# Patient Record
Sex: Female | Born: 1965 | Race: White | Hispanic: No | Marital: Married | Smoking: Former smoker
Health system: Southern US, Community
[De-identification: ages and names within clinical notes are randomized; demographics above are authoritative.]

## PROBLEM LIST (undated history)

## (undated) DIAGNOSIS — G4700 Insomnia, unspecified: Secondary | ICD-10-CM

## (undated) DIAGNOSIS — Z780 Asymptomatic menopausal state: Secondary | ICD-10-CM

## (undated) DIAGNOSIS — G8929 Other chronic pain: Secondary | ICD-10-CM

## (undated) DIAGNOSIS — F988 Other specified behavioral and emotional disorders with onset usually occurring in childhood and adolescence: Secondary | ICD-10-CM

## (undated) DIAGNOSIS — M549 Dorsalgia, unspecified: Principal | ICD-10-CM

## (undated) DIAGNOSIS — F329 Major depressive disorder, single episode, unspecified: Secondary | ICD-10-CM

## (undated) HISTORY — DX: Other chronic pain: G89.29

## (undated) HISTORY — DX: Asymptomatic menopausal state: Z78.0

## (undated) HISTORY — PX: OTHER SURGICAL HISTORY: SHX169

## (undated) HISTORY — DX: Dorsalgia, unspecified: M54.9

## (undated) HISTORY — DX: Major depressive disorder, single episode, unspecified: F32.9

## (undated) HISTORY — PX: BACK SURGERY: SHX140

## (undated) HISTORY — PX: BREAST SURGERY: SHX581

## (undated) HISTORY — PX: AUGMENTATION MAMMAPLASTY: SUR837

## (undated) HISTORY — PX: ABDOMINAL SURGERY: SHX537

## (undated) HISTORY — PX: TONSILLECTOMY: SUR1361

## (undated) HISTORY — DX: Insomnia, unspecified: G47.00

---

## 1998-05-21 ENCOUNTER — Emergency Department (HOSPITAL_COMMUNITY): Admission: EM | Admit: 1998-05-21 | Discharge: 1998-05-21 | Payer: Self-pay | Admitting: Emergency Medicine

## 2004-07-28 ENCOUNTER — Ambulatory Visit (HOSPITAL_COMMUNITY): Admission: RE | Admit: 2004-07-28 | Discharge: 2004-07-28 | Payer: Self-pay | Admitting: Family Medicine

## 2004-12-25 ENCOUNTER — Ambulatory Visit: Payer: Self-pay

## 2005-03-06 ENCOUNTER — Other Ambulatory Visit: Admission: RE | Admit: 2005-03-06 | Discharge: 2005-03-06 | Payer: Self-pay | Admitting: *Deleted

## 2005-10-19 ENCOUNTER — Ambulatory Visit: Payer: Self-pay | Admitting: Internal Medicine

## 2006-02-19 ENCOUNTER — Other Ambulatory Visit: Admission: RE | Admit: 2006-02-19 | Discharge: 2006-02-19 | Payer: Self-pay | Admitting: *Deleted

## 2006-06-05 ENCOUNTER — Ambulatory Visit: Payer: Self-pay | Admitting: General Practice

## 2007-03-25 ENCOUNTER — Other Ambulatory Visit: Admission: RE | Admit: 2007-03-25 | Discharge: 2007-03-25 | Payer: Self-pay | Admitting: *Deleted

## 2007-07-28 ENCOUNTER — Emergency Department (HOSPITAL_COMMUNITY): Admission: EM | Admit: 2007-07-28 | Discharge: 2007-07-29 | Payer: Self-pay | Admitting: Emergency Medicine

## 2008-03-24 ENCOUNTER — Other Ambulatory Visit: Admission: RE | Admit: 2008-03-24 | Discharge: 2008-03-24 | Payer: Self-pay | Admitting: Gynecology

## 2008-09-28 LAB — CBC AND DIFFERENTIAL
HCT: 43 % (ref 36–46)
Hemoglobin: 14.7 g/dL (ref 12.0–16.0)
WBC: 4.6 10^3/mL

## 2008-09-28 LAB — LIPID PANEL
Cholesterol: 181 mg/dL (ref 0–200)
LDL Cholesterol: 90 mg/dL

## 2008-09-28 LAB — THYROID PANEL WITH TSH
T3 Uptake: 42.6
T4, Total: 9.3

## 2008-09-28 LAB — HEPATIC FUNCTION PANEL
ALT: 12 U/L (ref 7–35)
AST: 15 U/L (ref 13–35)
Alkaline Phosphatase: 33 U/L (ref 25–125)
Bilirubin, Total: 0.4 mg/dL

## 2008-09-28 LAB — BASIC METABOLIC PANEL
BUN: 15 mg/dL (ref 4–21)
Glucose: 83 mg/dL
Potassium: 4.3 mmol/L (ref 3.4–5.3)
Sodium: 138 mmol/L (ref 137–147)

## 2008-09-28 LAB — TSH: TSH: 4.64 u[IU]/mL (ref 0.41–5.90)

## 2008-09-28 LAB — THYROID PEROXIDASE (TPO) AB: Thyroid Peroxidase (TPO) Ab: 50

## 2008-09-28 LAB — IRON
Ferritin: 26
Iron: 135

## 2008-09-28 LAB — CBC WITH DIFFERENTIAL
MCV: 93.5
RBC: 14.7

## 2010-04-30 ENCOUNTER — Ambulatory Visit: Payer: Self-pay | Admitting: Emergency Medicine

## 2010-04-30 LAB — CONVERTED CEMR LAB
Bilirubin Urine: NEGATIVE
Glucose, Urine, Semiquant: NEGATIVE
Ketones, urine, test strip: NEGATIVE
Nitrite: NEGATIVE
Protein, U semiquant: NEGATIVE
Specific Gravity, Urine: <1.005
Urobilinogen, UA: 0.2
WBC Urine, dipstick: NEGATIVE
pH: 5.5

## 2010-08-20 ENCOUNTER — Ambulatory Visit: Admit: 2010-08-20 | Payer: Self-pay | Admitting: Family Medicine

## 2010-08-20 ENCOUNTER — Ambulatory Visit
Admission: RE | Admit: 2010-08-20 | Discharge: 2010-08-20 | Payer: Self-pay | Source: Home / Self Care | Admitting: Family Medicine

## 2010-08-22 ENCOUNTER — Telehealth (INDEPENDENT_AMBULATORY_CARE_PROVIDER_SITE_OTHER): Payer: Self-pay | Admitting: *Deleted

## 2010-09-10 ENCOUNTER — Encounter: Payer: Self-pay | Admitting: Family Medicine

## 2010-09-21 NOTE — Progress Notes (Signed)
  Phone Note Outgoing Call   Call placed by: Clemens Catholic LPN,  August 22, 2010 10:30 AM Call placed to: Patient Summary of Call: call back: pt was not @ home spoke to a female who states that the pt was starting to feel better. told her if the pt has any questions or concerns to call back. Initial call taken by: Clemens Catholic LPN,  August 22, 2010 10:31 AM

## 2010-09-21 NOTE — Assessment & Plan Note (Signed)
Summary: FEVER,CHILLS,COUGH,SINUS PROBLEMS,SOB/WSE room 3   Vital Signs:  Patient Profile:   45 Years Old Female CC:      Cold & URI symptoms Height:     64 inches Weight:      149.75 pounds O2 treatment:    Room Air Temp:     100 degrees F oral Pulse rate:   87 / minute Resp:     18 per minute BP sitting:   100 / 63  (left arm) Cuff size:   regular  Vitals Entered By: Clemens Catholic LPN (August 20, 2010 1:20 PM)                  Updated Prior Medication List: AMBIEN 10 MG TABS (ZOLPIDEM TARTRATE)   Current Allergies: No known allergies History of Present Illness Chief Complaint: Cold & URI symptoms History of Present Illness:  Subjective: Patient complains of URI symptoms that started 4 days ago with ears clogged and mild sore throat.  Yesterday and today has developed a non-productive cough.  She often coughs until she gags.  She complains of pressure-like discomfort over anterior chest.   She has not had a tetanus shot in about 10 years. + cough worse at night No pleuritic pain No wheezing + mild nasal congestion + post-nasal drainage No sinus pain/pressure No itchy/red eyes No earache No hemoptysis No SOB + fever/chills No nausea No vomiting No abdominal pain No diarrhea No skin rashes + fatigue No myalgias No headache Used OTC meds without relief   REVIEW OF SYSTEMS Constitutional Symptoms       Complains of fever, chills, night sweats, and fatigue.     Denies weight loss and weight gain.  Eyes       Complains of eye pain.      Denies change in vision, eye discharge, glasses, contact lenses, and eye surgery. Ear/Nose/Throat/Mouth       Complains of sinus problems, sore throat, and hoarseness.      Denies hearing loss/aids, change in hearing, ear pain, ear discharge, dizziness, frequent runny nose, frequent nose bleeds, and tooth pain or bleeding.  Respiratory       Complains of wheezing and shortness of breath.      Denies dry cough, productive  cough, asthma, bronchitis, and emphysema/COPD.  Cardiovascular       Complains of chest pain and tires easily with exhertion.      Denies murmurs.    Gastrointestinal       Denies stomach pain, nausea/vomiting, diarrhea, constipation, blood in bowel movements, and indigestion. Genitourniary       Denies painful urination, kidney stones, and loss of urinary control. Neurological       Complains of headaches.      Denies paralysis, seizures, and fainting/blackouts. Musculoskeletal       Complains of decreased range of motion.      Denies muscle pain, joint pain, joint stiffness, redness, swelling, muscle weakness, and gout.  Skin       Denies bruising, unusual mles/lumps or sores, and hair/skin or nail changes.  Psych       Denies mood changes, temper/anger issues, anxiety/stress, speech problems, depression, and sleep problems. Other Comments: pt c/o fever, chills, cough, HA, loss of appetiite x 5 days. she took a ASA @ 11:45am today. she was seen at [facility] on thursday and was given flonase and cough med. but she states that she did not get them filled.   Past History:  Past Medical History:  Reviewed history from 04/30/2010 and no changes required. Fibroids cyst on uterus  Past Surgical History: Tonsillectomy Rhinoplasty Breast Augmentation fibroid cyst removed 2010 abdominoplasty 2009  Family History: Family History High cholesterol Family History Hypertension Family History of Prostate CA 1st degree relative <50 hemochromatosis diabetes  Social History: Reviewed history from 04/30/2010 and no changes required. Married Never Smoked Alcohol use-yes 1-2 glasses of wine a week Drug use-no Regular exercise-yes   Objective:  Appearance:  Patient appears healthy, stated age, and in no acute distress  Eyes:  Pupils are equal, round, and reactive to light and accomdation.  Extraocular movement is intact.  Conjunctivae are not inflamed.  Ears:  Canals normal.  Tympanic  membranes normal.   Nose:  Normal septum.  Normal turbinates, mildly congested.    No sinus tenderness present.  Pharynx:  Normal  Neck:  Supple.  Slightly tender shotty anterior/posterior nodes are palpated bilaterally.  Lungs:  Clear to auscultation.  Breath sounds are equal.  Chest:  Tenderness to palpation over sternum Heart:  Regular rate and rhythm without murmurs, rubs, or gallops.  Abdomen:  Nontender without masses or hepatosplenomegaly.  Bowel sounds are present.  No CVA or flank tenderness.  Extremities:  No edema.  Pedal pulses are full and equal.  Assessment New Problems: UPPER RESPIRATORY INFECTION, ACUTE (ICD-465.9)  VIRAL URI VS ? PERTUSSIS.  MILD COSTOCHONDRITIS ALSO  Plan New Medications/Changes: BENZONATATE 200 MG CAPS (BENZONATATE) One by mouth hs as needed cough  #12 x 0, 08/20/2010, Donna Christen MD AZITHROMYCIN 250 MG TABS (AZITHROMYCIN) Two tabs by mouth on day 1, then 1 tab daily on days 2 through 5  #6 tabs x 0, 08/20/2010, Donna Christen MD  New Orders: Est. Patient Level III [phone] Planning Comments:   Begin Z-pack, expectorant/decongestant, cough suppressant at bedtime.  Increase fluid intake Followup with PCP if not improving 10 to 14 days  Recommend Tdap when well (she has had a flu shot)   The patient and/or caregiver has been counseled thoroughly with regard to medications prescribed including dosage, schedule, interactions, rationale for use, and possible side effects and they verbalize understanding.  Diagnoses and expected course of recovery discussed and will return if not improved as expected or if the condition worsens. Patient and/or caregiver verbalized understanding.  Prescriptions: BENZONATATE 200 MG CAPS (BENZONATATE) One by mouth hs as needed cough  #12 x 0   Entered and Authorized by:   Donna Christen MD   Signed by:   Donna Christen MD on 08/20/2010   Method used:   Print then Give to Patient   RxID:   [id number] AZITHROMYCIN 250  MG TABS (AZITHROMYCIN) Two tabs by mouth on day 1, then 1 tab daily on days 2 through 5  #6 tabs x 0   Entered and Authorized by:   Donna Christen MD   Signed by:   Donna Christen MD on 08/20/2010   Method used:   Print then Give to Patient   RxID:   [id number]   Patient Instructions: 1)  May use Mucinex D (guaifenesin with decongestant) twice daily for congestion. 2)  Increase fluid intake, rest. 3)  May use Afrin nasal spray (or generic oxymetazoline) twice daily for about 5 days.  Also recommend using saline nasal spray several times daily and/or saline nasal irrigation. 4)  Followup with family doctor if not improving 10 to 14 days.  Orders Added: 1)  Est. Patient Level III [40102]

## 2010-09-21 NOTE — Assessment & Plan Note (Signed)
Summary: Frequent, burning urination x 6 dys rm 5   Vital Signs:  Patient Profile:   45 Years Old Female CC:      Frequent, burning urination x 6 dys LMP:     04/24/2010 Height:     64 inches Weight:      148 pounds O2 Sat:      100 % O2 treatment:    Room Air Temp:     97.9 degrees F oral Pulse rate:   50 / minute Pulse rhythm:   irregular Resp:     16 per minute BP sitting:   116 / 71  (left arm) Cuff size:   regular  Vitals Entered By: Areta Haber CMA (April 30, 2010 2:38 PM)  Menstrual History: LMP (date): 04/24/2010 LMP - Character: normal                  Current Allergies: No known allergies History of Present Illness History from: patient & spouse Chief Complaint: Frequent, burning urination x 6 dys History of Present Illness: Dysuria, burning x a few days.  Also with yeast infx last week, painful intercourse today, urgency.  No frequency.  Mild chills yesterday. Not using any OTC meds except last week to treat yeast infx.    Current Problems: VAGINITIS (ICD-616.10) URINARY TRACT INFECTION (ICD-599.0)   Current Meds BACTRIM DS 800-160 MG TABS (SULFAMETHOXAZOLE-TRIMETHOPRIM) 1 tab by mouth two times a day for 5 days FLUCONAZOLE 150 MG TABS (FLUCONAZOLE) 1 tab by mouth x1.  May repeat in 2-3 days if symptoms are not gone.  REVIEW OF SYSTEMS Constitutional Symptoms      Denies fever, chills, night sweats, weight loss, weight gain, and fatigue.  Eyes       Complains of glasses.      Denies change in vision, eye pain, eye discharge, contact lenses, and eye surgery. Ear/Nose/Throat/Mouth       Denies hearing loss/aids, change in hearing, ear pain, ear discharge, dizziness, frequent runny nose, frequent nose bleeds, sinus problems, sore throat, hoarseness, and tooth pain or bleeding.  Respiratory       Denies dry cough, productive cough, wheezing, shortness of breath, asthma, bronchitis, and emphysema/COPD.  Cardiovascular       Denies murmurs, chest  pain, and tires easily with exhertion.    Gastrointestinal       Denies stomach pain, nausea/vomiting, diarrhea, constipation, blood in bowel movements, and indigestion. Genitourniary       Complains of painful urination.      Denies kidney stones and loss of urinary control.      Comments: Frequent x 6 dys Neurological       Denies paralysis, seizures, and fainting/blackouts. Musculoskeletal       Denies muscle pain, joint pain, joint stiffness, decreased range of motion, redness, swelling, muscle weakness, and gout.  Skin       Denies bruising, unusual mles/lumps or sores, and hair/skin or nail changes.  Psych       Denies mood changes, temper/anger issues, anxiety/stress, speech problems, depression, and sleep problems. Other Comments: Pt has not seen PCP for this. Spouse has had Vasectomy.   Past History:  Past Medical History: Fibroids cyst on uterus  Past Surgical History: Tonsillectomy Rhinoplasty Breast Augmentation  Family History: Family History High cholesterol Family History Hypertension Family History of Prostate CA 1st degree relative <50  Social History: Married Never Smoked Alcohol use-yes 1-2 glasses of wine a week Drug use-no Regular exercise-yes Smoking Status:  never Drug Use:  no Does Patient Exercise:  yes Physical Exam General appearance: well developed, well nourished, no acute distress Chest/Lungs: no rales, wheezes, or rhonchi bilateral, breath sounds equal without effort Heart: regular rate and  rhythm, no murmur Abdomen: soft, non-tender without obvious organomegaly Back: no CVAT Skin: no obvious rashes or lesions MSE: oriented to time, place, and person Assessment New Problems: VAGINITIS (ICD-616.10) URINARY TRACT INFECTION (ICD-599.0)   Patient Education: Patient and/or caregiver instructed in the following: rest, fluids, Tylenol prn.  Plan New Medications/Changes: FLUCONAZOLE 150 MG TABS (FLUCONAZOLE) 1 tab by mouth x1.  May  repeat in 2-3 days if symptoms are not gone.  #2 x 0, 04/30/2010, Hoyt Koch MD BACTRIM DS 800-160 MG TABS (SULFAMETHOXAZOLE-TRIMETHOPRIM) 1 tab by mouth two times a day for 5 days  #10 x 0, 04/30/2010, Hoyt Koch MD  New Orders: UA Dipstick w/o Micro (automated)  [81003] New Patient Level III [99203] T-Culture, Urine [[id number]] Follow Up: Follow up with Primary Physician  The patient and/or caregiver has been counseled thoroughly with regard to medications prescribed including dosage, schedule, interactions, rationale for use, and possible side effects and they verbalize understanding.  Diagnoses and expected course of recovery discussed and will return if not improved as expected or if the condition worsens. Patient and/or caregiver verbalized understanding.  Prescriptions: FLUCONAZOLE 150 MG TABS (FLUCONAZOLE) 1 tab by mouth x1.  May repeat in 2-3 days if symptoms are not gone.  #2 x 0   Entered and Authorized by:   Hoyt Koch MD   Signed by:   Hoyt Koch MD on 04/30/2010   Method used:   Print then Give to Patient   RxID:   [id number] BACTRIM DS 800-160 MG TABS (SULFAMETHOXAZOLE-TRIMETHOPRIM) 1 tab by mouth two times a day for 5 days  #10 x 0   Entered and Authorized by:   Hoyt Koch MD   Signed by:   Hoyt Koch MD on 04/30/2010   Method used:   Print then Give to Patient   RxID:   [id number]   Orders Added: 1)  UA Dipstick w/o Micro (automated)  [81003] 2)  New Patient Level III [99203] 3)  T-Culture, Urine [[id number]]  Laboratory Results   Urine Tests  Date/Time Received: April 30, 2010 2:47 PM  Date/Time Reported: April 30, 2010 2:48 PM   Routine Urinalysis   Color: yellow Appearance: Clear Glucose: negative   (Normal Range: Negative) Bilirubin: negative   (Normal Range: Negative) Ketone: negative   (Normal Range: Negative) Spec. Gravity: <1.005   (Normal Range: 1.003-1.035) Blood: trace-lysed    (Normal Range: Negative) pH: 5.5   (Normal Range: 5.0-8.0) Protein: negative   (Normal Range: Negative) Urobilinogen: 0.2   (Normal Range: 0-1) Nitrite: negative   (Normal Range: Negative) Leukocyte Esterace: negative   (Normal Range: Negative)

## 2011-03-20 ENCOUNTER — Inpatient Hospital Stay (INDEPENDENT_AMBULATORY_CARE_PROVIDER_SITE_OTHER)
Admission: RE | Admit: 2011-03-20 | Discharge: 2011-03-20 | Disposition: A | Discharge status: Home / Self Care | Payer: Self-pay | Source: Ambulatory Visit | Attending: Emergency Medicine | Admitting: Emergency Medicine

## 2011-03-20 ENCOUNTER — Encounter: Payer: Self-pay | Admitting: Emergency Medicine

## 2011-03-20 DIAGNOSIS — S91009A Unspecified open wound, unspecified ankle, initial encounter: Secondary | ICD-10-CM

## 2011-03-20 DIAGNOSIS — S81009A Unspecified open wound, unspecified knee, initial encounter: Secondary | ICD-10-CM

## 2011-03-20 DIAGNOSIS — S81809A Unspecified open wound, unspecified lower leg, initial encounter: Secondary | ICD-10-CM

## 2011-05-28 LAB — URINALYSIS, ROUTINE W REFLEX MICROSCOPIC
Bilirubin Urine: NEGATIVE
Glucose, UA: NEGATIVE
Hgb urine dipstick: NEGATIVE
Ketones, ur: NEGATIVE
Nitrite: NEGATIVE
Protein, ur: NEGATIVE
Specific Gravity, Urine: 1.017
Urobilinogen, UA: 0.2
pH: 5

## 2011-05-28 LAB — PREGNANCY, URINE: Preg Test, Ur: NEGATIVE

## 2011-07-23 NOTE — Progress Notes (Signed)
Summary: LEG PAIN W/FEVER & POSS INFECTION/TJ   Vital Signs:  Patient Profile:   45 Years Old Female CC:      cut on left leg, possible infection x 9 days Height:     64 inches Weight:      149 pounds O2 Sat:      96 % O2 treatment:    Room Air Temp:     98.2 degrees F oral Pulse rate:   50 / minute Resp:     16 per minute BP sitting:   103 / 65  (left arm) Cuff size:   regular  Vitals Entered By: Clemens Catholic LPN (March 20, 2011 10:08 AM)                  Updated Prior Medication List: No Medications Current Allergies (reviewed today): No known allergies History of Present Illness History from: patient & husband Chief Complaint: cut on left leg, possible infection x 9 days History of Present Illness: 9 days ago [place] on the [facility] and she scraped her L shin on a rock.  It didn't bleed much, and since they have kept it clean.  It is healing up well.  However she woke up today and had subjective F/C and got concerned that she had an infection.  Did have mild drainage last week from the wound.   REVIEW OF SYSTEMS Constitutional Symptoms       Complains of chills.     Denies fever, night sweats, weight loss, weight gain, and fatigue.  Eyes       Denies change in vision, eye pain, eye discharge, glasses, contact lenses, and eye surgery. Ear/Nose/Throat/Mouth       Denies hearing loss/aids, change in hearing, ear pain, ear discharge, dizziness, frequent runny nose, frequent nose bleeds, sinus problems, sore throat, hoarseness, and tooth pain or bleeding.  Respiratory       Denies dry cough, productive cough, wheezing, shortness of breath, asthma, bronchitis, and emphysema/COPD.  Cardiovascular       Denies murmurs, chest pain, and tires easily with exhertion.    Gastrointestinal       Denies stomach pain, nausea/vomiting, diarrhea, constipation, blood in bowel movements, and indigestion. Genitourniary       Denies painful urination, kidney stones, and loss of  urinary control. Neurological       Complains of headaches.      Denies paralysis, seizures, and fainting/blackouts. Musculoskeletal       Complains of redness and swelling.      Denies muscle pain, joint pain, joint stiffness, decreased range of motion, muscle weakness, and gout.  Skin       Denies bruising, unusual mles/lumps or sores, and hair/skin or nail changes.  Psych       Denies mood changes, temper/anger issues, anxiety/stress, speech problems, depression, and sleep problems. Other Comments: pt states that she [place] 9 [place]. she c/o chills, ? fever x today.    Past History:  Past Medical History: Reviewed history from 04/30/2010 and no changes required. Fibroids cyst on uterus  Past Surgical History: Reviewed history from 08/20/2010 and no changes required. Tonsillectomy Rhinoplasty Breast Augmentation fibroid cyst removed 2010 abdominoplasty 2009  Family History: Reviewed history from 08/20/2010 and no changes required. Family History High cholesterol Family History Hypertension Family History of Prostate CA 1st degree relative <50 hemochromatosis diabetes  Social History: Reviewed history from 04/30/2010 and no changes required. Married Never  Smoked Alcohol use-yes 1-2 glasses of wine a week Drug use-no Regular exercise-yes Physical Exam General appearance: well developed, well nourished, no acute distress MSE: oriented to time, place, and person L anterior mid-shin is a healing vertical wound approx 4cm with pink surrounding granulation tissue and a scar over the wound.  It doesn't necessarily look infected, is not warm, no drainage, minimal tenderness.  Distal pulses and sensation is normal.  Normal gait. Assessment New Problems: WOUND, OPEN, LEG, WITHOUT COMPLICATION (ICD-891.0)   Plan New Medications/Changes: DOXYCYCLINE HYCLATE 100 MG CAPS (DOXYCYCLINE HYCLATE) 1 by mouth two times a day for 10 days  #20 x 0,  03/20/2011, Hoyt Koch MD  New Orders: Est. Patient Level II [phone] Planning Comments:   L leg wound, possibly small amount of cellulitis, but likely just granulation tissue and healing well.  Will give Rx for Doxy since they are concerned with infection.  Caution with photosensitivity.  Keep clean and dry.  If worsening symptoms such as F/C, pus, drainage, more pain, will need to follow up with her PCP.   The patient and/or caregiver has been counseled thoroughly with regard to medications prescribed including dosage, schedule, interactions, rationale for use, and possible side effects and they verbalize understanding.  Diagnoses and expected course of recovery discussed and will return if not improved as expected or if the condition worsens. Patient and/or caregiver verbalized understanding.  Prescriptions: DOXYCYCLINE HYCLATE 100 MG CAPS (DOXYCYCLINE HYCLATE) 1 by mouth two times a day for 10 days  #20 x 0   Entered and Authorized by:   Hoyt Koch MD   Signed by:   Hoyt Koch MD on 03/20/2011   Method used:   Print then Give to Patient   RxID:   [id number]   Orders Added: 1)  Est. Patient Level II [95621]

## 2012-01-10 ENCOUNTER — Encounter: Payer: Self-pay | Admitting: Emergency Medicine

## 2012-01-10 ENCOUNTER — Emergency Department (INDEPENDENT_AMBULATORY_CARE_PROVIDER_SITE_OTHER)
Admission: EM | Admit: 2012-01-10 | Discharge: 2012-01-10 | Disposition: A | Discharge status: Home / Self Care | Payer: 59 | Source: Home / Self Care | Attending: Emergency Medicine | Admitting: Emergency Medicine

## 2012-01-10 DIAGNOSIS — J029 Acute pharyngitis, unspecified: Secondary | ICD-10-CM

## 2012-01-10 LAB — POCT RAPID STREP A (OFFICE): Rapid Strep A Screen: NEGATIVE

## 2012-01-10 NOTE — ED Notes (Signed)
Sore throat started today

## 2012-01-10 NOTE — ED Provider Notes (Signed)
History     CSN: [id number]  Arrival date & time 01/10/12  1607   First MD Initiated Contact with Patient 01/10/12 1610      Chief Complaint  Patient presents with  . Sore Throat    (Consider location/radiation/quality/duration/timing/severity/associated sxs/prior treatment) HPI Renee Norris is a 46 y.o. female who complains of onset of cold symptoms for 1 day.  The symptoms are constant and mild-moderate in severity.  She has recently been around a friend who possibly had strep throat and would like to get checked for that. + sore throat No cough No pleuritic pain No wheezing No nasal congestion + post-nasal drainage No sinus pain/pressure No chest congestion No itchy/red eyes No earache No hemoptysis No SOB No chills/sweats No fever No nausea No vomiting No abdominal pain No diarrhea No skin rashes + fatigue No myalgias No headache    History reviewed. No pertinent past medical history.  Past Surgical History  Procedure Date  . Breast surgery   . Tonsillectomy   . Gyn surgery     Family History  Problem Relation Age of Onset  . Diabetes Mother   . Hypertension Mother   . Diabetes Father   . Hypertension Father     History  Substance Use Topics  . Smoking status: Former Games developer  . Smokeless tobacco: Not on file  . Alcohol Use: 0.6 oz/week    1 Glasses of wine per week    OB History    Grav Para Term Preterm Abortions TAB SAB Ect Mult Living                  Review of Systems  All other systems reviewed and are negative.    Allergies  Review of patient's allergies indicates no known allergies.  Home Medications   Current Outpatient Rx  Name Route Sig Dispense Refill  . ZOLPIDEM TARTRATE 5 MG PO TABS Oral Take 5 mg by mouth at bedtime as needed.      BP 100/65  Pulse 61  Temp(Src) 97.9 F (36.6 C) (Oral)  Resp 12  Ht 5\' 4"  (1.626 m)  Wt 155 lb (70.308 kg)  BMI 26.61 kg/m2  SpO2 97%  Physical Exam  Nursing note and vitals  reviewed. Constitutional: She is oriented to person, place, and time. She appears well-developed and well-nourished.  HENT:  Head: Normocephalic and atraumatic.  Right Ear: Tympanic membrane, external ear and ear canal normal.  Left Ear: Tympanic membrane, external ear and ear canal normal.  Nose: Mucosal edema present.  Mouth/Throat: No oropharyngeal exudate, posterior oropharyngeal edema or posterior oropharyngeal erythema.         Moderate cerumen right ear  Eyes: No scleral icterus.  Neck: Neck supple.  Cardiovascular: Regular rhythm and normal heart sounds.   Pulmonary/Chest: Effort normal and breath sounds normal. No respiratory distress.  Neurological: She is alert and oriented to person, place, and time.  Skin: Skin is warm and dry.  Psychiatric: She has a normal mood and affect. Her speech is normal.    ED Course  Procedures (including critical care time)   Labs Reviewed  POCT RAPID STREP A (OFFICE)  STREP A DNA PROBE   No results found.   1. Acute pharyngitis       MDM  1)  rapid strep test is negative.  Throat culture is pending.  Patient to call back if developing worse symptoms we can call in an antibiotic at that time. 2)  Use nasal saline solution (over the  counter) at least 3 times a day. 3)  Use over the counter decongestants like Zyrtec-D every 12 hours as needed to help with congestion.  If you have hypertension, do not take medicines with sudafed.  4)  Can take tylenol every 6 hours or motrin every 8 hours for pain or fever. 5)  Follow up with your primary doctor if no improvement in 5-7 days, sooner if increasing pain, fever, or new symptoms.     Marlaine Hind, MD 01/10/12 [phone]

## 2012-01-11 LAB — STREP A DNA PROBE: GASP: NEGATIVE

## 2012-01-12 ENCOUNTER — Telehealth: Payer: Self-pay | Admitting: Emergency Medicine

## 2012-01-31 ENCOUNTER — Emergency Department (INDEPENDENT_AMBULATORY_CARE_PROVIDER_SITE_OTHER)
Admission: EM | Admit: 2012-01-31 | Discharge: 2012-01-31 | Disposition: A | Discharge status: Home / Self Care | Payer: 59 | Source: Home / Self Care | Attending: Emergency Medicine | Admitting: Emergency Medicine

## 2012-01-31 ENCOUNTER — Encounter: Payer: Self-pay | Admitting: *Deleted

## 2012-01-31 DIAGNOSIS — J029 Acute pharyngitis, unspecified: Secondary | ICD-10-CM

## 2012-01-31 LAB — POCT RAPID STREP A (OFFICE): Rapid Strep A Screen: NEGATIVE

## 2012-01-31 MED ORDER — AZITHROMYCIN 250 MG PO TABS: ORAL_TABLET | ORAL | Status: AC

## 2012-01-31 NOTE — ED Provider Notes (Signed)
History     CSN: [id number]  Arrival date & time 01/31/12  1478   First MD Initiated Contact with Patient 01/31/12 [phone]      Chief Complaint  Patient presents with  . Sore Throat    (Consider location/radiation/quality/duration/timing/severity/associated sxs/prior treatment) HPI Renee Norris is a 46 y.o. female who complains of onset of ST.  Was sick 2 weeks ago, neg strep, got better after a week.  Then 4 days later, symptoms returned and are worse.  The symptoms are constant and mild-moderate in severity. + sore throat No cough No pleuritic pain No wheezing No nasal congestion No post-nasal drainage No sinus pain/pressure No chest congestion No itchy/red eyes No earache No hemoptysis No SOB No chills/sweats + fever No nausea No vomiting No abdominal pain No diarrhea No skin rashes + fatigue + myalgias + headache    History reviewed. No pertinent past medical history.  Past Surgical History  Procedure Date  . Breast surgery   . Tonsillectomy   . Gyn surgery     Family History  Problem Relation Age of Onset  . Diabetes Mother   . Hypertension Mother   . Diabetes Father   . Hypertension Father     History  Substance Use Topics  . Smoking status: Former Games developer  . Smokeless tobacco: Not on file  . Alcohol Use: 0.6 oz/week    1 Glasses of wine per week    OB History    Grav Para Term Preterm Abortions TAB SAB Ect Mult Living                  Review of Systems  All other systems reviewed and are negative.    Allergies  Review of patient's allergies indicates no known allergies.  Home Medications   Current Outpatient Rx  Name Route Sig Dispense Refill  . AZITHROMYCIN 250 MG PO TABS  Use as directed 1 each 0  . ZOLPIDEM TARTRATE 5 MG PO TABS Oral Take 5 mg by mouth at bedtime as needed.      BP 112/74  Pulse 83  Temp 99 F (37.2 C) (Oral)  Resp 16  Ht 5' 4.5" (1.638 m)  Wt 154 lb (69.854 kg)  BMI 26.03 kg/m2  SpO2 100%  LMP  01/10/2012  Physical Exam  Nursing note and vitals reviewed. Constitutional: She is oriented to person, place, and time. She appears well-developed and well-nourished.  HENT:  Head: Normocephalic and atraumatic.  Right Ear: Tympanic membrane, external ear and ear canal normal.  Left Ear: Tympanic membrane, external ear and ear canal normal.  Nose: Nose normal.  Mouth/Throat: Posterior oropharyngeal erythema present. No oropharyngeal exudate or posterior oropharyngeal edema.       S/p tonsillectomy  Eyes: No scleral icterus.  Neck: Neck supple.  Cardiovascular: Regular rhythm and normal heart sounds.   Pulmonary/Chest: Effort normal and breath sounds normal. No respiratory distress.  Neurological: She is alert and oriented to person, place, and time.  Skin: Skin is warm and dry.  Psychiatric: She has a normal mood and affect. Her speech is normal.    ED Course  Procedures (including critical care time)   Labs Reviewed  POCT RAPID STREP A (OFFICE)   No results found.   1. Acute pharyngitis       MDM  1)  Take the prescribed antibiotic as instructed. Rapid strep negative. 2)  Use nasal saline solution (over the counter) at least 3 times a day. 3)  Use over the counter  decongestants like Zyrtec-D every 12 hours as needed to help with congestion.  If you have hypertension, do not take medicines with sudafed.  4)  Can take tylenol every 6 hours or motrin every 8 hours for pain or fever. 5)  Follow up with your primary doctor if no improvement in 5-7 days, sooner if increasing pain, fever, or new symptoms.     Marlaine Hind, MD 01/31/12 [phone]

## 2012-01-31 NOTE — ED Notes (Signed)
Patient c/o sore throat and fatigue x 2 weeks.

## 2012-09-08 ENCOUNTER — Ambulatory Visit (HOSPITAL_BASED_OUTPATIENT_CLINIC_OR_DEPARTMENT_OTHER)
Admit: 2012-09-08 | Discharge: 2012-09-08 | Disposition: A | Discharge status: Home / Self Care | Payer: 59 | Source: Ambulatory Visit | Attending: Family Medicine | Admitting: Family Medicine

## 2012-09-08 ENCOUNTER — Encounter: Payer: Self-pay | Admitting: Emergency Medicine

## 2012-09-08 ENCOUNTER — Emergency Department
Admission: EM | Admit: 2012-09-08 | Discharge: 2012-09-08 | Disposition: A | Discharge status: Home / Self Care | Payer: 59 | Source: Home / Self Care | Attending: Family Medicine | Admitting: Family Medicine

## 2012-09-08 DIAGNOSIS — M549 Dorsalgia, unspecified: Secondary | ICD-10-CM

## 2012-09-08 DIAGNOSIS — R109 Unspecified abdominal pain: Secondary | ICD-10-CM | POA: Insufficient documentation

## 2012-09-08 DIAGNOSIS — R339 Retention of urine, unspecified: Secondary | ICD-10-CM | POA: Insufficient documentation

## 2012-09-08 DIAGNOSIS — R5381 Other malaise: Secondary | ICD-10-CM

## 2012-09-08 DIAGNOSIS — R5383 Other fatigue: Secondary | ICD-10-CM

## 2012-09-08 LAB — POCT URINALYSIS DIP (MANUAL ENTRY)
Bilirubin, UA: NEGATIVE
Glucose, UA: NEGATIVE
Ketones, POC UA: NEGATIVE
Leukocytes, UA: NEGATIVE
Nitrite, UA: NEGATIVE
Protein Ur, POC: NEGATIVE
Spec Grav, UA: <=1.005 (ref 1.005–1.03)
Urobilinogen, UA: 0.2 (ref 0–1)
pH, UA: 5.5 (ref 5–8)

## 2012-09-08 MED ORDER — IOHEXOL 300 MG/ML  SOLN
100.0000 mL | Freq: Once | INTRAMUSCULAR | Status: AC | PRN
  Administered 2012-09-08: 100 mL via INTRAVENOUS

## 2012-09-08 NOTE — ED Provider Notes (Signed)
History     CSN: [id number]  Arrival date & time 09/08/12  1203   First MD Initiated Contact with Patient 09/08/12 1207      Chief Complaint  Patient presents with  . Fatigue  . Mass  . Generalized Body Aches  . Urinary Retention   HPI This is a 47 year old otherwise healthy female presents today with multiple nonspecific complaints have been present over the past 3 months. Predominantly patient states she's had decreased urine output where she's only been taking up to twice per day. Patient states a hair fluid drinking has not changed. She has been drinking up to a gallon of water per day and. Patient states that prior to 3 months ago she was peeing up to 4-5 times per day. 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 Patient denies any new medications. Patient does report that she does have a very high amount of tobacco exposure at her job. Patient also reports some right-sided lower back pain has been present for the same time frame. Back pain is predominantly in the paraspinal area and is not related to position, but rather is constant with no radicular component. Patient does report some generalized fatigue over the same time frame. Patient denies any night sweats, fevers chills or unintentional weight loss. Patient does report a family history of pancreatic cancer in her father. Patient also reports that her mood has been depressed over the past few months and this is been work related. Patient feels that she has been under high amount of stress at work. Patient also reports a prior history of depression that she's currently not being treated for. Patient denies any homicidal or suicidal ideations currently. Patient is unsure if current symptoms are related to mood and stress. From a symptomatic standpoint patient denies any headache, loss of vision, hemiparesis or confusion,  shortness of breath or chest pain. Patient denies any change in bowel habits. No diarrhea, bloody stools, no dysuria. Patient has noticed some mild lotion the swelling and generalized bloating over the same time frame that improves with urination per patient. Patient also reports increased menses over the past 2 months with having up to 2 mattress cycle per month. Patient states she has not seen her gynecologist about this issue. Patient does report a prior history of ovarian cysts that have been removed surgically removed in the past. Notable medical history is most significant for surgical procedures including breast augmentation, Maisie Fus a, tonsillectomy, and ovarian cyst removal. Patient states that she has not spoken to her primary care doctor or her gynecologist about these issues. History reviewed. No pertinent past medical history.  Past Surgical History  Procedure Date  . Breast surgery   . Tonsillectomy   . Gyn surgery   . Abdominal surgery     Family History  Problem Relation Age of Onset  . Diabetes Mother   . Hypertension Mother   . Diabetes Father   . Hypertension Father     History  Substance Use Topics  . Smoking status: Former Games developer  . Smokeless tobacco: Not on file  . Alcohol Use: No    OB History    Grav Para Term Preterm Abortions TAB SAB Ect Mult Living                  Review of Systems Complete 12 point review systems negative except  as noted above in history of present illness. Allergies  Review of patient's allergies indicates no known allergies.  Home Medications   Current Outpatient Rx  Name  Route  Sig  Dispense  Refill  . ASPIRIN 81 MG PO TABS   Oral   Take 81 mg by mouth daily.         Marland Kitchen ZOLPIDEM TARTRATE 5 MG PO TABS   Oral   Take 5 mg by mouth at bedtime as needed.           BP 114/72  Pulse 60  Temp 97.6 F (36.4 C) (Oral)  Resp 16  Ht 5' 4.5" (1.638 m)  Wt 158 lb (71.668 kg)  BMI 26.70 kg/m2  SpO2 100%  LMP  08/22/2012  Physical Exam  Constitutional: She appears well-developed and well-nourished. No distress.  HENT:  Head: Normocephalic and atraumatic.  Right Ear: External ear normal.  Left Ear: External ear normal.  Eyes: Conjunctivae normal are normal. Pupils are equal, round, and reactive to light.  Neck: Normal range of motion. Neck supple.  Cardiovascular: Normal rate, regular rhythm and normal heart sounds.   No murmur heard. Pulmonary/Chest: Effort normal and breath sounds normal. She has no wheezes. She has no rales.  Abdominal: Soft.       Mild abdominal TTP diffusely across abdomen.    Musculoskeletal:       Arms: Lymphadenopathy:    She has no cervical adenopathy.  Neurological: She is alert.  Skin: Skin is warm.    ED Course  Procedures (including critical care time)   Labs Reviewed  POCT URINALYSIS DIP (MANUAL ENTRY)  POCT CBC W AUTO DIFF ([facility])  COMPREHENSIVE METABOLIC PANEL  TSH  SEDIMENTATION RATE  PRO B NATRIURETIC PEPTIDE  VITAMIN B12  VITAMIN D 1,25 DIHYDROXY   No results found.   1. Abdominal  pain, other specified site   2. Fatigue   3. Back pain       MDM  Overall, symptoms are fairly broad and nonspecific. However, given abdominal pain and current complaints will obtain a CT of the abdomen and pelvis with IV and oral contrast to better assess abdominal and pelvic anatomy. We'll also obtain a lumbar spine to evaluate low back pain. We'll check baseline labs including CBC, CMET, TSH, BNP, sedimentation rate, vitamin D and vitamin B12 level. I suspect there may be a strong component of depression/stress related to patient symptoms as onset of symptoms seem to be correlated with work and worsening stress at work as well as prior history of depression that is currently not being treated.  Discussed with patient followup with PCP about this issue. Also discussed followup with her gynecologist about increased menstruation as well as  urology follow for decreased urine output. We'll hold on treatment in the interim as patient is overall well-appearing pending further workup. General red flags were discussed.    The patient and/or caregiver has been counseled thoroughly with regard to treatment plan and/or medications prescribed including dosage, schedule, interactions, rationale for use, and possible side effects and they verbalize understanding. Diagnoses and expected course of recovery discussed and will return if not improved as expected or if the condition worsens. Patient and/or caregiver verbalized understanding.             Doree Albee, MD 09/08/12 1341

## 2012-09-08 NOTE — ED Notes (Signed)
Patient has assorted concerns: severe fatigue; a lump on right lower leg that is not resolving post injury; urine output not equivalent to p.o. Intake; general aches and some dizziness.

## 2012-09-09 ENCOUNTER — Telehealth: Payer: Self-pay | Admitting: Family Medicine

## 2012-09-09 LAB — PRO B NATRIURETIC PEPTIDE: Pro B Natriuretic peptide (BNP): 47.98 pg/mL (ref <126)

## 2012-09-09 LAB — COMPREHENSIVE METABOLIC PANEL
ALT: 13 U/L (ref 0–35)
AST: 21 U/L (ref 0–37)
Albumin: 4.3 g/dL (ref 3.5–5.2)
Alkaline Phosphatase: 39 U/L (ref 39–117)
BUN: 13 mg/dL (ref 6–23)
CO2: 21 mEq/L (ref 19–32)
Calcium: 9.1 mg/dL (ref 8.4–10.5)
Chloride: 105 mEq/L (ref 96–112)
Creat: 0.79 mg/dL (ref 0.50–1.10)
Glucose, Bld: 79 mg/dL (ref 70–99)
Potassium: 4.4 mEq/L (ref 3.5–5.3)
Sodium: 138 mEq/L (ref 135–145)
Total Bilirubin: 0.3 mg/dL (ref 0.3–1.2)
Total Protein: 6.7 g/dL (ref 6.0–8.3)

## 2012-09-09 LAB — TSH: TSH: 2.287 u[IU]/mL (ref 0.350–4.500)

## 2012-09-09 LAB — SEDIMENTATION RATE: Sed Rate: 9 mm/hr (ref 0–22)

## 2012-09-09 LAB — VITAMIN B12: Vitamin B-12: 370 pg/mL (ref 211–911)

## 2012-09-09 NOTE — ED Notes (Signed)
Called and spoke with pt about lab and imaging results thus far.   Ct Abdomen Pelvis W Contrast  09/08/2012  *RADIOLOGY REPORT*  Clinical Data: Back pain and urinary retention.  CT ABDOMEN AND PELVIS WITH CONTRAST  Technique:  Multidetector CT imaging of the abdomen and pelvis was performed following the standard protocol during bolus administration of intravenous contrast.  Contrast: OMNIPAQUE IOHEXOL 300 MG/ML  SOLN  Comparison: None.  Findings: Lung bases demonstrate mild dependent atelectasis.  No pleural or pericardial effusion.  Breast implants are noted.  Heart size is normal.  The gallbladder, liver, spleen, adrenal glands, spleen, pancreas, biliary tree and kidneys appear normal.  The stomach, small and large bowel and appendix appear normal.  Uterus and adnexa are unremarkable.  Small uterine fibroids seen on comparison ultrasound are not well demonstrated today.  There is no lymphadenopathy or fluid.  No focal bony abnormality.  IMPRESSION: Negative exam.   Original Report Authenticated By: Holley Dexter, M.D.    Also discussed lab results negative thus far.  Instructed pt to follow up with Gyn and PCP. If urinary sxs return or persist, follow up with urology. Pt expressed understanding.      Doree Albee, MD 09/09/12 [phone]

## 2012-09-12 LAB — VITAMIN D 1,25 DIHYDROXY
Vitamin D 1, 25 (OH)2 Total: 49 pg/mL (ref 18–72)
Vitamin D2 1, 25 (OH)2: <8 pg/mL
Vitamin D3 1, 25 (OH)2: 49 pg/mL

## 2015-01-05 DIAGNOSIS — Z0000 Encounter for general adult medical examination without abnormal findings: Secondary | ICD-10-CM | POA: Insufficient documentation

## 2015-02-03 LAB — HM COLONOSCOPY

## 2016-01-11 ENCOUNTER — Other Ambulatory Visit: Payer: Self-pay | Admitting: Orthopaedic Surgery

## 2016-01-11 DIAGNOSIS — M545 Low back pain: Secondary | ICD-10-CM

## 2016-01-15 ENCOUNTER — Ambulatory Visit
Admission: RE | Admit: 2016-01-15 | Discharge: 2016-01-15 | Disposition: A | Discharge status: Home / Self Care | Payer: Self-pay | Source: Ambulatory Visit | Attending: Orthopaedic Surgery | Admitting: Orthopaedic Surgery

## 2016-01-15 DIAGNOSIS — M545 Low back pain: Secondary | ICD-10-CM

## 2016-01-20 ENCOUNTER — Emergency Department
Admission: EM | Admit: 2016-01-20 | Discharge: 2016-01-20 | Disposition: A | Discharge status: Home / Self Care | Payer: BLUE CROSS/BLUE SHIELD | Source: Home / Self Care | Attending: Family Medicine | Admitting: Family Medicine

## 2016-01-20 DIAGNOSIS — R51 Headache: Secondary | ICD-10-CM

## 2016-01-20 DIAGNOSIS — R519 Headache, unspecified: Secondary | ICD-10-CM

## 2016-01-20 DIAGNOSIS — R112 Nausea with vomiting, unspecified: Secondary | ICD-10-CM

## 2016-01-20 MED ORDER — DEXAMETHASONE SODIUM PHOSPHATE 10 MG/ML IJ SOLN
10.0000 mg | Freq: Once | INTRAMUSCULAR | Status: AC
  Administered 2016-01-20: 10 mg via INTRAMUSCULAR

## 2016-01-20 MED ORDER — ONDANSETRON HCL 4 MG PO TABS
4.0000 mg | ORAL_TABLET | Freq: Once | ORAL | Status: AC
  Administered 2016-01-20: 4 mg via ORAL

## 2016-01-20 MED ORDER — ONDANSETRON 4 MG PO TBDP: ORAL_TABLET | ORAL | Status: DC

## 2016-01-20 NOTE — ED Provider Notes (Signed)
CSN: [id number]     Arrival date & time 01/20/16  1601 History   First MD Initiated Contact with Patient 01/20/16 1635     Chief Complaint  Patient presents with  . Headache   (Consider location/radiation/quality/duration/timing/severity/associated sxs/prior Treatment) HPI  The pt is a 50yo [facility] with [facility]/o Right sided headache that is aching and sharp, worse behind Right eye, associated nausea, vomiting x4 and 2-3 episodes of loose stool since around 9AM.  She has been able to take ibuprofen around 10AM and aspirin at 3PM but vomited Gatorade earlier today.  She reports starting on hormone replacement about 3 weeks ago and recalls having similar headaches when she use to have her menstrual cycle.  Denies recent head injury. Denies weakness or numbness.    History reviewed. No pertinent past medical history. Past Surgical History  Procedure Laterality Date  . Breast surgery    . Tonsillectomy    . Gyn surgery    . Abdominal surgery     Family History  Problem Relation Age of Onset  . Diabetes Mother   . Hypertension Mother   . Cancer Mother   . Diabetes Father   . Hypertension Father   . Cancer Father    Social History  Substance Use Topics  . Smoking status: Former Games developermoker  . Smokeless tobacco: None  . Alcohol Use: No     Comment: 2-3 glasses of wine a day   OB History    No data available     Review of Systems  Constitutional: Negative for fever and chills.  HENT: Negative for congestion, ear pain, sore throat, trouble swallowing and voice change.   Eyes: Positive for photophobia. Negative for pain, discharge, redness, itching and visual disturbance.  Respiratory: Negative for cough and shortness of breath.   Cardiovascular: Negative for chest pain and palpitations.  Gastrointestinal: Positive for nausea, vomiting and diarrhea ( "loose stool"). Negative for abdominal pain.  Musculoskeletal: Negative for myalgias, back pain and arthralgias.  Skin:  Negative for rash.  Neurological: Positive for headaches. Negative for dizziness, seizures, syncope, weakness, light-headedness and numbness.    Allergies  Review of patient's allergies indicates no known allergies.  Home Medications   Prior to Admission medications   Medication Sig Start Date End Date Taking? Authorizing Provider  aspirin 81 MG tablet Take 81 mg by mouth daily.    Historical Provider, MD  ondansetron (ZOFRAN ODT) 4 MG disintegrating tablet 4mg  ODT q4 hours prn nausea/vomit 01/20/16   Junius FinnerErin O'Malley, PA-C  zolpidem (AMBIEN) 5 MG tablet Take 5 mg by mouth at bedtime as needed.    Historical Provider, MD   Meds Ordered and Administered this Visit   Medications  ondansetron (ZOFRAN) tablet 4 mg (4 mg Oral Given 01/20/16 1630)  dexamethasone (DECADRON) injection 10 mg (10 mg Intramuscular Given 01/20/16 1653)    BP 108/70 mmHg  Pulse 57  Temp(Src) 98.3 F (36.8 C) (Oral)  Ht 5\' 4"  (1.626 m)  Wt 158 lb (71.668 kg)  BMI 27.11 kg/m2  SpO2 96%  LMP 04/21/2014 (Approximate) No data found.   Physical Exam  Constitutional: She is oriented to person, place, and time. She appears well-developed and well-nourished. No distress.  HENT:  Head: Normocephalic and atraumatic.  Right Ear: Tympanic membrane normal.  Left Ear: Tympanic membrane normal.  Nose: Nose normal.  Mouth/Throat: Uvula is midline, oropharynx is clear and moist and mucous membranes are normal.  Eyes: Conjunctivae and EOM are normal. Pupils are equal,  round, and reactive to light. No scleral icterus.  Neck: Normal range of motion. Neck supple.  Cardiovascular: Normal rate, regular rhythm and normal heart sounds.   Pulmonary/Chest: Effort normal and breath sounds normal. No respiratory distress. She has no wheezes. She has no rales.  Abdominal: Soft. She exhibits no distension. There is no tenderness.  Musculoskeletal: Normal range of motion.  Neurological: She is alert and oriented to person, place, and time.  No cranial nerve deficit. Coordination normal.  CN II-XII in tact. Speech is clear. Alert to person place and time. Normal coordination. Normal gait.  Skin: Skin is warm and dry. She is not diaphoretic.  Psychiatric: She has a normal mood and affect. Her behavior is normal.  Nursing note and vitals reviewed.   ED Course  Procedures (including critical care time)  Labs Review Labs Reviewed - No data to display  Imaging Review No results found.  MDM   1. Right-sided headache   2. Non-intractable vomiting with nausea, unspecified vomiting type    Pt c/o HA c/o prior headaches when she had menstrual cycles.  No focal neuro deficit.  Pt did have aspirin "1 regular size one" at 3PM  Tx in UC: Zofran  ODT, Decadron  IM  Pt given diet Coke to drink and allowed to rest in cool darkened room.  Headache resolved, improving from 4/10 to 0/10. She feels comfortable being discharged home. Rx: Zofran Encouraged to f/u with PCP and discuss headaches if they keep recurring while on HRT. Discussed symptoms that warrant emergent care in the ED. Patient verbalized understanding and agreement with treatment plan.     Junius Finner, PA-C 01/20/16 [place] Fruit

## 2016-01-20 NOTE — ED Notes (Signed)
Pt started with headache, vomiting and diarrhea this am around 9.  Stated that she can not keep anything down, however was able to take ibuprofen at 10am, and aspirin at 3pm.  She was able to keep both of those down.  The pain is over the right eye.

## 2016-01-22 ENCOUNTER — Telehealth: Payer: Self-pay | Admitting: Emergency Medicine

## 2016-01-30 ENCOUNTER — Encounter: Payer: Self-pay | Admitting: Osteopathic Medicine

## 2016-01-30 ENCOUNTER — Ambulatory Visit (INDEPENDENT_AMBULATORY_CARE_PROVIDER_SITE_OTHER): Payer: BLUE CROSS/BLUE SHIELD | Admitting: Osteopathic Medicine

## 2016-01-30 VITALS — BP 107/71 | HR 55 | Ht 64.0 in | Wt 160.0 lb

## 2016-01-30 DIAGNOSIS — Z13220 Encounter for screening for lipoid disorders: Secondary | ICD-10-CM

## 2016-01-30 DIAGNOSIS — Z780 Asymptomatic menopausal state: Secondary | ICD-10-CM | POA: Insufficient documentation

## 2016-01-30 DIAGNOSIS — F329 Major depressive disorder, single episode, unspecified: Secondary | ICD-10-CM

## 2016-01-30 DIAGNOSIS — G4700 Insomnia, unspecified: Secondary | ICD-10-CM | POA: Diagnosis not present

## 2016-01-30 DIAGNOSIS — G8929 Other chronic pain: Secondary | ICD-10-CM

## 2016-01-30 DIAGNOSIS — F32A Depression, unspecified: Secondary | ICD-10-CM

## 2016-01-30 DIAGNOSIS — R5383 Other fatigue: Secondary | ICD-10-CM

## 2016-01-30 DIAGNOSIS — M549 Dorsalgia, unspecified: Secondary | ICD-10-CM

## 2016-01-30 DIAGNOSIS — R51 Headache: Secondary | ICD-10-CM | POA: Diagnosis not present

## 2016-01-30 DIAGNOSIS — Z79899 Other long term (current) drug therapy: Secondary | ICD-10-CM

## 2016-01-30 DIAGNOSIS — R519 Headache, unspecified: Secondary | ICD-10-CM

## 2016-01-30 HISTORY — DX: Asymptomatic menopausal state: Z78.0

## 2016-01-30 HISTORY — DX: Depression, unspecified: F32.A

## 2016-01-30 HISTORY — DX: Other chronic pain: G89.29

## 2016-01-30 HISTORY — DX: Insomnia, unspecified: G47.00

## 2016-01-30 LAB — CBC WITH DIFFERENTIAL/PLATELET
Basophils Absolute: 44 cells/uL (ref 0–200)
Basophils Relative: 1 %
Eosinophils Absolute: 44 cells/uL (ref 15–500)
Eosinophils Relative: 1 %
HCT: 41.1 % (ref 35.0–45.0)
Hemoglobin: 13.7 g/dL (ref 11.7–15.5)
Lymphocytes Relative: 50 %
Lymphs Abs: 2200 cells/uL (ref 850–3900)
MCH: 31.4 pg (ref 27.0–33.0)
MCHC: 33.3 g/dL (ref 32.0–36.0)
MCV: 94.3 fL (ref 80.0–100.0)
MPV: 9.6 fL (ref 7.5–12.5)
Monocytes Absolute: 396 cells/uL (ref 200–950)
Monocytes Relative: 9 %
Neutro Abs: 1716 cells/uL (ref 1500–7800)
Neutrophils Relative %: 39 %
Platelets: 292 10*3/uL (ref 140–400)
RBC: 4.36 MIL/uL (ref 3.80–5.10)
RDW: 13.2 % (ref 11.0–15.0)
WBC: 4.4 10*3/uL (ref 3.8–10.8)

## 2016-01-30 LAB — COMPLETE METABOLIC PANEL WITH GFR
ALT: 11 U/L (ref 6–29)
AST: 15 U/L (ref 10–35)
Albumin: 4.4 g/dL (ref 3.6–5.1)
Alkaline Phosphatase: 50 U/L (ref 33–130)
BUN: 12 mg/dL (ref 7–25)
CO2: 27 mmol/L (ref 20–31)
Calcium: 9.9 mg/dL (ref 8.6–10.4)
Chloride: 103 mmol/L (ref 98–110)
Creat: 0.86 mg/dL (ref 0.50–1.05)
GFR, Est African American: >89 mL/min (ref >=60)
GFR, Est Non African American: 78 mL/min (ref >=60)
Glucose, Bld: 67 mg/dL (ref 65–99)
Potassium: 4 mmol/L (ref 3.5–5.3)
Sodium: 141 mmol/L (ref 135–146)
Total Bilirubin: 0.3 mg/dL (ref 0.2–1.2)
Total Protein: 6.9 g/dL (ref 6.1–8.1)

## 2016-01-30 LAB — LIPID PANEL
Cholesterol: 200 mg/dL (ref 125–200)
HDL: 75 mg/dL (ref >=46)
LDL Cholesterol: 112 mg/dL (ref <130)
Total CHOL/HDL Ratio: 2.7 Ratio (ref <=5.0)
Triglycerides: 64 mg/dL (ref <150)
VLDL: 13 mg/dL (ref <30)

## 2016-01-30 LAB — TSH: TSH: 2.97 mIU/L

## 2016-01-30 MED ORDER — DIAZEPAM 10 MG PO TABS: 10.0000 mg | ORAL_TABLET | Freq: Two times a day (BID) | ORAL | Status: DC | PRN

## 2016-01-30 MED ORDER — AMITRIPTYLINE HCL 50 MG PO TABS: 50.0000 mg | ORAL_TABLET | Freq: Every day | ORAL | Status: DC

## 2016-01-30 MED ORDER — DIAZEPAM 10 MG PO TABS: 10.0000 mg | ORAL_TABLET | Freq: Every day | ORAL | Status: DC | PRN

## 2016-01-30 NOTE — Progress Notes (Signed)
HPI: Renee Norris is a 50 y.o. female who presents to [facility] today for chief complaint of:  Chief Complaint  Patient presents with  . Establish Care    "established patient, physical therapy, pain management for low back pain"     DJD/BACK PAIN - Has been getting medications through Ortho - but she stopped the Valium Rx for muscle pain (usually takes this at night). 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  Upcoming facet injection next week. Has had 3 previous injections in different spots (hip, 2 in back) which didn't help as much. She requests referral to PT. She has been in a [employer] study before for CBT to treat pain, she thought this helped a lot and would like a referral to ta therapist.   DEPRESSION - Has been on Wellbutrin 10 years ago, can't remember other medications. Feels like it's situational, has a lot to do with being unable to work and her chronic pain.   HEADACHE ISSUES - seem to happen more often since she has been on HRT, no headache at this time.   Previously seen [place]ative therapy [place].   Seen by OBGYN for hormone replacement therapies.     Past medical, social and family history reviewed: Past Medical History  Diagnosis Date  . Chronic back pain 01/30/2016    folowing with Ortho for Tramadol, we are managing benzo therapy, started TCA as well, refer to PT, refer to [facility] for [facility]BT   . Depression 01/30/2016    TCA to help this as well as pain/sleep issues, refer for CBT   . Insomnia 01/30/2016    TCA, do not take with benzos   . Postmenopausal 01/30/2016    HRT from OBGYN    Past Surgical History  Procedure Laterality Date  . Breast surgery    . Tonsillectomy    . Gyn surgery    . Abdominal surgery     Social History  Substance Use Topics  . Smoking status: Former Games developermoker  . Smokeless tobacco: Not on file  . Alcohol Use: No     Comment: 2-3 glasses of wine a day   Family History  Problem  Relation Age of Onset  . Diabetes Mother   . Hypertension Mother   . Cancer Mother   . Diabetes Father   . Hypertension Father   . Cancer Father     Current Outpatient Prescriptions  Medication Sig Dispense Refill  .    1  . aspirin 81 MG tablet Take 81 mg by mouth daily.    Marland Kitchen. BLACK PEPPER-TURMERIC PO Take by mouth.    . Cholecalciferol (VITAMIN D-3 PO) Take by mouth.    . diazepam (VALIUM) 10 MG tablet Take 1 tablet (10 mg total) by mouth daily as needed (muscle spasm, 2 -3 times per week). 2 months supply 30 tablet 0  . estradiol (ESTRACE) 0.5 MG tablet Take 0.5 mg by mouth daily.  0  . Menaquinone-7 (VITAMIN K2 PO) Take by mouth.    . Omega-3 Fatty Acids (OMEGA 3 PO) Take by mouth.    . Pregnenolone POWD by Does not apply route.    . progesterone (PROMETRIUM) 100 MG capsule Take 200 mg by mouth daily.  0  . traMADol (ULTRAM) 50 MG tablet Take 50 mg by mouth 2 (two) times daily as needed.  0   No current facility-administered medications for this visit.   No Known Allergies    Review  of Systems: CONSTITUTIONAL:  No  fever, no chills, No recent illness, No unintentional weight changes HEAD/EYES/EARS/NOSE/THROAT: (+) headache, no vision change, no hearing change, No sore throat, No  sinus pressure CARDIAC: No  chest pain, No  pressure, No palpitations, No  orthopnea RESPIRATORY: No  cough, No  shortness of breath/wheeze GASTROINTESTINAL: No  nausea, No  vomiting, No  abdominal pain, No  blood in stool, No  diarrhea, No  constipation  MUSCULOSKELETAL: (+) myalgia/arthralgia GENITOURINARY: No  incontinence, No  abnormal genital bleeding/discharge SKIN: No  rash/wounds/concerning lesions HEM/ONC: No  easy bruising/bleeding, No  abnormal lymph node ENDOCRINE: No polyuria/polydipsia/polyphagia, No  heat/cold intolerance  NEUROLOGIC: No  weakness, No  dizziness, No  slurred speech PSYCHIATRIC: (+) concerns with depression, No  concerns with anxiety, No sleep problems  Exam:  BP  107/71 mmHg  Pulse 55  Ht  (1.626 m)  Wt 160 lb (72.576 kg)  BMI 27.45 kg/m2  LMP 04/21/2014 (Approximate) Constitutional: VS see above. General Appearance: alert, well-developed, well-nourished, NAD Eyes: Normal lids and conjunctive, non-icteric sclera, PERRLA Ears, Nose, Mouth, Throat: MMM, Normal external inspection ears/nares/mouth/lips/gums, TM normal bilaterally. Pharynx/tonsils no erythema, no exudate. Nasal mucosa normal.  Neck: No masses, trachea midline. No thyroid enlargement. No tenderness/mass appreciated. No lymphadenopathy Respiratory: Normal respiratory effort. no wheeze, no rhonchi, no rales Cardiovascular: S1/S2 normal, no murmur, no rub/gallop auscultated. RRR. No lower extremity edema. Gastrointestinal: Nontender, no masses. No hepatomegaly, no splenomegaly. No hernia appreciated. Bowel sounds normal. Rectal exam deferred.  Musculoskeletal: Gait normal. No clubbing/cyanosis of digits. (+) paraspinal tenderness lumbar spine on R Neurological: No cranial nerve deficit on limited exam. Motor and sensation intact and symmetric Skin: warm, dry, intact. No rash/ulcer. No concerning nevi or subq nodules on limited exam.   Psychiatric: Normal judgment/insight. Normal mood and affect. Oriented x3.     MRI 01/15/16 Reviewed  IMPRESSION: No right-sided disc herniation or right-sided nerve root compression detected as cause of patient's symptoms. Since the prior examination, significant progressive facet degenerative changes L3-4 level. L3-4 ligamentum flavum hypertrophy with minimal anterior slip L3 and mild bulge contribute to very mild spinal stenosis and very mild bilateral foraminal narrowing. L5-S1 small left paracentral protrusion with mild crowding of the upper left S1 nerve root. Significance indeterminate as patient has symptoms on the right. Left lateral extension of L5-S1 disc crowds but does not compress the exiting left L5 nerve root. L4-5 facet degenerative  changes and bulge with small left foraminal annular fissure and mild spinal stenosis/mild bilateral foraminal narrowing similar to the prior exam. Heterogeneous bone marrow stable. Correlation with CBC to determine if anemia may contribute to this appearance may be considered.    ASSESSMENT/PLAN:  Chronic back pain - folowing with Ortho for Tramadol, we are managing benzo therapy, started TCA as well, refer to PT, refer to [facility] for CBT - Plan: Ambulatory referral to Physical Therapy, Ambulatory referral to Psychology, diazepam (VALIUM) 10 MG tablet, DISCONTINUED: diazepam (VALIUM) 10 MG tablet  Depression - TCA to help this as well as pain/sleep issues, refer for CBT - Plan: amitriptyline (ELAVIL) 50 MG tablet, Ambulatory referral to Psychology  Insomnia - TCA, do not take with benzos - Plan: amitriptyline (ELAVIL) 50 MG tablet  Nonintractable headache, unspecified chronicity pattern, unspecified headache type - Possible exacerbation of estrogen, wlil monitor  Medication management - Plan: CBC with Differential/Platelet, COMPLETE METABOLIC PANEL WITH GFR  Other fatigue - Plan: TSH  Postmenopausal - HRT from OBGYN - Plan: VITAMIN D 25 Hydroxy (  Vit-D Deficiency, Fractures)  Screening, lipid - Plan: Lipid panel     All questions were answered. Visit summary with medication list and pertinent instructions was printed for patient to review. ER/RTC precautions were reviewed with the patient. Return in about 6 weeks (around 03/12/2016), or sooner if needed, for ANNUAL WELLNESS/PHYSICAL, MEDICATION MANAGEMENT .

## 2016-01-30 NOTE — Patient Instructions (Signed)
Let's plan to follow-up hrre in the office in 1 - 2 months to check on your progress with the new medicine. At that visit, we will also review preventive care measures and make sue you are up-to-date on all recommended screening and wellness tests.   As long as you're doing well, let's plan to follow-up every 2-3 months for management/monitoring of chronic medical issues and prescription of controlled substance medications. Please don't hesitate to make an appointment sooner if you're having any acute concerns or problems!  Please let your pharmacy know when you are running low on medications/refills (do not wait until you are out of medicines). Your pharmacy will send our office a request for the appropriate medications. Please allow our office 2-3 business days to process the needed refills.   At any visits to any of your specialists who are not in the [facility] system, or if you receive vaccines anywhere other than this office, please give them our clinic information so that they can forward us any records, including any tests which are done or changes to your medications. This allows all your physicians to communicate effectively, putting your primary care doctor at the center of your medical care and allowing us to effectively coordinate your care.   Please let us know if there is anything else we can do for you. Take care! -Dr. Mervyn SkeetersA.

## 2016-01-31 LAB — VITAMIN D 25 HYDROXY (VIT D DEFICIENCY, FRACTURES): Vit D, 25-Hydroxy: 53 ng/mL (ref 30–100)

## 2016-02-06 ENCOUNTER — Ambulatory Visit (INDEPENDENT_AMBULATORY_CARE_PROVIDER_SITE_OTHER): Payer: BLUE CROSS/BLUE SHIELD | Admitting: Rehabilitative and Restorative Service Providers"

## 2016-02-06 ENCOUNTER — Encounter: Payer: Self-pay | Admitting: Rehabilitative and Restorative Service Providers"

## 2016-02-06 DIAGNOSIS — R29898 Other symptoms and signs involving the musculoskeletal system: Secondary | ICD-10-CM

## 2016-02-06 DIAGNOSIS — M791 Myalgia, unspecified site: Secondary | ICD-10-CM

## 2016-02-06 DIAGNOSIS — M545 Low back pain, unspecified: Secondary | ICD-10-CM

## 2016-02-06 NOTE — Therapy (Signed)
[facility] [place] [address] [place], [place], [address] Phone: [phone]   Fax:  [phone]  Physical Therapy Evaluation  Patient Details  Name: Renee Norris MRN: [id number] Date of Birth: 1966-05-10 Referring Provider: Dr. Sunnie Nielsen   Encounter Date: 02/06/2016      PT End of Session - 02/06/16 1010    Visit Number 1   Number of Visits 12   Date for PT Re-Evaluation 03/19/16   PT Start Time 1015   PT Stop Time 1109   PT Time Calculation (min) 54 min   Activity Tolerance Patient tolerated treatment well      Past Medical History  Diagnosis Date  . Chronic back pain 01/30/2016    folowing with Ortho for Tramadol, we are managing benzo therapy, started TCA as well, refer to PT, refer to [facility] for CBT   . Depression 01/30/2016    TCA to help this as well as pain/sleep issues, refer for CBT   . Insomnia 01/30/2016    TCA, do not take with benzos   . Postmenopausal 01/30/2016    HRT from OBGYN     Past Surgical History  Procedure Laterality Date  . Breast surgery    . Tonsillectomy    . Gyn surgery    . Abdominal surgery      There were no vitals filed for this visit.       Subjective Assessment - 02/06/16 1022    Subjective Patient reports that she has been experiencing LBP at L4/5 over the past 4 years with no known injury. She played competetive tennis since 54 yr of age but has not played tennis since 11/16. She has had pain shooting into the Rt hip and buttock for ~ 2 years. 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 She has treated with medication and three rounds of Physical Therapy with some improvement. ESI x 2 with no improvement. Crotisone injection Rt hip 4/17 with ~ 2 days of improvement. She has received chiropractic care and TDN without improvement. She is scheduled for another injectioin today - in facet joint.    Pertinent History Denies any other musculoskeletal problems. menopausal for ~ 1-2  years; low vitamin D   How long can you sit comfortably? 5 min    How long can you stand comfortably? 1 hour - shifting weight side to side rocking    How long can you walk comfortably? 20 min    Diagnostic tests xrays; MRI   Patient Stated Goals try to get stronger back muscles and core; to be consistent with the exercises    Currently in Pain? Yes   Pain Score 5    Pain Location Back   Pain Orientation Right   Pain Descriptors / Indicators Cramping;Tightness   Pain Type Chronic pain   Pain Radiating Towards into the Rt buttock - cramp like    Pain Onset More than a month ago   Pain Frequency Constant   Aggravating Factors  sitting; twisting; bending backwards; lifting; household chores or yard work for > 1 hour   Pain Relieving Factors meds; lying on Lt side; ice (has a TENS unit which does not help); biofreeze with heating pad             [facility] PT Assessment - 02/06/16 0001    Assessment   Medical Diagnosis Chronic LBP   Referring Provider Dr. Sunnie Nielsen    Onset Date/Surgical Date 01/19/12   Hand Dominance  Right   Next MD Visit 08/17   Prior Therapy yes - 3 courses of PT; chiropractic care; TDN; injections    Balance Screen   Has the patient fallen in the past 6 months No   Has the patient had a decrease in activity level because of a fear of falling?  No   Is the patient reluctant to leave their home because of a fear of falling?  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); gardens   Observation/Other Assessments   Focus on Therapeutic Outcomes (FOTO)  58% limitation    Sensation   Additional Comments WFL's per pt report    Posture/Postural  Control   Posture Comments stands with wt shifted to Lt; slight rotation Lt hip posteriorly; Lt LE shorter than Rt at crest of pelvis and greater trochanter   AROM   Overall AROM Comments tight end range Rt hip motions    Lumbar Flexion 100%    Lumbar Extension 30%   pain bilat lower lumbar and sacral border    Lumbar - Right Side Bend finger tips 2 in above lat knee jt line with discomfort on the Rt    Lumbar - Left Side Bend finger tips ~2 inches above lat jt line of knee with pain in Rt LB    Lumbar - Right Rotation 45-50% no pain   Lumbar - Left Rotation 45-50% no pain    Strength   Overall Strength Comments 5/5 Lt LE; 5/5 Rt hip add; Rt hip 5-/5 abd and ext; 4/5 flexion Rt knee flex/ext 5-/5    Flexibility   Hamstrings tight Rt ~90 deg pulling pain in the postreior proximal thigh; Lt tight ~ 100 deg    Quadriceps WFL's   ITB WFL's   Piriformis tight Rt compared to Lt    Palpation   Spinal mobility painful with CPA mobs L5/4/3; Rt/Lt UPA mobs    SI assessment  painful Rt SI joint    Palpation comment musculr tightness Rt lumbar parsapinals; glut med; piriformis    Special Tests    Special Tests --  SLR (-) Cherrie Distance (-)                    [facility] Adult PT Treatment/Exercise - 02/06/16 0001    Lumbar Exercises: Stretches   Passive Hamstring Stretch 3 reps;30 seconds  Rt   Press Ups 5 reps  2-3 sec hold    Piriformis Stretch 30 seconds;2 reps  Rt   Piriformis Stretch Limitations diagonal knee to chest Rt 30 sec x 2    Lumbar Exercises: Supine   Ab Set --  3 part core 10 sec x 10    Lumbar Exercises: Prone   Other Prone Lumbar Exercises pelvic press 5 sec x 10    Cryotherapy   Number Minutes Cryotherapy 15 Minutes   Cryotherapy Location Lumbar Spine   Type of Cryotherapy Ice pack                PT Education - 02/06/16 1102    Education provided Yes   Education Details HEP   Person(s) Educated  Patient   Methods Explanation;Demonstration;Tactile  cues;Verbal cues;Handout   Comprehension Verbalized understanding;Returned demonstration;Verbal cues required;Tactile cues required             PT Long Term Goals - 02/06/16 1342    PT LONG TERM GOAL #1   Title Improve posture and alignment in standing with patient to demonstrate more symmetrical standing posture 03/19/16   Time 6   Period Weeks   Status New   PT LONG TERM GOAL #2   Title Imrpove tissue extensibility and joint moblity Rt hip to equal Lt 03/19/16   Time 6   Period Weeks   Status New   PT LONG TERM GOAL #3   Title Improve core strength progressing with functional strengthening program and increasing sitting tolerance to 15 min 03/19/16   Time 6   Period Weeks   Status New   PT LONG TERM GOAL #4   Title Independnet in HEP 03/19/16   Time 6   Period Weeks   Status New   PT LONG TERM GOAL #5   Title Improve FOTO to </= 40% limitation 03/19/16   Time 6   Period Weeks   Status New               Plan - 02/06/16 1315    Clinical Impression Statement Patient presents with historyu of LBP for the past 4 years with no known injury. Pain in primarily in the low back and into the Rt hip and buttock. She has increased pain with sitting and walking and feels better with standing. Renee Norris has incresaed pain following functional and recreational activities. She has an apparent leg length difference wit hthe Lt LE shorter than Rt; pain with palpation through the lumbar spine and Rt SI areas; muscular tigtness through the Rt hip including the glut medius and piriformis; limited trunk mobilty; weakness Rt LE; pain on a daily basis.       Rehab Potential Good   PT Frequency 2x / week   PT Duration 6 weeks   PT Treatment/Interventions Patient/family education;ADLs/Self Care Home Management;Cryotherapy;Electrical Stimulation;Iontophoresis /ml Dexamethasone;Moist Heat;Traction;Ultrasound;Neuromuscular re-education;Manual techniques;Dry needling;Therapeutic activities;Therapeutic  exercise   PT Next Visit Plan core stabilization; progress prone press series; manual v TDN to Rt lumbar and hip musculature - (Marieke reposrts no change with TDN in past but also had no pain or soreness from the treatment- another trial may be warranted)  She has not responded well to TENS unit/E-stim  - may benefit from trial of [place] to Rt lumbar paraspinals and hip musculature. Will try a 1/8 to 1/4 in lift Lt shoe to correct leg length difference. continue further assessment of SI dysfunctioin and chronic pain as indicated   Consulted and Agree with Plan of Care Patient      Patient will benefit from skilled therapeutic intervention in order to improve the following deficits and impairments:  Postural dysfunction, Improper body mechanics, Pain, Decreased range of motion, Decreased mobility, Decreased strength, Increased fascial restricitons, Increased muscle spasms, Decreased activity tolerance  Visit Diagnosis: Bilateral low back pain without sciatica - Plan: PT plan of care cert/re-cert  Myalgia - Plan: PT plan of care cert/re-cert  Other symptoms and signs involving the musculoskeletal system - Plan: PT plan of care cert/re-cert     Problem List Patient Active Problem List   Diagnosis Date Noted  . Chronic back pain 01/30/2016  . Depression 01/30/2016  . Insomnia 01/30/2016  . Cephalalgia 01/30/2016  . Medication management 01/30/2016  . Other fatigue 01/30/2016  .  Postmenopausal 01/30/2016  . WOUND, OPEN, LEG, WITHOUT COMPLICATION 03/20/2011    [place]co Gastelum Rober MinionP Simone Tuckey PT, MPH  02/06/2016, 1:47 PM  [facility] [address] [place], [place], [address] Phone: [phone]   Fax:  [phone]  Name: Renee Norris MRN: [id number] Date of Birth: [birth year]

## 2016-02-06 NOTE — Patient Instructions (Signed)
Abdominal Bracing With Pelvic Floor (Hook-Lying)    With neutral spine, tighten pelvic floor and abdominals sucking belly button to back bone; tighten muscles in the low back at waist. Hold 10 sec  Repeat _10__ times. Do _several__ times a day. Progress to do this in sitting; standing; walking and with functional activites   Piriformis Stretch    Lying on back, pull right knee toward opposite shoulder. Hold __30__ seconds. Repeat __3__ times. Do __2-3__ sessions per day.   HIP: Hamstrings - Supine    Place strap around foot. Raise leg up, keep knee straight. Hold __30_ seconds. __3_ reps per set, __2-3 sets per day   Pelvic Press    Place hands under belly between navel and pubic bone, palms up. Feel pressure on hands. Increase pressure on hands by pressing pelvis down. This is NOT a pelvic tilt. Hold _5__ seconds. Relax. Repeat _10__ times.

## 2016-02-10 ENCOUNTER — Ambulatory Visit (INDEPENDENT_AMBULATORY_CARE_PROVIDER_SITE_OTHER): Payer: BLUE CROSS/BLUE SHIELD | Admitting: Physical Therapy

## 2016-02-10 DIAGNOSIS — M791 Myalgia, unspecified site: Secondary | ICD-10-CM

## 2016-02-10 DIAGNOSIS — R29898 Other symptoms and signs involving the musculoskeletal system: Secondary | ICD-10-CM | POA: Diagnosis not present

## 2016-02-10 DIAGNOSIS — M545 Low back pain, unspecified: Secondary | ICD-10-CM

## 2016-02-10 LAB — HM MAMMOGRAPHY

## 2016-02-10 NOTE — Therapy (Signed)
[facility] [address] [place], [place], [address] Phone: [phone]   Fax:  [phone]  Physical Therapy Treatment  Patient Details  Name: Renee Norris MRN: [id number] Date of Birth: 1966-05-04 Referring Provider: Dr. Sunnie NielsenNatalie Alexander   Encounter Date: 02/10/2016      PT End of Session - 02/10/16 0937    Visit Number 2   Number of Visits 12   Date for PT Re-Evaluation 03/19/16   PT Start Time 0934   PT Stop Time 1032   PT Time Calculation (min) 58 min      Past Medical History  Diagnosis Date  . Chronic back pain 01/30/2016    folowing with Ortho for Tramadol, we are managing benzo therapy, started TCA as well, refer to PT, [facility]   . Depression 01/30/2016    TCA to help this as well as pain/sleep issues, refer for CBT   . Insomnia 01/30/2016    TCA, do not take with benzos   . Postmenopausal 01/30/2016    HRT from OBGYN     Past Surgical History  Procedure Laterality Date  . Breast surgery    . Tonsillectomy    . Gyn surgery    . Abdominal surgery      There were no vitals filed for this visit.      Subjective Assessment - 02/10/16 0937    Subjective Pt reports she is still sore from injection in back of Rt hip. Not sleeping well.  She is performing HEP 1-2 x/ day.     Currently in Pain? Yes   Pain Score 4    Pain Location Back   Pain Orientation Right   Pain Descriptors / Indicators Tightness;Sore   Pain Radiating Towards into Rt buttock            [facility] - 02/10/16 0001    Assessment   Medical Diagnosis Chronic LBP   Referring Provider Dr. Sunnie NielsenNatalie Alexander    Onset Date/Surgical Date 01/19/12   Hand Dominance Right   Next MD Visit 08/17   Prior Therapy yes - 3 courses of PT; chiropractic care; TDN; injections           [facility] Adult PT Treatment/Exercise - 02/10/16 0001    Self-Care   Self-Care Other Self-Care Comments   Other Self-Care  Comments  Pt educated on supine to/from sit via log roll and pt encouraged to engage core during transitions.  Pt verbalized understanding and returned demo 2 x.     Lumbar Exercises: Stretches   Passive Hamstring Stretch 3 reps;30 seconds  each leg   Prone on Elbows Stretch 3 reps;20 seconds   Press Ups 5 reps  2-3 sec hold    ITB Stretch 3 reps;30 seconds   Piriformis Stretch 3 reps;30 seconds   Lumbar Exercises: Aerobic   Stationary Bike NuStep L4: 6 min    Lumbar Exercises: Supine   Ab Set 10 reps;5 seconds   Clam 5 reps  each leg, with abdominal bracing    Bent Knee Raise 5 reps  each leg with abdominal bracing.    Lumbar Exercises: Prone   Other Prone Lumbar Exercises pelvic press 5 sec x 10; press with unilateral knee flexion x 5 reps each leg.     Cryotherapy   Number Minutes Cryotherapy 15 Minutes   Cryotherapy Location Lumbar Spine   Type of Cryotherapy Ice pack   Manual Therapy   Manual Therapy  Taping   Kinesiotex Edema   Kinesiotix   Edema Rock tape placed over Rt SI joint, to decrease localized swelling and bruising (where injection was).                 PT Education - 02/10/16 1348    Education provided Yes   Education Details Pt issued heel lift (for Lt shoe, leg length discrepancy); pt educated on use and precautions of Rock Tape.    Person(s) Educated Patient   Methods Explanation   Comprehension Verbalized understanding             PT Long Term Goals - 02/06/16 1342    PT LONG TERM GOAL #1   Title Improve posture and alignment in standing with patient to demonstrate more symmetrical standing posture 03/19/16   Time 6   Period Weeks   Status New   PT LONG TERM GOAL #2   Title Imrpove tissue extensibility and joint moblity Rt hip to equal Lt 03/19/16   Time 6   Period Weeks   Status New   PT LONG TERM GOAL #3   Title Improve core strength progressing with functional strengthening program and increasing sitting tolerance to 15 min 03/19/16    Time 6   Period Weeks   Status New   PT LONG TERM GOAL #4   Title Independnet in HEP 03/19/16   Time 6   Period Weeks   Status New   PT LONG TERM GOAL #5   Title Improve FOTO to </= 40% limitation 03/19/16   Time 6   Period Weeks   Status New               Plan - 02/10/16 1004    Clinical Impression Statement Pt reported decrease in pain in low back with therapeutic exercise and further reduction with use of ice at end of session.     Rehab Potential Good   PT Frequency 2x / week   PT Duration 6 weeks   PT Next Visit Plan Trial of US to Rt lumbar paraspinals/ hip.  Assess response to heel lift. Progress HEP as tolerated with core stabilization.     Consulted and Agree with Plan of Care Patient      Patient will benefit from skilled therapeutic intervention in order to improve the following deficits and impairments:  Postural dysfunction, Improper body mechanics, Pain, Decreased range of motion, Decreased mobility, Decreased strength, Increased fascial restricitons, Increased muscle spasms, Decreased activity tolerance  Visit Diagnosis: Bilateral low back pain without sciatica  Myalgia  Other symptoms and signs involving the musculoskeletal system     Problem List Patient Active Problem List   Diagnosis Date Noted  . Chronic back pain 01/30/2016  . Depression 01/30/2016  . Insomnia 01/30/2016  . Cephalalgia 01/30/2016  . Medication management 01/30/2016  . Other fatigue 01/30/2016  . Postmenopausal 01/30/2016  . WOUND, OPEN, LEG, WITHOUT COMPLICATION 03/20/2011   Mayer CamelJennifer Carlson-Long, PTA 02/10/2016 1:57 PM  [facility] [address] [place], [place], [address] Phone: [phone]   Fax:  [phone]  Name: Renee Norris MRN: [id number] Date of Birth: [birth year]

## 2016-02-13 ENCOUNTER — Ambulatory Visit (INDEPENDENT_AMBULATORY_CARE_PROVIDER_SITE_OTHER): Payer: BLUE CROSS/BLUE SHIELD | Admitting: Physical Therapy

## 2016-02-13 DIAGNOSIS — M791 Myalgia, unspecified site: Secondary | ICD-10-CM

## 2016-02-13 DIAGNOSIS — M545 Low back pain, unspecified: Secondary | ICD-10-CM

## 2016-02-13 DIAGNOSIS — R29898 Other symptoms and signs involving the musculoskeletal system: Secondary | ICD-10-CM

## 2016-02-13 NOTE — Patient Instructions (Signed)
  Abdominal Bracing With Pelvic Floor (Hook-Lying)   With neutral spine, tighten pelvic floor and abdominals. Hold 10 seconds. Repeat __10_ times. Do _1__ times a day.   Knee to Chest: Transverse Plane Stability   Bring one knee up, then return. Be sure pelvis does not roll side to side. Keep pelvis still. Lift knee __10_ times each leg. Restabilize pelvis. Repeat with other leg. Do _1-2__ sets, _1__ times per day.   Hip External Rotation With Pillow: Transverse Plane Stability   One knee bent, one leg straight, on pillow. Slowly roll bent knee out. Be sure pelvis does not rotate. Do _10__ times. Restabilize pelvis. Repeat with other leg. Do _1-2__ sets, _1__ times per day.  Heel Slide: 4-10 Inches - Transverse Plane Stability   Slide heel 4 inches down. Be sure pelvis does not rotate. Do _10__ times. Restabilize pelvis. Repeat with other leg. Do __1_ sets, _1__ times per day.   [facility] [address] [place], [place] [address]  [phone] (office) [phone] (fax)   

## 2016-02-13 NOTE — Therapy (Addendum)
[facility] [place], [place], [address] Phone: [phone]   Fax:  [phone]  Physical Therapy Treatment  Patient Details  Name: Renee Norris MRN: [id number] Date of Birth: 1965-11-22 Referring Provider: Dr. Emeterio Reeve  Encounter Date: 02/13/2016      PT End of Session - 02/13/16 0942    Visit Number 3   Number of Visits 12   Date for PT Re-Evaluation 03/19/16   PT Start Time 0930   PT Stop Time 1018   PT Time Calculation (min) 48 min   Activity Tolerance Patient tolerated treatment well;No increased pain      Past Medical History  Diagnosis Date  . Chronic back pain 01/30/2016    folowing with Ortho for Tramadol, we are managing benzo therapy, started TCA as well, refer to PT, refer to [facility] for [facility]BT   . Depression 01/30/2016    TCA to help this as well as pain/sleep issues, refer for CBT   . Insomnia 01/30/2016    TCA, do not take with benzos   . Postmenopausal 01/30/2016    HRT from OBGYN     Past Surgical History  Procedure Laterality Date  . Breast surgery    . Tonsillectomy    . Gyn surgery    . Abdominal surgery      There were no vitals filed for this visit.      Subjective Assessment - 02/13/16 0934    Subjective Pt reports she rested this weekend.   She began having pain when performing 3 part core when tensing back muscles (up to 5/10); took Tramadol and this has helped bring pain down a little.  She states she placed the heel insert in Lt shoe and she feels more balanced.     Currently in Pain? Yes   Pain Score 1    Pain Location Back   Pain Orientation Right   Pain Descriptors / Indicators Dull   Aggravating Factors  sitting, twisting, bending backwards   Pain Relieving Factors meds, lying on Lt side, heat/ice             [facility] PT Assessment - 02/13/16 0001    Assessment   Medical Diagnosis Chronic LBP   Referring Provider Dr. Emeterio Reeve   Onset  Date/Surgical Date 01/19/12   Hand Dominance Right   Next MD Visit 08/17   Prior Therapy yes - 3 courses of PT; chiropractic care; TDN; injections           [facility] Adult PT Treatment/Exercise - 02/13/16 0001    Lumbar Exercises: Stretches   Passive Hamstring Stretch 3 reps;30 seconds  each leg   Double Knee to Chest Stretch 30 seconds;3 reps   ITB Stretch 3 reps;30 seconds   Lumbar Exercises: Aerobic   Stationary Bike L2: 5 min   Lumbar Exercises: Supine   Ab Set 5 reps;5 seconds   Clam 10 reps  each leg with abdominal bracing    Heel Slides 10 reps  each leg with abdominal bracing   Bent Knee Raise 10 reps  each leg with abdominal bracing    Bridge 10 reps  segmental rolling   Lumbar Exercises: Prone   Other Prone Lumbar Exercises childs pose stretch, with lateral trunk flexion Rt/Lt x 2 reps each direction    Modalities   Modalities --  pt declined modalities, will do at home.    Manual Therapy   Manual Therapy Taping;Soft tissue mobilization;Myofascial release  Soft tissue mobilization Deep pressure and TPR to Rt hip rotators, glute med.    Myofascial Release to Rt QL.    Kinesiotex Edema   Kinesiotix   Edema Rock tape placed over Rt SI joint, to decrease localized swelling and bruising (where injection was), decompress area for pain relief.                 PT Education - 02/13/16 0953    Education provided Yes   Education Details HEP - trans abdominal series   Person(s) Educated Patient   Methods Handout;Explanation;Demonstration   Comprehension Verbalized understanding;Returned demonstration             PT Long Term Goals - 02/13/16 0945    PT LONG TERM GOAL #1   Title Improve posture and alignment in standing with patient to demonstrate more symmetrical standing posture 03/19/16   Time 6   Period Weeks   Status On-going   PT LONG TERM GOAL #2   Title Improve tissue extensibility and joint moblity Rt hip to equal Lt 03/19/16   Time 6   Period  Weeks   Status On-going   PT LONG TERM GOAL #3   Title Improve core strength progressing with functional strengthening program and increasing sitting tolerance to 15 min 03/19/16   Time 6   Period Weeks   Status On-going   PT LONG TERM GOAL #4   Title Independent in HEP 03/19/16   PT LONG TERM GOAL #5   Title Improve FOTO to </= 40% limitation 03/19/16   Time 6   Period Weeks   Status On-going               Plan - 02/13/16 1104    Clinical Impression Statement Pt tolerated all exercises well, without increase in pain.  Pt reported elimination of pain by end of session.  Pt progressing well towards goals.    Rehab Potential Good   PT Frequency 2x / week   PT Duration 6 weeks   PT Treatment/Interventions Patient/family education;ADLs/Self Care Home Management;Cryotherapy;Electrical Stimulation;Iontophoresis '4mg'$ /ml Dexamethasone;Moist Heat;Traction;Ultrasound;Neuromuscular re-education;Manual techniques;Dry needling;Therapeutic activities;Therapeutic exercise   PT Next Visit Plan Trial of [place] to Rt lumbar paraspinals/ hip. Progress HEP as tolerated with core stabilization.     Consulted and Agree with Plan of Care Patient      Patient will benefit from skilled therapeutic intervention in order to improve the following deficits and impairments:  Postural dysfunction, Improper body mechanics, Pain, Decreased range of motion, Decreased mobility, Decreased strength, Increased fascial restricitons, Increased muscle spasms, Decreased activity tolerance  Visit Diagnosis: Bilateral low back pain without sciatica  Myalgia  Other symptoms and signs involving the musculoskeletal system     Problem List Patient Active Problem List   Diagnosis Date Noted  . Chronic back pain 01/30/2016  . Depression 01/30/2016  . Insomnia 01/30/2016  . Cephalalgia 01/30/2016  . Medication management 01/30/2016  . Other fatigue 01/30/2016  . Postmenopausal 01/30/2016  . WOUND, OPEN, LEG, WITHOUT  COMPLICATION 16/05/9603   Renee Norris, PTA 02/13/2016 11:06 AM  [place], [place], [address] Phone: [phone]   Fax:  [phone]  Name: Renee Norris MRN: [id number] Date of Birth: [birth year]    PHYSICAL THERAPY DISCHARGE SUMMARY  Visits from Start of Care: 3  Current functional level related to goals / functional outcomes: Continued pain and dysfunction; minimal change with treatment   Remaining deficits: Minimal changes reported with therapy.  Education / Equipment: HEP  Plan: Patient agrees to discharge.  Patient goals were not met. Patient is being discharged due to financial reasons.  ?????     Celyn P. Helene Kelp PT, MPH 02/22/2016 5:03 PM

## 2016-02-14 ENCOUNTER — Encounter: Payer: Self-pay | Admitting: Osteopathic Medicine

## 2016-02-14 DIAGNOSIS — Z8679 Personal history of other diseases of the circulatory system: Secondary | ICD-10-CM

## 2016-02-14 DIAGNOSIS — Z87898 Personal history of other specified conditions: Secondary | ICD-10-CM | POA: Insufficient documentation

## 2016-02-14 DIAGNOSIS — Z9289 Personal history of other medical treatment: Secondary | ICD-10-CM | POA: Insufficient documentation

## 2016-02-17 ENCOUNTER — Encounter: Payer: BLUE CROSS/BLUE SHIELD | Admitting: Rehabilitative and Restorative Service Providers"

## 2016-03-09 ENCOUNTER — Ambulatory Visit (HOSPITAL_COMMUNITY): Payer: BLUE CROSS/BLUE SHIELD | Admitting: Licensed Clinical Social Worker

## 2016-03-12 ENCOUNTER — Encounter: Payer: BLUE CROSS/BLUE SHIELD | Admitting: Osteopathic Medicine

## 2016-03-14 ENCOUNTER — Ambulatory Visit (INDEPENDENT_AMBULATORY_CARE_PROVIDER_SITE_OTHER): Payer: BLUE CROSS/BLUE SHIELD | Admitting: Osteopathic Medicine

## 2016-03-14 ENCOUNTER — Encounter: Payer: Self-pay | Admitting: Osteopathic Medicine

## 2016-03-14 VITALS — BP 111/72 | HR 88 | Ht 64.0 in | Wt 165.0 lb

## 2016-03-14 DIAGNOSIS — G8929 Other chronic pain: Secondary | ICD-10-CM

## 2016-03-14 DIAGNOSIS — M549 Dorsalgia, unspecified: Secondary | ICD-10-CM

## 2016-03-14 DIAGNOSIS — F32A Depression, unspecified: Secondary | ICD-10-CM

## 2016-03-14 DIAGNOSIS — Z0000 Encounter for general adult medical examination without abnormal findings: Secondary | ICD-10-CM | POA: Diagnosis not present

## 2016-03-14 DIAGNOSIS — F329 Major depressive disorder, single episode, unspecified: Secondary | ICD-10-CM

## 2016-03-14 DIAGNOSIS — G4700 Insomnia, unspecified: Secondary | ICD-10-CM

## 2016-03-14 MED ORDER — DULOXETINE HCL 20 MG PO CPEP: 20.0000 mg | ORAL_CAPSULE | Freq: Every day | ORAL | 1 refills | Status: DC

## 2016-03-14 MED ORDER — TRAMADOL HCL 50 MG PO TABS: 50.0000 mg | ORAL_TABLET | Freq: Two times a day (BID) | ORAL | 2 refills | Status: DC | PRN

## 2016-03-14 NOTE — Patient Instructions (Addendum)
Cognitive Behavioral Therapy - online - [email or web address]

## 2016-03-14 NOTE — Progress Notes (Signed)
HPI: Renee Norris is a 50 y.o. Not Hispanic or Latino female  who presents to [facility] today, 03/14/16,  for chief complaint of:  Chief Complaint  Patient presents with  . Annual Exam    See below for preventive care. Labs reviewed with the patient - normal CBC, CMP, lipids, TSH, vitamin D.  *Depression with comorbid chronic pain syndrome: Patient has history of significant orthopedic problem/chronic low back pain. She is chronic pain has certainly been exacerbating depression problems and she has had difficulty dealing with this. We tried to clean at last visit to help with sleep issues, musculoskeletal pain, as well as depression but she didn't really like this medication and hasn't been taking it. She is open to trying Cymbalta.  *Postmenopausal: Doing well on hormone replacement therapy, had previously been getting this from alternative practitioner, okay to continue here.   Past medical, surgical, social and family history reviewed: Past Medical History:  Diagnosis Date  . Chronic back pain 01/30/2016   folowing with Ortho for Tramadol, we are managing benzo therapy, started TCA as well, refer to PT, refer to [facility]BT   . Depression 01/30/2016   TCA to help this as well as pain/sleep issues, refer for CBT   . Insomnia 01/30/2016   TCA, do not take with benzos   . Postmenopausal 01/30/2016   HRT from OBGYN    Past Surgical History:  Procedure Laterality Date  . ABDOMINAL SURGERY    . BREAST SURGERY    . GYN surgery    . TONSILLECTOMY     Social History  Substance Use Topics  . Smoking status: Former Games developer  . Smokeless tobacco: Not on file  . Alcohol use No     Comment: 2-3 glasses of wine a day   Family History  Problem Relation Age of Onset  . Diabetes Mother   . Hypertension Mother   . Cancer Mother   . Diabetes Father   . Hypertension Father   . Cancer Father      Current medication list and allergy/intolerance  information reviewed:   Current Outpatient Prescriptions  Medication Sig Dispense Refill  . amitriptyline (ELAVIL) 50 MG tablet Take 1 tablet (50 mg total) by mouth at bedtime. Start at 1/2 tablet (25 mg total) by mouth at bedtime for first week 30 tablet 1  . aspirin 81 MG tablet Take 81 mg by mouth daily.    Marland Kitchen BLACK PEPPER-TURMERIC PO Take by mouth.    . Cholecalciferol (VITAMIN D-3 PO) Take by mouth.    . diazepam (VALIUM) 10 MG tablet Take 1 tablet (10 mg total) by mouth daily as needed (muscle spasm, 2 -3 times per week). 2 months supply 30 tablet 0  . estradiol (ESTRACE) 0.5 MG tablet Take 0.5 mg by mouth daily.  0  . Menaquinone-7 (VITAMIN K2 PO) Take by mouth.    . Omega-3 Fatty Acids (OMEGA 3 PO) Take by mouth.    . Pregnenolone POWD by Does not apply route.    . progesterone (PROMETRIUM) 100 MG capsule Take 200 mg by mouth daily.  0  . traMADol (ULTRAM) 50 MG tablet Take 50 mg by mouth 2 (two) times daily as needed.  0   No current facility-administered medications for this visit.    No Known Allergies    Review of Systems:  Constitutional:  No  fever, no chills, No recent illness, No unintentional weight changes. (+) significant fatigue.  HEENT: No  headache, no vision change  Cardiac: No  chest pain, No  pressure  Respiratory:  No  shortness of breath.  Gastrointestinal: No  abdominal pain, No  nausea, No  vomiting,  No  blood in stool, No  diarrhea, No  constipation   Musculoskeletal: No new myalgia/arthralgia  Skin: No  Rash  Neurologic: No  weakness, No  dizziness  Psychiatric: (+) concerns with depression, No  concerns with anxiety, (+) sleep problems, No mood problems  Exam:  BP 111/72   Pulse 88   Ht 5\' 4"  (1.626 m)   Wt 165 lb (74.8 kg)   LMP 04/21/2014 (Approximate)   BMI 28.32 kg/m   Constitutional: VS see above. General Appearance: alert, well-developed, well-nourished, NAD  Eyes: Normal lids and conjunctive, non-icteric sclera  Ears, Nose,  Mouth, Throat: MMM, Normal external inspection ears/nares/mouth/lips/gums. TM normal bilaterally. Pharynx/tonsils no erythema, no exudate. Nasal mucosa normal.   Neck: No masses, trachea midline. No thyroid enlargement. No tenderness/mass appreciated. No lymphadenopathy  Respiratory: Normal respiratory effort. no wheeze, no rhonchi, no rales  Cardiovascular: S1/S2 normal, no murmur, no rub/gallop auscultated. RRR. No lower extremity edema. Pedal pulse II/IV bilaterally DP and PT. No carotid bruit or JVD. No abdominal aortic bruit.  Gastrointestinal: Nontender, no masses. No hepatomegaly, no splenomegaly. No hernia appreciated. Bowel sounds normal. Rectal exam deferred.   Musculoskeletal: Gait normal. No clubbing/cyanosis of digits.   Neurological: No cranial nerve deficit on limited exam. Motor and sensation intact and symmetric. Cerebellar reflexes intact. Normal balance/coordination. No tremor.   Skin: warm, dry, intact. No rash/ulcer. No concerning nevi or subq nodules on limited exam.    Psychiatric: Normal judgment/insight. Depressed mood and affect. Oriented x3.      ASSESSMENT/PLAN:   Preventive care reviewed as below, patient is pretty well up-to-date on everything however we are missing some records things like colonoscopy and Pap results, she is following with OB/GYN for her well woman care  *Regarding management of depression/chronic pain and postmenopausal symptoms - discontinue TCA, initiate Cymbalta for hopeful benefit for depression/chronic pain issues. I'm okay to take over for the estrogen/progesterone replacement, she is not having any other menopausal symptoms at this time. Okay to continue the tramadol, she states that she is using it typically once per day at least occasionally twice per day.  Annual physical exam  Depression  Chronic back pain    FEMALE PREVENTIVE CARE  ANNUAL SCREENING/COUNSELING Tobacco - no quit 2000 Alcohol - social  drinker Diet/Exercise - HEALTHY HABITS DISCUSSED TO DECREASE CV RISK Depression - PQH2 Positive discussed as above Domestic violence concerns - no HTN SCREENING - SEE VITALS Vaccination status - SEE BELOW  SEXUAL HEALTH Sexually active in the past year - yes With - Yes with female. STI - The patient denies history of sexually transmitted disease. STI testing today? - no  INFECTIOUS DISEASE SCREENING HIV - all adults 15-65 - does not need GC/CT - sexually active - does not need HepC - DOB [birth year] - does not need TB - does not need  DISEASE SCREENING Lipid - does not need DM2 - does not need Osteoporosis - does not need  CANCER SCREENING Cervical - needs Breast - MAMMO - does not need - 02/10/16 Birads2 Benign Lung - does not need Colon - does not need - referral on records to GI 02/07/15 - had colonoscopy this year (+) polyps   ADULT VACCINATION Influenza - was not indicated -  Td - already has HPV -  was not indicated Zoster - was not indicated Pneumonia - was not indicated  OTHER Fall - exercise and Vit D age 10+ - does not need Consider ASA - age 69-59 - does not need    Visit summary with medication list and pertinent instructions was printed for patient to review. All questions at time of visit were answered - patient instructed to contact office with any additional concerns. ER/RTC precautions were reviewed with the patient. Follow-up plan: Return in about 6 months (around 09/14/2016), or sooner if needed, for MEDICATION MANAGEMENT - PAIN, DEPRESSION, MENOPAUSE.  *[phone]

## 2016-03-15 ENCOUNTER — Encounter: Payer: Self-pay | Admitting: Osteopathic Medicine

## 2016-03-21 DIAGNOSIS — D251 Intramural leiomyoma of uterus: Secondary | ICD-10-CM | POA: Diagnosis not present

## 2016-03-21 DIAGNOSIS — Z01411 Encounter for gynecological examination (general) (routine) with abnormal findings: Secondary | ICD-10-CM | POA: Diagnosis not present

## 2016-03-21 DIAGNOSIS — Z6828 Body mass index (BMI) 28.0-28.9, adult: Secondary | ICD-10-CM | POA: Diagnosis not present

## 2016-03-21 DIAGNOSIS — R102 Pelvic and perineal pain: Secondary | ICD-10-CM | POA: Diagnosis not present

## 2016-03-27 DIAGNOSIS — M47816 Spondylosis without myelopathy or radiculopathy, lumbar region: Secondary | ICD-10-CM | POA: Diagnosis not present

## 2016-03-27 DIAGNOSIS — M545 Low back pain: Secondary | ICD-10-CM | POA: Diagnosis not present

## 2016-03-29 ENCOUNTER — Telehealth: Payer: Self-pay | Admitting: Osteopathic Medicine

## 2016-03-29 DIAGNOSIS — M4806 Spinal stenosis, lumbar region: Secondary | ICD-10-CM | POA: Diagnosis not present

## 2016-03-29 DIAGNOSIS — M461 Sacroiliitis, not elsewhere classified: Secondary | ICD-10-CM | POA: Diagnosis not present

## 2016-03-29 DIAGNOSIS — M549 Dorsalgia, unspecified: Secondary | ICD-10-CM | POA: Diagnosis not present

## 2016-03-29 DIAGNOSIS — M48061 Spinal stenosis, lumbar region without neurogenic claudication: Secondary | ICD-10-CM | POA: Insufficient documentation

## 2016-03-29 NOTE — Telephone Encounter (Signed)
-----   Message from Sunnie NielsenNatalie Allisson Schindel, DO sent at 03/14/2016  6:04 PM EDT ----- Regarding: Patient called back Reminder to call inquire about symptoms, control on the Cymbalta medicine, consider increased dose

## 2016-03-29 NOTE — Telephone Encounter (Signed)
Seen by me 2 weeks ago, can we call to check up on how the Cymbalta is doing for depression issues and chronic pain? If she is tolerating the medication well, we have the option to go up to 40 mg if she is not getting adequate relief. Let me know if she like to try the higher dose.

## 2016-03-30 NOTE — Telephone Encounter (Signed)
Called patient left a detailed message on vm with information as noted below. Advised patient to call office back if she wanted to switch to high dose. Aikam Hellickson,CMA

## 2016-04-02 ENCOUNTER — Other Ambulatory Visit: Payer: Self-pay | Admitting: Osteopathic Medicine

## 2016-04-02 DIAGNOSIS — F329 Major depressive disorder, single episode, unspecified: Secondary | ICD-10-CM

## 2016-04-02 DIAGNOSIS — F32A Depression, unspecified: Secondary | ICD-10-CM

## 2016-04-02 DIAGNOSIS — G4700 Insomnia, unspecified: Secondary | ICD-10-CM

## 2016-04-04 ENCOUNTER — Ambulatory Visit (INDEPENDENT_AMBULATORY_CARE_PROVIDER_SITE_OTHER): Payer: BLUE CROSS/BLUE SHIELD | Admitting: Osteopathic Medicine

## 2016-04-04 VITALS — BP 125/78 | HR 49 | Temp 98.2°F | Ht 64.0 in | Wt 165.0 lb

## 2016-04-04 DIAGNOSIS — F32A Depression, unspecified: Secondary | ICD-10-CM

## 2016-04-04 DIAGNOSIS — R001 Bradycardia, unspecified: Secondary | ICD-10-CM | POA: Insufficient documentation

## 2016-04-04 DIAGNOSIS — M549 Dorsalgia, unspecified: Secondary | ICD-10-CM | POA: Diagnosis not present

## 2016-04-04 DIAGNOSIS — G8929 Other chronic pain: Secondary | ICD-10-CM

## 2016-04-04 DIAGNOSIS — R05 Cough: Secondary | ICD-10-CM

## 2016-04-04 DIAGNOSIS — F329 Major depressive disorder, single episode, unspecified: Secondary | ICD-10-CM | POA: Diagnosis not present

## 2016-04-04 DIAGNOSIS — R059 Cough, unspecified: Secondary | ICD-10-CM

## 2016-04-04 DIAGNOSIS — J019 Acute sinusitis, unspecified: Secondary | ICD-10-CM | POA: Diagnosis not present

## 2016-04-04 MED ORDER — AMOXICILLIN-POT CLAVULANATE 875-125 MG PO TABS: 1.0000 | ORAL_TABLET | Freq: Two times a day (BID) | ORAL | 0 refills | Status: DC

## 2016-04-04 MED ORDER — GUAIFENESIN-CODEINE 100-10 MG/5ML PO SOLN: 5.0000 mL | Freq: Four times a day (QID) | ORAL | 0 refills | Status: DC | PRN

## 2016-04-04 MED ORDER — IPRATROPIUM BROMIDE 0.03 % NA SOLN: 2.0000 | Freq: Three times a day (TID) | NASAL | 0 refills | Status: DC | PRN

## 2016-04-04 MED ORDER — BENZONATATE 200 MG PO CAPS
200.0000 mg | ORAL_CAPSULE | Freq: Three times a day (TID) | ORAL | 0 refills | Status: DC | PRN
Start: 2016-04-04 — End: 2016-06-12

## 2016-04-04 NOTE — Progress Notes (Signed)
HPI: Renee Norris is a 50 y.o. female who presents to [facility] [place]  today for chief complaint of:  Chief Complaint  Patient presents with  . Cough   Acute Illness . Context:  Recent URI, got a bit better but now has sinus problems, pain, coughing . Assoc sign/symptoms: see ROS . Duration: 12+ days, worse past 2 nights  Modifying factors: OTC meds not helpful  Depression/Pain - has not started Cymbalta yet, has some concerns about side effects.   Past medical, social and family history reviewed. Current medications and allergies reviewed.    Review of Systems: CONSTITUTIONAL: no fever/chills HEAD/EYES/EARS/NOSE/THROAT: no headache, no vision change or hearing change, no sore throat CARDIAC: No chest pain, No pressure/palpitations RESPIRATORY: yes cough, no shortness of breath GASTROINTESTINAL: no nausea, no vomiting, no abdominal pain, no diarrhea MUSCULOSKELETAL: no myalgia/arthralgia SKIN: no rash   Exam:  BP 125/78   Pulse (!) 49   Temp 98.2 F (36.8 C) (Oral)   Ht 5\' 4"  (1.626 m)   Wt 165 lb (74.8 kg)   LMP 04/21/2014 (Approximate)   BMI 28.32 kg/m  Constitutional: VSS, see above. General Appearance: alert, well-developed, well-nourished, NAD Eyes: Normal lids and conjunctive, non-icteric sclera, PERRLA Ears, Nose, Mouth, Throat: Normal external inspection ears/nares/mouth/lips/gums, normal TM, MMM;       posterior pharynx without erythema, without exudate, nasal mucosa normal Neck: No masses, trachea midline. normal lymph nodes Respiratory: Normal respiratory effort. No  wheeze/rhonchi/rales Cardiovascular: S1/S2 normal, no murmur/rub/gallop auscultated. RRR.    ASSESSMENT/PLAN:   Acute sinusitis, recurrence not specified, unspecified location - likely due to mucus accumulation and infection after acute URI/viral illness Plan: amoxicillin-clavulanate (AUGMENTIN) 875-125 MG tablet, ipratropium (ATROVENT) 0.03 % nasal spray,    Cough - likely postviral Plan: benzonatate (TESSALON) 200 MG capsule, guaiFENesin-codeine 100-10 MG/5ML syrup  Depression - answered questions about Cymbalta - pt will tihnk about starting this with close followup with me.   Chronic back pain  Sinus bradycardia, chronic    Visit summary was printed for the patient with medications and pertinent instructions for patient to review. ER/RTC precautions reviewed. All questions answered. Return if sinus/cough symptoms worsen or fail to improve, for DEPRESSION/PAIN FOLLOWUP if Cymbalta isn't helpful.

## 2016-04-04 NOTE — Patient Instructions (Signed)
DR. Kayen Grabel'S HOME CARE INSTRUCTIONS: VIRAL ILLNESSES - ACHES/PAINS, SORE THROAT, SINUSITIS, COUGH, GASTRITIS   FIRST, A FEW NOTES ON OVER-THE-COUNTER MEDICATIONS!  USE CAUTION - MANY OVER-THE-COUNTER MEDICATIONS COME IN COMBINATIONS OF MULTIPLE GENERIC MEDICINES. FOR INSTANCE, NYQUIL HAS TYLENOL + COUGH MEDICINE + AN ANTIHISTAMINE, SO BE CAREFUL YOU'RE NOT TAKING A COMBINATION MEDICINE WHICH CONTAINS MEDICATIONS YOU'RE ALSO TAKING SEPARATELY (LIKE NYQUIL SYRUP PLUS TYLENOL PILLS).  YOUR PHARMACIST CAN HELP YOU AVOID MEDICATION INTERACTIONS AND DUPLICATIONS - ASK FOR THEIR HELP IF YOU ARE CONFUSED OR UNSURE ABOUT WHAT TO PURCHASE OVER-THE-COUNTER!  REMEMBER - IF YOU'RE EVER CONCERNED ABOUT MEDICATION SIDE EFFECTS, OR IF YOU'RE EVER CONCERNED YOUR SYMPTOMS ARE GETTING WORSE DESPITE TREATMENT, PLEASE CALL THE DOCTOR'S OFFICE! IF AFTER-HOURS, YOU CAN BE SEEN IN URGENT CARE OR, FOR SEVERE ILLNESS, PLEASE GO TO THE EMERGENCY ROOM.   TREAT SINUS CONGESTION, RUNNY NOSE & POSTNASAL DRIP: Treat to increase airflow through sinuses, decrease congestion pain and prevent bacterial growth!  Remember, only 0.5-2% of sinus infections are due to a bacteria, the rest are due to a virus (usually the common cold) which will not get better with antibiotics!  NASAL SPRAYS: generally safe and should not interact with other medicines. Can take any of these medications, either alone or together... FLONASE (FLUTICASONE) - 2 sprays in each nostril twice per day (also a great allergy medicine to use long-term!) AFRIN (OXYMETOLAZONE) - use sparingly because it will cause rebound congestion, NEVER USE IN KIDS  SALINE NASAL SPRAY- no limit, but avoid use after other nasal sprays or it can wash the medicine away PRESCRTIPTION ATROVENT - as directed on prescription bottle  ANTIHISTAMINES: Helps dry out runny nose and decreases postnasal drip. Benadryl may cause drowsiness but other preparations should not be as sedating.  Certain kinds are not as safe in elderly individuals. OK to use unless Dr A says otherwise.  Only use one of the following... BENADRYL (DIPHENHYDRAMINE) - 25-50 mg every 6 hours ZYRTEC (CETIRIZINE) - 5-10 mg daily CLARITIN (LORATIDINE) - less potent. 10 mg daily ALLEGRA (FEXOFENADINE) - least likely to cause drowsiness! 180 mg daily or 60 mg twice per day  DECONGESTANTS: Helps dry out runny nose and helps with sinus pain. May cause insomnia, or sometimes elevated heart rate. Can cause problems if used often in people with high blood pressure. OK to use unless Dr A says otherwise. NEVER USE IN KIDS UNDER 2 YEARS OLD. Only use one of the following... SUDAFED (PSEUDOEPHEDINE) - 60 mg every 4 - 6 hours, also comes in 120 mg extended release every 12 hours, maximum 240 mg in 24 hours.  SUDAED PE (PHENYLEPHRINE) - 10 mg every 4 - 6 hours, maximum 60 mg per day  COMBINATIONS OF ABOVE ANTIHISTAMINES + DECONGESTANTS: these usually require you to show your ID at the pharmacy counter. You can also purchase these medicines separately as noted above.  Only use one of the following... ZYRTEC-D (CETIRIZINE + PSEUDOEPHEDRINE) - 12 hour formulation as directed CLARITIN-D (LORATIDINE + PSEUDOEPHEDRINE) - 12 and 24 hour formulations as directed ALLEGRA-D (FEXOFENADINE + PSEUDOEPHEDRINE) - 12 and 24 hour formulations as directed  PRESCRIPTION TREATMENT FOR SINUS PROBLEMS: Can include nasal sprays, pills, or antibiotics in the case of true bacterial infection. Not everyone needs an antibiotic but there are other medicines which will help you feel better while your body fights the infection!    TREAT COUGH & SORE THROAT: Remember, cough is the body's way of protecting your airways and lungs, it's a hard-wired   reflex that is tough for medicines to treat!  Irritation to the airways will cause cough. This irritation is usually caused by upper airway problems like postnasal drip (treat as above) and viral sore  throat, but in severe cases can be due to lower airway problems like bronchitis or pneumonia, which a doctor can usually hear on exam of your lungs or see on an X-ray. Sore throat is almost always due to a virus, but occasionally caused by certain strains of Strep, which requires antibiotics.  Exercise and smoking may make cough worse - take it easy while you're sick, and QUIT SMOKING!  Cough due to viral infection can linger for 2-3 weeks or so. If you're coughing longer than you think you should, or if the cough is severe, please make an appointment in the office - you may need a chest X-ray.  EXPECTORANT: Used to help clear airways, take these with PLENTY of water to help thin mucus secretions and make the mucus easier to cough up  Only use one of the following... ROBITUSSIN (DEXTROMETHORPHAN OR GUAIFENISEN depending on formulation)  MUCINEX (GUAIFENICEN) - usually longer acting  COUGH DROPS/LOZENGES: Whichever over-the-counter agent you prefer!  Here are some suggestions for ingredients to look for (can take both)... BENZOCAINE - numbing effect, also in CHLORASEPTIC THROAT SPRAY MENTHOL - cooling effect  HONEY: has gone head-to-head in several clinical trials with prescription cough medicines and found to be equally effective! Try 1 Teaspoon Honey + 2 Drops Lemon Juice, as much as you want to use. NONE FOR KIDS UNDER AGE 2  HERBAL TEA: There are certain ingredients which help "coat the throat" to relieve pain  such as ELM BARK, LICORICE ROOT, MARSHMALLOW ROOT  PRESCRIPTION TREATMENT FOR COUGH: Reserved for severe cases. Can include pills, syrups or inhalers.    TREAT ACHES & PAINS, FEVER: Illness causes aches, pains and fever as your body increases its natural inflammation response to help fight the infection.  Rest, good hydration and nutrition, and taking anti-inflammatory medications will help.  Remember: a true fever is a temperature 100.4 or higher. If you have a fever that  is 105.0 or higher, that is a dangerous level and needs medical attention in the office or in the ER!  Can take both of these together if it's ok with your doctor... IBUPROFEN - 400-800 mg every 6 - 8 hours. Ibuprofen and similar medications can cause problems for people with heart disease or history of stomach ulcers, check with Dr A first if you're concerned. Lower doses are usually safe and effective.  TYLENOL (ACETAMINOPHEN) - 500-1000 mg every 6 hours. It won't cause problems with heart or stomach.     TREAT GASTROINTESTINAL SYMPTOMS: Hydrate, hydrate, hydrate! Try drinking Gatorade/Powerade, broth/soup. Avoid milk and juice, these can make diarrhea worse. You can drink water, of course, but if you are also having vomiting and loose stool you are losing electrolytes which water alone can't replace.  IMMODIUM (LOPERAMIDE) - as directed on the bottle to help with loose stool PRESCRIPTION ZOFRAN (ONDANSETRON) OR PHENERGAN (PROMETHAZINE) - as directed to help nausea and vomiting. Try taking these before you eat if you are having trouble keeping food down.     REMEMBER - THE MOST IMPORTANT THINGS YOU CAN DO TO AVOID CATCHING OR SPREADING ILLNESS INCLUDE:  COVER YOUR COUGH WITH YOUR ARM, NOT WITH YOUR HANDS!  DISINFECT COMMONLY USED SURFACES (SUCH AS TELEPHONES & DOORKNOBS) WHEN YOU OR SOMEONE CLOSE TO YOU IS FEELING SICK!  BE SURE   VACCINES ARE UP TO DATE - GET A FLU SHOT EVERY YEAR! THE FLU CAN BE DEADLY FOR BABIES, ELDERLY FOLKS, AND PEOPLE WITH WEAK IMMUNE SYSTEMS - YOU SHOULD BE VACCINATED TO HELP PREVENT YOURSELF FROM GETTING SICK, BUT ALSO TO PREVENT SOMEONE ELSE FROM GETTING AN INFECTION WHICH MAY HOSPITALIZE OR KILL THEM. GOOD NUTRITION AND HEALTHY LIFESTYLE WILL HELP YOUR IMMUNE SYSTEM YEAR-ROUND! THERE IS NO MAGIC SUPPLEMENT! AND ABOVE ALL - WASH YOUR HANDS OFTEN!  

## 2016-04-11 ENCOUNTER — Encounter: Payer: Self-pay | Admitting: Osteopathic Medicine

## 2016-04-11 DIAGNOSIS — G8929 Other chronic pain: Secondary | ICD-10-CM | POA: Diagnosis not present

## 2016-04-11 DIAGNOSIS — M545 Low back pain, unspecified: Secondary | ICD-10-CM | POA: Insufficient documentation

## 2016-04-13 ENCOUNTER — Other Ambulatory Visit: Payer: Self-pay | Admitting: Osteopathic Medicine

## 2016-04-17 DIAGNOSIS — M47816 Spondylosis without myelopathy or radiculopathy, lumbar region: Secondary | ICD-10-CM | POA: Diagnosis not present

## 2016-04-17 DIAGNOSIS — M1288 Other specific arthropathies, not elsewhere classified, other specified site: Secondary | ICD-10-CM | POA: Diagnosis not present

## 2016-04-19 ENCOUNTER — Telehealth: Payer: Self-pay

## 2016-04-19 DIAGNOSIS — Z6828 Body mass index (BMI) 28.0-28.9, adult: Secondary | ICD-10-CM | POA: Diagnosis not present

## 2016-04-19 DIAGNOSIS — M47816 Spondylosis without myelopathy or radiculopathy, lumbar region: Secondary | ICD-10-CM | POA: Diagnosis not present

## 2016-04-19 NOTE — Telephone Encounter (Signed)
Opened in error. Rhonda Cunningham,CMA  

## 2016-04-26 ENCOUNTER — Other Ambulatory Visit: Payer: Self-pay | Admitting: Osteopathic Medicine

## 2016-05-07 ENCOUNTER — Other Ambulatory Visit: Payer: Self-pay | Admitting: Osteopathic Medicine

## 2016-05-07 DIAGNOSIS — J019 Acute sinusitis, unspecified: Secondary | ICD-10-CM

## 2016-05-10 DIAGNOSIS — M47816 Spondylosis without myelopathy or radiculopathy, lumbar region: Secondary | ICD-10-CM | POA: Insufficient documentation

## 2016-05-10 DIAGNOSIS — G8929 Other chronic pain: Secondary | ICD-10-CM | POA: Diagnosis not present

## 2016-05-10 DIAGNOSIS — Z981 Arthrodesis status: Secondary | ICD-10-CM | POA: Diagnosis not present

## 2016-05-15 ENCOUNTER — Encounter: Payer: Self-pay | Admitting: Osteopathic Medicine

## 2016-05-15 DIAGNOSIS — M47816 Spondylosis without myelopathy or radiculopathy, lumbar region: Secondary | ICD-10-CM

## 2016-05-20 ENCOUNTER — Other Ambulatory Visit: Payer: Self-pay | Admitting: Osteopathic Medicine

## 2016-05-20 DIAGNOSIS — J019 Acute sinusitis, unspecified: Secondary | ICD-10-CM

## 2016-05-28 DIAGNOSIS — M4696 Unspecified inflammatory spondylopathy, lumbar region: Secondary | ICD-10-CM | POA: Diagnosis not present

## 2016-05-28 DIAGNOSIS — M1288 Other specific arthropathies, not elsewhere classified, other specified site: Secondary | ICD-10-CM | POA: Diagnosis not present

## 2016-05-28 DIAGNOSIS — M47816 Spondylosis without myelopathy or radiculopathy, lumbar region: Secondary | ICD-10-CM | POA: Diagnosis not present

## 2016-06-12 ENCOUNTER — Other Ambulatory Visit: Payer: Self-pay | Admitting: Osteopathic Medicine

## 2016-06-12 NOTE — Telephone Encounter (Signed)
Patient is requesting refill for Tramadol that was given 07/17. Please advise if ok to refill. Rhonda Cunningham,CMA

## 2016-06-15 ENCOUNTER — Telehealth (INDEPENDENT_AMBULATORY_CARE_PROVIDER_SITE_OTHER): Payer: Self-pay | Admitting: Orthopaedic Surgery

## 2016-06-15 NOTE — Telephone Encounter (Signed)
Patient requesting RX refill on diazepam   Contact Info: [phone]

## 2016-06-15 NOTE — Telephone Encounter (Signed)
Please advise 

## 2016-06-18 NOTE — Telephone Encounter (Signed)
Ucall, she was given flexeril from us , OK to refill flexeril.  She has not had any valium from our office in many months. ucall , thank you

## 2016-06-18 NOTE — Telephone Encounter (Signed)
Patient does not want Flexeril. She states that she takes the Diazepam at night so that she can rest. That is the medicine that she would like filled if possible.  Please advise.

## 2016-06-19 ENCOUNTER — Telehealth: Payer: Self-pay

## 2016-06-19 DIAGNOSIS — G8929 Other chronic pain: Secondary | ICD-10-CM

## 2016-06-19 DIAGNOSIS — M545 Low back pain: Principal | ICD-10-CM

## 2016-06-19 MED ORDER — DIAZEPAM 10 MG PO TABS: 10.0000 mg | ORAL_TABLET | Freq: Every day | ORAL | 0 refills | Status: AC | PRN

## 2016-06-19 NOTE — Telephone Encounter (Signed)
noted 

## 2016-06-19 NOTE — Telephone Encounter (Signed)
Patient called requested a Rx for Diazapam 10 mg.Patient stated that she was getting it from a previous doctor. Please advise. Sherrill Mckamie,CMA

## 2016-06-19 NOTE — Telephone Encounter (Signed)
She has gotten these from me in the past. [place] controlled substance database reviewed, last fill of this medication was in June 2017, refill is appropriate. Prescription was printed

## 2016-06-19 NOTE — Telephone Encounter (Signed)
I called and discussed. Sorry not a good idea to start taking benzo's for sleep problems. She will talk to her LMD. Lorain ChildesFYI

## 2016-06-20 NOTE — Telephone Encounter (Signed)
Rx has been faxed to pharmacy. Ayushi Pla,CMA  

## 2016-07-09 ENCOUNTER — Other Ambulatory Visit (INDEPENDENT_AMBULATORY_CARE_PROVIDER_SITE_OTHER): Payer: Self-pay | Admitting: Radiology

## 2016-07-09 ENCOUNTER — Telehealth (INDEPENDENT_AMBULATORY_CARE_PROVIDER_SITE_OTHER): Payer: Self-pay | Admitting: Radiology

## 2016-07-10 DIAGNOSIS — M47816 Spondylosis without myelopathy or radiculopathy, lumbar region: Secondary | ICD-10-CM | POA: Diagnosis not present

## 2016-07-10 DIAGNOSIS — M4696 Unspecified inflammatory spondylopathy, lumbar region: Secondary | ICD-10-CM | POA: Diagnosis not present

## 2016-07-10 DIAGNOSIS — M1288 Other specific arthropathies, not elsewhere classified, other specified site: Secondary | ICD-10-CM | POA: Diagnosis not present

## 2016-07-16 DIAGNOSIS — M47816 Spondylosis without myelopathy or radiculopathy, lumbar region: Secondary | ICD-10-CM | POA: Diagnosis not present

## 2016-08-15 ENCOUNTER — Ambulatory Visit (INDEPENDENT_AMBULATORY_CARE_PROVIDER_SITE_OTHER): Payer: BLUE CROSS/BLUE SHIELD | Admitting: Osteopathic Medicine

## 2016-08-15 ENCOUNTER — Telehealth: Payer: Self-pay | Admitting: Osteopathic Medicine

## 2016-08-15 VITALS — BP 121/85 | HR 66 | Temp 97.9°F | Wt 169.0 lb

## 2016-08-15 DIAGNOSIS — H6123 Impacted cerumen, bilateral: Secondary | ICD-10-CM | POA: Diagnosis not present

## 2016-08-15 DIAGNOSIS — R69 Illness, unspecified: Secondary | ICD-10-CM

## 2016-08-15 DIAGNOSIS — J111 Influenza due to unidentified influenza virus with other respiratory manifestations: Secondary | ICD-10-CM

## 2016-08-15 DIAGNOSIS — R195 Other fecal abnormalities: Secondary | ICD-10-CM

## 2016-08-15 DIAGNOSIS — J069 Acute upper respiratory infection, unspecified: Secondary | ICD-10-CM | POA: Diagnosis not present

## 2016-08-15 DIAGNOSIS — B9789 Other viral agents as the cause of diseases classified elsewhere: Secondary | ICD-10-CM

## 2016-08-15 MED ORDER — OSELTAMIVIR PHOSPHATE 75 MG PO CAPS: 75.0000 mg | ORAL_CAPSULE | Freq: Two times a day (BID) | ORAL | 0 refills | Status: DC

## 2016-08-15 NOTE — Telephone Encounter (Signed)
Had the flu shot at [facility] in [place] in August 2017.(note came via mychart).

## 2016-08-15 NOTE — Telephone Encounter (Signed)
I did not see that documentation anywhere in Care everywhere so I will verify with patient when she comes in today for her OV. Rhonda Cunningham,CMA

## 2016-08-15 NOTE — Progress Notes (Signed)
HPI: Renee Norris is a 50 y.o. female who presents to [facility] [place] [facility]15/16 for chief complaint of:  Chief Complaint  Patient presents with  . Cough  . Fatigue    Acute Illness: . Context: no flu exposure she knows of  . Quality: tired, coughing, runny nose, sneezing, diarrhea  . Assoc signs/symptoms: see ROS . Duration: 1 days    Past medical, social and family history reviewed. Current medications and allergies reviewed.     Review of Systems:  Constitutional: subjective fever/chills  HEENT: Yes  headache, Yes  sore throat, No  swollen glands  Cardiovascular: No chest pain  Respiratory:Yes  cough, No  shortness of breath  Gastrointestinal: Yes  nausea, No  vomiting,  Yes  diarrhea  Musculoskeletal:   No  myalgia/arthralgia  Skin/Integument:  No  rash   Exam:  BP 121/85   Pulse 66   Temp 97.9 F (36.6 C) (Oral)   Wt 169 lb (76.7 kg)   LMP 04/21/2014 (Approximate)   BMI 29.01 kg/m   Constitutional: VSS, see above. General Appearance: alert, well-developed, well-nourished, NAD  Eyes: Normal lids and conjunctive, non-icteric sclera, PERRLA  Ears, Nose, Mouth, Throat: Normal external inspection ears/nares/mouth/lips/gums, unable to visualize TM - pt unable to tolerate flushing, visible canal andTM appear normal bilaterally, no associated lymphadenopathy TM, MMM; posterior pharynx with erythema, without exudate, nasal mucosa normal  Neck: No masses, trachea midline. normal lymph nodes  Respiratory: Normal respiratory effort. No  wheeze/rhonchi/rales  Cardiovascular: S1/S2 normal, no murmur/rub/gallop auscultated. RRR.    ASSESSMENT/PLAN: We are currently out of flu swabs, patient states that she knows where she can get flu test done, printed prescription for Tamiflu written for patient to fill if positive or if getting worse in the next day or so, she is well within the 48 hour window to start this medication. Patient  declines prescription medication for other symptoms  Viral URI with cough - Plan: oseltamivir (TAMIFLU) 75 MG capsule  Loose stools - Plan: oseltamivir (TAMIFLU) 75 MG capsule  Impacted cerumen, bilateral - Patient unable to tolerate flushing, no signs of infection, Debrox, reevaluate as needed  Influenza-like illness - Plan: oseltamivir (TAMIFLU) 75 MG capsule     Patient Instructions  Note: the following list assumes no pregnancy, normal liver & kidney function and no other drug interactions. Dr. Lyn HollingsheadAlexander has highlighted medications which are safe for you to use, but these may not be appropriate for everyone. Always ask a pharmacist or qualified medical provider if there are any questions!    Aches/Pains, Fever Acetaminophen (Tylenol) 500 mg tablets - take max 2 tablets (1000 mg) every 6 hours (4 times per day)  Ibuprofen (Motrin) 200 mg tablets - take max 4 tablets (800 mg) every 6 hours  Sinus Congestion & Ear Discomfort Prescription Atrovent Cromolyn Nasal Spray (NasalCrom) 1 spray each nostril 3-4 times per day, max 6 imes per day Nasal Saline if desired Oxymetolazone (Afrin, others) sparing use due to rebound congestion Phenylephrine (Sudafed) 10 mg tablets every 4 hours (or the 12-hour formulation) Diphenhydramine (Benadryl) 25 mg tablets - take max 2 tablets every 4 hours Debrox ear drops to soften wax  Cough & Sore Throat Prescription cough pills or syrups Dextromethorphan (Robitussin, others) - cough suppressant Guaifenesin (Robitussin, Mucinex, others) - expectorant (helps cough up mucus) (Dextromethorphan and Guaifenesin also come in a combination tablet) Lozenges w/ Benzocaine + Menthol (Cepacol) Honey - as much as you want! Teas which "coat the throat" -  look for ingredients Elm Bark, Licorice Root, Marshmallow Root  GI:  Prescription nausea medicine Loperamide (Imodium) - for diarrhea, try taking before meals Bismuth subsalicylate (Pepto Bismol) - for GI  upset, diarrhea  Other Zinc Lozenges within 24 hours of symptoms onset - mixed evidence this shortens the duration of the common cold Don't waste your money on Vitamin C or Echinacea       Visit summary was printed for the patient with medications and pertinent instructions for patient to review. ER/RTC precautions reviewed. All questions answered. Return if symptoms worsen or fail to improve.

## 2016-08-15 NOTE — Patient Instructions (Addendum)
Note: the following list assumes no pregnancy, normal liver & kidney function and no other drug interactions. Dr. Lyn HollingsheadAlexander has highlighted medications which are safe for you to use, but these may not be appropriate for everyone. Always ask a pharmacist or qualified medical provider if there are any questions!    Aches/Pains, Fever Acetaminophen (Tylenol) 500 mg tablets - take max 2 tablets (1000 mg) every 6 hours (4 times per day)  Ibuprofen (Motrin) 200 mg tablets - take max 4 tablets (800 mg) every 6 hours  Sinus Congestion & Ear Discomfort Prescription Atrovent Cromolyn Nasal Spray (NasalCrom) 1 spray each nostril 3-4 times per day, max 6 imes per day Nasal Saline if desired Oxymetolazone (Afrin, others) sparing use due to rebound congestion Phenylephrine (Sudafed) 10 mg tablets every 4 hours (or the 12-hour formulation) Diphenhydramine (Benadryl) 25 mg tablets - take max 2 tablets every 4 hours Debrox ear drops to soften wax  Cough & Sore Throat Prescription cough pills or syrups Dextromethorphan (Robitussin, others) - cough suppressant Guaifenesin (Robitussin, Mucinex, others) - expectorant (helps cough up mucus) (Dextromethorphan and Guaifenesin also come in a combination tablet) Lozenges w/ Benzocaine + Menthol (Cepacol) Honey - as much as you want! Teas which "coat the throat" - look for ingredients Elm Bark, Licorice Root, Marshmallow Root  GI:  Prescription nausea medicine Loperamide (Imodium) - for diarrhea, try taking before meals Bismuth subsalicylate (Pepto Bismol) - for GI upset, diarrhea  Other Zinc Lozenges within 24 hours of symptoms onset - mixed evidence this shortens the duration of the common cold Don't waste your money on Vitamin C or Echinacea

## 2016-08-17 ENCOUNTER — Telehealth: Payer: Self-pay

## 2016-08-17 NOTE — Telephone Encounter (Signed)
Patient called stated she is very nauseated  and vomiting she is requesting a Rx for nausea medication [facility] to [facility]. Please advise. Rhonda Cunningham,CMA

## 2016-08-21 ENCOUNTER — Other Ambulatory Visit: Payer: Self-pay | Admitting: Osteopathic Medicine

## 2016-08-21 DIAGNOSIS — J019 Acute sinusitis, unspecified: Secondary | ICD-10-CM

## 2016-08-21 NOTE — Telephone Encounter (Signed)
Attempted to contact Pt for update. Left VM for Pt to return clinic call, callback information provided.

## 2016-08-21 NOTE — Telephone Encounter (Signed)
Sorry was out of office - can we call pt and check up on her? If still feeing nauseated I can send something.

## 2016-08-23 ENCOUNTER — Other Ambulatory Visit (HOSPITAL_COMMUNITY)
Admission: RE | Admit: 2016-08-23 | Discharge: 2016-08-23 | Disposition: A | Discharge status: Home / Self Care | Payer: BLUE CROSS/BLUE SHIELD | Source: Ambulatory Visit | Attending: Osteopathic Medicine | Admitting: Osteopathic Medicine

## 2016-08-23 ENCOUNTER — Ambulatory Visit (INDEPENDENT_AMBULATORY_CARE_PROVIDER_SITE_OTHER): Payer: BLUE CROSS/BLUE SHIELD | Admitting: Osteopathic Medicine

## 2016-08-23 ENCOUNTER — Encounter: Payer: Self-pay | Admitting: Osteopathic Medicine

## 2016-08-23 VITALS — BP 122/67 | HR 64 | Ht 64.5 in | Wt 170.0 lb

## 2016-08-23 DIAGNOSIS — Z1151 Encounter for screening for human papillomavirus (HPV): Secondary | ICD-10-CM | POA: Diagnosis not present

## 2016-08-23 DIAGNOSIS — Z124 Encounter for screening for malignant neoplasm of cervix: Secondary | ICD-10-CM

## 2016-08-23 DIAGNOSIS — N952 Postmenopausal atrophic vaginitis: Secondary | ICD-10-CM | POA: Insufficient documentation

## 2016-08-23 DIAGNOSIS — Z01419 Encounter for gynecological examination (general) (routine) without abnormal findings: Secondary | ICD-10-CM | POA: Diagnosis not present

## 2016-08-23 NOTE — Progress Notes (Signed)
HPI: Renee Norris is a 51 y.o. female  who presents to [facility] today, 08/23/16,  for chief complaint of:  Chief Complaint  Patient presents with  . Gynecologic Exam     GYN: Overdue for Pap smear, is up-to-date with annual physical within the past 12 months. No previous Pap results available for review, patient states has been several years since GYN exam.   Past medical, surgical, social and family history reviewed: Patient Active Problem List   Diagnosis Date Noted  . 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 Sinus bradycardia, chronic 04/04/2016  . H/O mammogram 02/14/2016  . History of bradycardia 02/14/2016  . Chronic back pain 01/30/2016  . Depression 01/30/2016  . Insomnia 01/30/2016  . Cephalalgia 01/30/2016  . Medication management 01/30/2016  . Other fatigue 01/30/2016  . Postmenopausal 01/30/2016   Past Surgical History:  Procedure Laterality Date  . ABDOMINAL SURGERY    . BREAST SURGERY    . GYN surgery    . TONSILLECTOMY     Social History  Substance Use Topics  . Smoking status: Former Games developer  . Smokeless tobacco: Never Used  . Alcohol use No     Comment: 2-3 glasses of wine a day   Family History  Problem Relation Age of Onset  . Diabetes Mother   . Hypertension Mother   . Cancer Mother   . Diabetes Father   . Hypertension Father   . Cancer Father      Current medication list and allergy/intolerance information reviewed:   Current Outpatient Prescriptions on File Prior to Visit  Medication Sig Dispense Refill  . amitriptyline (ELAVIL) 50 MG tablet TAKE 1 TABLET 50 MG BY MOUTH AT BEDTIME. START 1/2 TABLET 25MG  BY MOUTH T BEDTIME FOR THE FIRST WEEK 30 tablet 1  . aspirin 81 MG tablet Take 81 mg by mouth daily.    Marland Kitchen BLACK PEPPER-TURMERIC PO Take by mouth.    . Cholecalciferol (VITAMIN D-3 PO) Take by mouth.    . cyclobenzaprine (FLEXERIL) 5 MG tablet TAKE 1 TABLET BY  MOUTH TWICE A DAY AS NEEDED FOR SPASM  0  . DULoxetine (CYMBALTA) 20 MG capsule TAKE 1 CAPSULE (20 MG TOTAL) BY MOUTH DAILY. 30 capsule 1  . estradiol (ESTRACE) 0.5 MG tablet Take 0.5 mg by mouth daily.  0  . ipratropium (ATROVENT) 0.03 % nasal spray PLACE 2 SPRAYS INTO BOTH NOSTRILS THREE TIMES A DAY AS NEEDED FOR RHINITIS 30 mL 0  . Menaquinone-7 (VITAMIN K2 PO) Take by mouth.    . Omega-3 Fatty Acids (OMEGA 3 PO) Take by mouth.    . Pregnenolone POWD by Does not apply route.    . progesterone (PROMETRIUM) 100 MG capsule Take 200 mg by mouth daily.  0  . topiramate (TOPAMAX) 25 MG tablet 1TAB NIGHTLY X14 DAYS THNE 2TABS NIGHTLY THEREAFTER 60 tablet 2  . traMADol (ULTRAM) 50 MG tablet TAKE 1 TABLET BY MOUTH EVERY 12 HOURS AS NEEDED FOR PAIN 50 tablet 2   No current facility-administered medications on file prior to visit.    No Known Allergies    Review of Systems:  Constitutional: No recent illness  Genitourinary: No unusual vaginal discharge/bleeding.   Exam:  BP 122/67   Pulse 64   Ht 5' 4.5" (1.638 m)   Wt 170 lb (77.1 kg)   LMP 04/21/2014 (Approximate)   BMI 28.73 kg/m   Constitutional: VS see above. General Appearance: alert, well-developed,  well-nourished, NAD  Skin: warm, dry, intact.   Psychiatric: Normal judgment/insight. Normal mood and affect. Oriented x3.  GYN: No lesions/ulcers to external genitalia, normal urethra, atrophic vaginal mucosa, physiologic discharge, cervix normal without lesions, uterus not enlarged or tender, adnexa no masses and nontender     ASSESSMENT/PLAN:   Cervical cancer screening - Plan: Cytology - PAP  Postmenopausal atrophic vaginitis      Visit summary with medication list and pertinent instructions was printed for patient to review. All questions at time of visit were answered - patient instructed to contact office with any additional concerns. ER/RTC precautions were reviewed with the patient. Follow-up plan: Return for  routine care as directed, due for annual in 02/2017.

## 2016-08-27 LAB — CYTOLOGY - PAP
Adequacy: ABSENT
Diagnosis: NEGATIVE
HPV: NOT DETECTED

## 2016-09-18 ENCOUNTER — Other Ambulatory Visit: Payer: Self-pay | Admitting: Osteopathic Medicine

## 2016-10-23 ENCOUNTER — Other Ambulatory Visit: Payer: Self-pay | Admitting: Internal Medicine

## 2016-10-23 DIAGNOSIS — Z006 Encounter for examination for normal comparison and control in clinical research program: Secondary | ICD-10-CM

## 2016-10-27 ENCOUNTER — Emergency Department
Admission: EM | Admit: 2016-10-27 | Discharge: 2016-10-27 | Disposition: A | Discharge status: Other Acute Inpatient Hospital | Payer: Self-pay

## 2016-10-27 DIAGNOSIS — R1013 Epigastric pain: Secondary | ICD-10-CM | POA: Diagnosis not present

## 2016-10-27 DIAGNOSIS — R070 Pain in throat: Secondary | ICD-10-CM | POA: Diagnosis not present

## 2016-10-27 DIAGNOSIS — J029 Acute pharyngitis, unspecified: Secondary | ICD-10-CM | POA: Diagnosis not present

## 2016-10-27 DIAGNOSIS — Z79899 Other long term (current) drug therapy: Secondary | ICD-10-CM | POA: Diagnosis not present

## 2016-11-02 ENCOUNTER — Ambulatory Visit
Admission: RE | Admit: 2016-11-02 | Discharge: 2016-11-02 | Disposition: A | Discharge status: Home / Self Care | Payer: No Typology Code available for payment source | Source: Ambulatory Visit | Attending: Internal Medicine | Admitting: Internal Medicine

## 2016-11-02 DIAGNOSIS — M5126 Other intervertebral disc displacement, lumbar region: Secondary | ICD-10-CM | POA: Diagnosis not present

## 2016-11-02 DIAGNOSIS — Z006 Encounter for examination for normal comparison and control in clinical research program: Secondary | ICD-10-CM

## 2016-11-02 NOTE — Telephone Encounter (Signed)
error 

## 2016-11-20 ENCOUNTER — Other Ambulatory Visit: Payer: Self-pay | Admitting: Osteopathic Medicine

## 2016-11-20 NOTE — Telephone Encounter (Signed)
Controlled substance database reviewed, last filled in November 2017 for 30 tablets, okay to refill now. Not sure which pharmacy was requesting this, prescription is signed and in your basket

## 2016-11-20 NOTE — Telephone Encounter (Signed)
Please see refill request below and let me know if you approve. Zekiah Coen,CMA

## 2016-11-21 DIAGNOSIS — M791 Myalgia: Secondary | ICD-10-CM | POA: Diagnosis not present

## 2016-11-21 DIAGNOSIS — M542 Cervicalgia: Secondary | ICD-10-CM | POA: Diagnosis not present

## 2016-11-21 DIAGNOSIS — M7601 Gluteal tendinitis, right hip: Secondary | ICD-10-CM | POA: Diagnosis not present

## 2016-11-21 DIAGNOSIS — M5137 Other intervertebral disc degeneration, lumbosacral region: Secondary | ICD-10-CM | POA: Diagnosis not present

## 2016-11-21 DIAGNOSIS — M545 Low back pain: Secondary | ICD-10-CM | POA: Diagnosis not present

## 2016-11-21 DIAGNOSIS — M9904 Segmental and somatic dysfunction of sacral region: Secondary | ICD-10-CM | POA: Diagnosis not present

## 2016-11-21 DIAGNOSIS — M47817 Spondylosis without myelopathy or radiculopathy, lumbosacral region: Secondary | ICD-10-CM | POA: Diagnosis not present

## 2016-11-29 DIAGNOSIS — M4603 Spinal enthesopathy, cervicothoracic region: Secondary | ICD-10-CM | POA: Diagnosis not present

## 2016-11-29 DIAGNOSIS — M9904 Segmental and somatic dysfunction of sacral region: Secondary | ICD-10-CM | POA: Diagnosis not present

## 2016-11-29 DIAGNOSIS — M47817 Spondylosis without myelopathy or radiculopathy, lumbosacral region: Secondary | ICD-10-CM | POA: Diagnosis not present

## 2016-11-29 DIAGNOSIS — M5137 Other intervertebral disc degeneration, lumbosacral region: Secondary | ICD-10-CM | POA: Diagnosis not present

## 2016-11-29 DIAGNOSIS — M5383 Other specified dorsopathies, cervicothoracic region: Secondary | ICD-10-CM | POA: Diagnosis not present

## 2016-11-29 DIAGNOSIS — M7601 Gluteal tendinitis, right hip: Secondary | ICD-10-CM | POA: Diagnosis not present

## 2016-12-04 DIAGNOSIS — M5441 Lumbago with sciatica, right side: Secondary | ICD-10-CM | POA: Diagnosis not present

## 2016-12-04 DIAGNOSIS — M4607 Spinal enthesopathy, lumbosacral region: Secondary | ICD-10-CM | POA: Diagnosis not present

## 2016-12-04 DIAGNOSIS — M47817 Spondylosis without myelopathy or radiculopathy, lumbosacral region: Secondary | ICD-10-CM | POA: Diagnosis not present

## 2016-12-04 DIAGNOSIS — M5137 Other intervertebral disc degeneration, lumbosacral region: Secondary | ICD-10-CM | POA: Diagnosis not present

## 2016-12-04 DIAGNOSIS — M7601 Gluteal tendinitis, right hip: Secondary | ICD-10-CM | POA: Diagnosis not present

## 2016-12-04 DIAGNOSIS — M9904 Segmental and somatic dysfunction of sacral region: Secondary | ICD-10-CM | POA: Diagnosis not present

## 2016-12-04 DIAGNOSIS — M542 Cervicalgia: Secondary | ICD-10-CM | POA: Diagnosis not present

## 2016-12-04 DIAGNOSIS — M62830 Muscle spasm of back: Secondary | ICD-10-CM | POA: Diagnosis not present

## 2016-12-04 DIAGNOSIS — M5387 Other specified dorsopathies, lumbosacral region: Secondary | ICD-10-CM | POA: Diagnosis not present

## 2016-12-05 DIAGNOSIS — M5441 Lumbago with sciatica, right side: Secondary | ICD-10-CM | POA: Diagnosis not present

## 2016-12-05 DIAGNOSIS — M4603 Spinal enthesopathy, cervicothoracic region: Secondary | ICD-10-CM | POA: Diagnosis not present

## 2016-12-05 DIAGNOSIS — M47817 Spondylosis without myelopathy or radiculopathy, lumbosacral region: Secondary | ICD-10-CM | POA: Diagnosis not present

## 2016-12-05 DIAGNOSIS — M62830 Muscle spasm of back: Secondary | ICD-10-CM | POA: Diagnosis not present

## 2016-12-05 DIAGNOSIS — M5137 Other intervertebral disc degeneration, lumbosacral region: Secondary | ICD-10-CM | POA: Diagnosis not present

## 2016-12-05 DIAGNOSIS — M5383 Other specified dorsopathies, cervicothoracic region: Secondary | ICD-10-CM | POA: Diagnosis not present

## 2016-12-05 DIAGNOSIS — M5136 Other intervertebral disc degeneration, lumbar region: Secondary | ICD-10-CM | POA: Diagnosis not present

## 2016-12-05 DIAGNOSIS — M7601 Gluteal tendinitis, right hip: Secondary | ICD-10-CM | POA: Diagnosis not present

## 2016-12-05 DIAGNOSIS — M9904 Segmental and somatic dysfunction of sacral region: Secondary | ICD-10-CM | POA: Diagnosis not present

## 2016-12-05 DIAGNOSIS — M47816 Spondylosis without myelopathy or radiculopathy, lumbar region: Secondary | ICD-10-CM | POA: Diagnosis not present

## 2016-12-06 DIAGNOSIS — M47817 Spondylosis without myelopathy or radiculopathy, lumbosacral region: Secondary | ICD-10-CM | POA: Diagnosis not present

## 2016-12-06 DIAGNOSIS — M9904 Segmental and somatic dysfunction of sacral region: Secondary | ICD-10-CM | POA: Diagnosis not present

## 2016-12-06 DIAGNOSIS — M5137 Other intervertebral disc degeneration, lumbosacral region: Secondary | ICD-10-CM | POA: Diagnosis not present

## 2016-12-06 DIAGNOSIS — M7601 Gluteal tendinitis, right hip: Secondary | ICD-10-CM | POA: Diagnosis not present

## 2016-12-11 DIAGNOSIS — M47817 Spondylosis without myelopathy or radiculopathy, lumbosacral region: Secondary | ICD-10-CM | POA: Diagnosis not present

## 2016-12-11 DIAGNOSIS — M4607 Spinal enthesopathy, lumbosacral region: Secondary | ICD-10-CM | POA: Diagnosis not present

## 2016-12-11 DIAGNOSIS — M5387 Other specified dorsopathies, lumbosacral region: Secondary | ICD-10-CM | POA: Diagnosis not present

## 2016-12-11 DIAGNOSIS — M7601 Gluteal tendinitis, right hip: Secondary | ICD-10-CM | POA: Diagnosis not present

## 2016-12-11 DIAGNOSIS — M5137 Other intervertebral disc degeneration, lumbosacral region: Secondary | ICD-10-CM | POA: Diagnosis not present

## 2016-12-11 DIAGNOSIS — M9904 Segmental and somatic dysfunction of sacral region: Secondary | ICD-10-CM | POA: Diagnosis not present

## 2016-12-12 ENCOUNTER — Telehealth: Payer: Self-pay

## 2016-12-12 DIAGNOSIS — M5383 Other specified dorsopathies, cervicothoracic region: Secondary | ICD-10-CM | POA: Diagnosis not present

## 2016-12-12 DIAGNOSIS — M47817 Spondylosis without myelopathy or radiculopathy, lumbosacral region: Secondary | ICD-10-CM | POA: Diagnosis not present

## 2016-12-12 DIAGNOSIS — M9904 Segmental and somatic dysfunction of sacral region: Secondary | ICD-10-CM | POA: Diagnosis not present

## 2016-12-12 DIAGNOSIS — M4603 Spinal enthesopathy, cervicothoracic region: Secondary | ICD-10-CM | POA: Diagnosis not present

## 2016-12-12 DIAGNOSIS — M5137 Other intervertebral disc degeneration, lumbosacral region: Secondary | ICD-10-CM | POA: Diagnosis not present

## 2016-12-12 DIAGNOSIS — M7601 Gluteal tendinitis, right hip: Secondary | ICD-10-CM | POA: Diagnosis not present

## 2016-12-12 MED ORDER — ONDANSETRON 8 MG PO TBDP: 8.0000 mg | ORAL_TABLET | Freq: Three times a day (TID) | ORAL | 0 refills | Status: DC | PRN

## 2016-12-12 NOTE — Telephone Encounter (Signed)
Please call patient: I sent in some medication - if symptoms persist she should come into the office for further evaluation.

## 2016-12-12 NOTE — Telephone Encounter (Signed)
Patient called stated that she is having nausea, she is requesting a Rx be sent to her pharmacy for nausea. Please advise. Renee Norris,CMA

## 2016-12-12 NOTE — Telephone Encounter (Signed)
Patient has been informed. Renee Norris,CMA  

## 2016-12-13 DIAGNOSIS — M4607 Spinal enthesopathy, lumbosacral region: Secondary | ICD-10-CM | POA: Diagnosis not present

## 2016-12-13 DIAGNOSIS — M7601 Gluteal tendinitis, right hip: Secondary | ICD-10-CM | POA: Diagnosis not present

## 2016-12-13 DIAGNOSIS — M5137 Other intervertebral disc degeneration, lumbosacral region: Secondary | ICD-10-CM | POA: Diagnosis not present

## 2016-12-13 DIAGNOSIS — M9904 Segmental and somatic dysfunction of sacral region: Secondary | ICD-10-CM | POA: Diagnosis not present

## 2016-12-13 DIAGNOSIS — M47817 Spondylosis without myelopathy or radiculopathy, lumbosacral region: Secondary | ICD-10-CM | POA: Diagnosis not present

## 2016-12-13 DIAGNOSIS — M5387 Other specified dorsopathies, lumbosacral region: Secondary | ICD-10-CM | POA: Diagnosis not present

## 2016-12-18 DIAGNOSIS — M5383 Other specified dorsopathies, cervicothoracic region: Secondary | ICD-10-CM | POA: Diagnosis not present

## 2016-12-18 DIAGNOSIS — M5137 Other intervertebral disc degeneration, lumbosacral region: Secondary | ICD-10-CM | POA: Diagnosis not present

## 2016-12-18 DIAGNOSIS — M4603 Spinal enthesopathy, cervicothoracic region: Secondary | ICD-10-CM | POA: Diagnosis not present

## 2016-12-18 DIAGNOSIS — M9904 Segmental and somatic dysfunction of sacral region: Secondary | ICD-10-CM | POA: Diagnosis not present

## 2016-12-18 DIAGNOSIS — M7601 Gluteal tendinitis, right hip: Secondary | ICD-10-CM | POA: Diagnosis not present

## 2016-12-18 DIAGNOSIS — M47817 Spondylosis without myelopathy or radiculopathy, lumbosacral region: Secondary | ICD-10-CM | POA: Diagnosis not present

## 2016-12-19 DIAGNOSIS — M47817 Spondylosis without myelopathy or radiculopathy, lumbosacral region: Secondary | ICD-10-CM | POA: Diagnosis not present

## 2016-12-19 DIAGNOSIS — M7601 Gluteal tendinitis, right hip: Secondary | ICD-10-CM | POA: Diagnosis not present

## 2016-12-19 DIAGNOSIS — M9904 Segmental and somatic dysfunction of sacral region: Secondary | ICD-10-CM | POA: Diagnosis not present

## 2016-12-19 DIAGNOSIS — M5137 Other intervertebral disc degeneration, lumbosacral region: Secondary | ICD-10-CM | POA: Diagnosis not present

## 2016-12-20 DIAGNOSIS — M5137 Other intervertebral disc degeneration, lumbosacral region: Secondary | ICD-10-CM | POA: Diagnosis not present

## 2016-12-20 DIAGNOSIS — M47817 Spondylosis without myelopathy or radiculopathy, lumbosacral region: Secondary | ICD-10-CM | POA: Diagnosis not present

## 2016-12-20 DIAGNOSIS — M7601 Gluteal tendinitis, right hip: Secondary | ICD-10-CM | POA: Diagnosis not present

## 2016-12-20 DIAGNOSIS — M9904 Segmental and somatic dysfunction of sacral region: Secondary | ICD-10-CM | POA: Diagnosis not present

## 2016-12-20 DIAGNOSIS — M5387 Other specified dorsopathies, lumbosacral region: Secondary | ICD-10-CM | POA: Diagnosis not present

## 2016-12-20 DIAGNOSIS — M4607 Spinal enthesopathy, lumbosacral region: Secondary | ICD-10-CM | POA: Diagnosis not present

## 2016-12-25 DIAGNOSIS — M9904 Segmental and somatic dysfunction of sacral region: Secondary | ICD-10-CM | POA: Diagnosis not present

## 2016-12-25 DIAGNOSIS — M5137 Other intervertebral disc degeneration, lumbosacral region: Secondary | ICD-10-CM | POA: Diagnosis not present

## 2016-12-25 DIAGNOSIS — M47817 Spondylosis without myelopathy or radiculopathy, lumbosacral region: Secondary | ICD-10-CM | POA: Diagnosis not present

## 2016-12-25 DIAGNOSIS — M7601 Gluteal tendinitis, right hip: Secondary | ICD-10-CM | POA: Diagnosis not present

## 2016-12-26 DIAGNOSIS — M4603 Spinal enthesopathy, cervicothoracic region: Secondary | ICD-10-CM | POA: Diagnosis not present

## 2016-12-26 DIAGNOSIS — M9904 Segmental and somatic dysfunction of sacral region: Secondary | ICD-10-CM | POA: Diagnosis not present

## 2016-12-26 DIAGNOSIS — M7601 Gluteal tendinitis, right hip: Secondary | ICD-10-CM | POA: Diagnosis not present

## 2016-12-26 DIAGNOSIS — M5137 Other intervertebral disc degeneration, lumbosacral region: Secondary | ICD-10-CM | POA: Diagnosis not present

## 2016-12-26 DIAGNOSIS — M5383 Other specified dorsopathies, cervicothoracic region: Secondary | ICD-10-CM | POA: Diagnosis not present

## 2016-12-26 DIAGNOSIS — M47817 Spondylosis without myelopathy or radiculopathy, lumbosacral region: Secondary | ICD-10-CM | POA: Diagnosis not present

## 2016-12-27 DIAGNOSIS — M5137 Other intervertebral disc degeneration, lumbosacral region: Secondary | ICD-10-CM | POA: Diagnosis not present

## 2016-12-27 DIAGNOSIS — M546 Pain in thoracic spine: Secondary | ICD-10-CM | POA: Diagnosis not present

## 2016-12-27 DIAGNOSIS — M7601 Gluteal tendinitis, right hip: Secondary | ICD-10-CM | POA: Diagnosis not present

## 2016-12-27 DIAGNOSIS — M9904 Segmental and somatic dysfunction of sacral region: Secondary | ICD-10-CM | POA: Diagnosis not present

## 2016-12-27 DIAGNOSIS — M545 Low back pain: Secondary | ICD-10-CM | POA: Diagnosis not present

## 2016-12-27 DIAGNOSIS — M5136 Other intervertebral disc degeneration, lumbar region: Secondary | ICD-10-CM | POA: Diagnosis not present

## 2016-12-27 DIAGNOSIS — M47817 Spondylosis without myelopathy or radiculopathy, lumbosacral region: Secondary | ICD-10-CM | POA: Diagnosis not present

## 2016-12-27 DIAGNOSIS — M62830 Muscle spasm of back: Secondary | ICD-10-CM | POA: Diagnosis not present

## 2017-01-01 DIAGNOSIS — M533 Sacrococcygeal disorders, not elsewhere classified: Secondary | ICD-10-CM | POA: Diagnosis not present

## 2017-01-01 DIAGNOSIS — M5136 Other intervertebral disc degeneration, lumbar region: Secondary | ICD-10-CM | POA: Diagnosis not present

## 2017-01-01 DIAGNOSIS — Z87891 Personal history of nicotine dependence: Secondary | ICD-10-CM | POA: Diagnosis not present

## 2017-01-01 DIAGNOSIS — M545 Low back pain: Secondary | ICD-10-CM | POA: Diagnosis not present

## 2017-01-01 DIAGNOSIS — Z886 Allergy status to analgesic agent status: Secondary | ICD-10-CM | POA: Diagnosis not present

## 2017-01-01 DIAGNOSIS — G8929 Other chronic pain: Secondary | ICD-10-CM | POA: Diagnosis not present

## 2017-01-02 DIAGNOSIS — M7601 Gluteal tendinitis, right hip: Secondary | ICD-10-CM | POA: Diagnosis not present

## 2017-01-02 DIAGNOSIS — M5387 Other specified dorsopathies, lumbosacral region: Secondary | ICD-10-CM | POA: Diagnosis not present

## 2017-01-02 DIAGNOSIS — M4607 Spinal enthesopathy, lumbosacral region: Secondary | ICD-10-CM | POA: Diagnosis not present

## 2017-01-02 DIAGNOSIS — M47817 Spondylosis without myelopathy or radiculopathy, lumbosacral region: Secondary | ICD-10-CM | POA: Diagnosis not present

## 2017-01-02 DIAGNOSIS — M9904 Segmental and somatic dysfunction of sacral region: Secondary | ICD-10-CM | POA: Diagnosis not present

## 2017-01-02 DIAGNOSIS — M5137 Other intervertebral disc degeneration, lumbosacral region: Secondary | ICD-10-CM | POA: Diagnosis not present

## 2017-01-03 DIAGNOSIS — M5137 Other intervertebral disc degeneration, lumbosacral region: Secondary | ICD-10-CM | POA: Diagnosis not present

## 2017-01-03 DIAGNOSIS — M4603 Spinal enthesopathy, cervicothoracic region: Secondary | ICD-10-CM | POA: Diagnosis not present

## 2017-01-03 DIAGNOSIS — M9904 Segmental and somatic dysfunction of sacral region: Secondary | ICD-10-CM | POA: Diagnosis not present

## 2017-01-03 DIAGNOSIS — M47817 Spondylosis without myelopathy or radiculopathy, lumbosacral region: Secondary | ICD-10-CM | POA: Diagnosis not present

## 2017-01-03 DIAGNOSIS — M5383 Other specified dorsopathies, cervicothoracic region: Secondary | ICD-10-CM | POA: Diagnosis not present

## 2017-01-03 DIAGNOSIS — M7601 Gluteal tendinitis, right hip: Secondary | ICD-10-CM | POA: Diagnosis not present

## 2017-01-08 ENCOUNTER — Encounter: Payer: Self-pay | Admitting: *Deleted

## 2017-01-08 ENCOUNTER — Emergency Department (INDEPENDENT_AMBULATORY_CARE_PROVIDER_SITE_OTHER)
Admission: EM | Admit: 2017-01-08 | Discharge: 2017-01-08 | Disposition: A | Discharge status: Home / Self Care | Payer: BLUE CROSS/BLUE SHIELD | Source: Home / Self Care | Attending: Family Medicine | Admitting: Family Medicine

## 2017-01-08 DIAGNOSIS — M9904 Segmental and somatic dysfunction of sacral region: Secondary | ICD-10-CM | POA: Diagnosis not present

## 2017-01-08 DIAGNOSIS — M47817 Spondylosis without myelopathy or radiculopathy, lumbosacral region: Secondary | ICD-10-CM | POA: Diagnosis not present

## 2017-01-08 DIAGNOSIS — M5137 Other intervertebral disc degeneration, lumbosacral region: Secondary | ICD-10-CM | POA: Diagnosis not present

## 2017-01-08 DIAGNOSIS — M7601 Gluteal tendinitis, right hip: Secondary | ICD-10-CM | POA: Diagnosis not present

## 2017-01-08 DIAGNOSIS — M4607 Spinal enthesopathy, lumbosacral region: Secondary | ICD-10-CM | POA: Diagnosis not present

## 2017-01-08 DIAGNOSIS — M5387 Other specified dorsopathies, lumbosacral region: Secondary | ICD-10-CM | POA: Diagnosis not present

## 2017-01-08 DIAGNOSIS — J029 Acute pharyngitis, unspecified: Secondary | ICD-10-CM | POA: Diagnosis not present

## 2017-01-08 LAB — POCT RAPID STREP A (OFFICE): Rapid Strep A Screen: NEGATIVE

## 2017-01-08 NOTE — ED Provider Notes (Signed)
[facility]    CSN: [id number] Arrival date & time: 01/08/17  1829     History   Chief Complaint Chief Complaint  Patient presents with  . Sore Throat    HPI Renee Norris is a 51 y.o. female.   Patient awoke with a sore throat today.  No cough or nasal congestion.  No fevers, chills, and sweats.   The history is provided by the patient.    Past Medical History:  Diagnosis Date  . Chronic back pain 01/30/2016   folowing with Ortho for Tramadol, we are managing benzo therapy, started TCA as well, refer to PT, [facility]   . Depression 01/30/2016   TCA to help this as well as pain/sleep issues, refer for CBT   . Insomnia 01/30/2016   TCA, do not take with benzos   . Postmenopausal 01/30/2016   HRT from OBGYN     Patient Active Problem List   Diagnosis Date Noted  . 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  . 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 Sinus bradycardia, chronic 04/04/2016  . H/O mammogram 02/14/2016  . History of bradycardia 02/14/2016  . Chronic back pain 01/30/2016  . Depression 01/30/2016  . Insomnia 01/30/2016  . Cephalalgia 01/30/2016  . Medication management 01/30/2016  . Other fatigue 01/30/2016  . Postmenopausal 01/30/2016    Past Surgical History:  Procedure Laterality Date  . ABDOMINAL SURGERY    . BREAST SURGERY    . GYN surgery    . TONSILLECTOMY      OB History    No data available       Home Medications    Prior to Admission medications   Medication Sig Start Date End Date Taking? Authorizing Provider  amitriptyline (ELAVIL) 50 MG tablet TAKE 1 TABLET 50 MG BY MOUTH AT BEDTIME. START 1/2 TABLET 25MG  BY MOUTH T BEDTIME FOR THE FIRST WEEK 04/02/16   Sunnie NielsenAlexander, Natalie, DO  aspirin 81 MG tablet Take 81 mg by mouth daily.    [provider]  BLACK PEPPER-TURMERIC PO Take by mouth.    [provider]  Cholecalciferol (VITAMIN D-3 PO) Take  by mouth.    [provider]  cyclobenzaprine (FLEXERIL) 5 MG tablet TAKE 1 TABLET BY MOUTH TWICE A DAY AS NEEDED FOR SPASM 05/09/16   [provider]  diazepam (VALIUM) 10 MG tablet Take 0.5-1 tablets (5-10 mg total) by mouth daily as needed for anxiety (Limit use to MAX 2-3 times per week to avoid tolerance/dependence). #30 for 90(ninety) days 11/20/16 02/18/17  Sunnie NielsenAlexander, Natalie, DO  DULoxetine (CYMBALTA) 20 MG capsule TAKE 1 CAPSULE (20 MG TOTAL) BY MOUTH DAILY. 05/07/16   Sunnie NielsenAlexander, Natalie, DO  estradiol (ESTRACE) 0.5 MG tablet Take 0.5 mg by mouth daily. 01/02/16   [provider]  ipratropium (ATROVENT) 0.03 % nasal spray PLACE 2 SPRAYS INTO BOTH NOSTRILS THREE TIMES A DAY AS NEEDED FOR RHINITIS 08/21/16   Sunnie NielsenAlexander, Natalie, DO  Menaquinone-7 (VITAMIN K2 PO) Take by mouth.    [provider]  Omega-3 Fatty Acids (OMEGA 3 PO) Take by mouth.    [provider]  ondansetron (ZOFRAN ODT) 8 MG disintegrating tablet Take 1 tablet (8 mg total) by mouth every 8 (eight) hours as needed for nausea or vomiting. 12/12/16   Sunnie NielsenAlexander, Natalie, DO  Pregnenolone POWD by Does not apply route.    [provider]  progesterone (PROMETRIUM) 100 MG capsule Take 200 mg  by mouth daily. 01/02/16   [provider]  topiramate (TOPAMAX) 25 MG tablet 1TAB NIGHTLY X14 DAYS THNE 2TABS NIGHTLY THEREAFTER 04/30/16   Sunnie Nielsen, DO  traMADol (ULTRAM) 50 MG tablet TAKE 1 TABLET BY MOUTH EVERY 12 HOURS 09/18/16   Sunnie Nielsen, DO    Family History Family History  Problem Relation Age of Onset  . Diabetes Mother   . Hypertension Mother   . Cancer Mother   . Diabetes Father   . Hypertension Father   . Cancer Father     Social History Social History  Substance Use Topics  . Smoking status: Former Games developer  . Smokeless tobacco: Never Used  . Alcohol use No     Comment: 2-3 glasses of wine a day     Allergies   Patient has no known  allergies.   Review of Systems Review of Systems + sore throat No cough No pleuritic pain No wheezing No nasal congestion No post-nasal drainage No sinus pain/pressure No itchy/red eyes No earache No hemoptysis No SOB No fever, + sweats No nausea No vomiting No abdominal pain No diarrhea No urinary symptoms No skin rash + fatigue No myalgias No headache    Physical Exam Triage Vital Signs ED Triage Vitals  Enc Vitals Group     BP 01/08/17 1852 107/71     Pulse Rate 01/08/17 1852 70     Resp 01/08/17 1852 16     Temp 01/08/17 1852 98.4 F (36.9 C)     Temp Source 01/08/17 1852 Oral     SpO2 01/08/17 1852 97 %     Weight 01/08/17 1853 169 lb (76.7 kg)     Height 01/08/17 1853 5' 4.5" (1.638 m)     Head Circumference --      Peak Flow --      Pain Score 01/08/17 1853 0     Pain Loc --      Pain Edu? --      Excl. in GC? --    No data found.   Updated Vital Signs BP 107/71 (BP Location: Left Arm)   Pulse 70   Temp 98.4 F (36.9 C) (Oral)   Resp 16   Ht 5' 4.5" (1.638 m)   Wt 169 lb (76.7 kg)   LMP 04/21/2014 (Approximate)   SpO2 97%   BMI 28.56 kg/m   Visual Acuity Right Eye Distance:   Left Eye Distance:   Bilateral Distance:    Right Eye Near:   Left Eye Near:    Bilateral Near:     Physical Exam Nursing notes and Vital Signs reviewed. Appearance:  Patient appears stated age, and in no acute distress Eyes:  Pupils are equal, round, and reactive to light and accomodation.  Extraocular movement is intact.  Conjunctivae are not inflamed  Ears:  Right canal occluded with cerumen.  Left canal partly occluded with cerumen; visible portion of left tympanic membrane appears normal. Nose:  Mildly congested turbinates.  No sinus tenderness.   Pharynx:  Normal Neck:  Supple.  Tender enlarged posterior/lateral nodes are palpated bilaterally.  Tonsillar nodes are not enlarged or tender.  Lungs:  Clear to auscultation.  Breath sounds are equal.  Moving  air well. Heart:  Regular rate and rhythm without murmurs, rubs, or gallops.  Abdomen:  Nontender without masses or hepatosplenomegaly.  Bowel sounds are present.  No CVA or flank tenderness.  Extremities:  No edema.  Skin:  No rash present.    UC  Treatments / Results  Labs (all labs ordered are listed, but only abnormal results are displayed) Labs Reviewed  STREP A DNA PROBE  POCT RAPID STREP A (OFFICE) negative    EKG  EKG Interpretation None       Radiology No results found.  Procedures Procedures (including critical care time)  Medications Ordered in UC Medications - No data to display   Initial Impression / Assessment and Plan / UC Course  I have reviewed the triage vital signs and the nursing notes.  Pertinent labs & imaging results that were available during my care of the patient were reviewed by me and considered in my medical decision making (see chart for details).    There is no evidence of bacterial infection today.  Suspect early viral URI. Throat culture pending. Continue warm salt water gargles for sore throat.  May take Ibuprofen 200mg , 4 tabs every 8 hours with food for sore throat, body aches, etc.  If cold symptoms develop, try the following: Take plain guaifenesin (1200mg  extended release tabs such as Mucinex) twice daily, with plenty of water, for cough and congestion.  May add Pseudoephedrine (30mg , one or two every 4 to 6 hours) for sinus congestion.  Get adequate rest.   May use Afrin nasal spray (or generic oxymetazoline) each morning for about 5 days and then discontinue.  Also recommend using saline nasal spray several times daily and saline nasal irrigation (AYR is a common brand).  May take Delsym Cough Suppressant at bedtime for nighttime cough.  Stop all antihistamines for now, and other non-prescription cough/cold preparations.  Follow-up with family doctor if not improving about 7 to 10 days.     Final Clinical Impressions(s) / UC  Diagnoses   Final diagnoses:  Pharyngitis, unspecified etiology    New Prescriptions New Prescriptions   No medications on file     Lattie Haw, MD 01/08/17 1916

## 2017-01-08 NOTE — ED Triage Notes (Signed)
Pt c/o sore throat x today. Denies fever or cough.  

## 2017-01-08 NOTE — Discharge Instructions (Signed)
Continue warm salt water gargles for sore throat.  May take Ibuprofen 200mg , 4 tabs every 8 hours with food for sore throat, body aches, etc.  If cold symptoms develop, try the following: Take plain guaifenesin (1200mg  extended release tabs such as Mucinex) twice daily, with plenty of water, for cough and congestion.  May add Pseudoephedrine (30mg , one or two every 4 to 6 hours) for sinus congestion.  Get adequate rest.   May use Afrin nasal spray (or generic oxymetazoline) each morning for about 5 days and then discontinue.  Also recommend using saline nasal spray several times daily and saline nasal irrigation (AYR is a common brand).  May take Delsym Cough Suppressant at bedtime for nighttime cough.  Stop all antihistamines for now, and other non-prescription cough/cold preparations.  Follow-up with family doctor if not improving about 7 to 10 days.

## 2017-01-09 ENCOUNTER — Encounter: Payer: Self-pay | Admitting: Osteopathic Medicine

## 2017-01-09 ENCOUNTER — Ambulatory Visit (INDEPENDENT_AMBULATORY_CARE_PROVIDER_SITE_OTHER): Payer: BLUE CROSS/BLUE SHIELD | Admitting: Osteopathic Medicine

## 2017-01-09 VITALS — BP 111/75 | HR 78 | Ht 64.5 in | Wt 170.0 lb

## 2017-01-09 DIAGNOSIS — J029 Acute pharyngitis, unspecified: Secondary | ICD-10-CM | POA: Diagnosis not present

## 2017-01-09 DIAGNOSIS — H6123 Impacted cerumen, bilateral: Secondary | ICD-10-CM | POA: Diagnosis not present

## 2017-01-09 DIAGNOSIS — J069 Acute upper respiratory infection, unspecified: Secondary | ICD-10-CM

## 2017-01-09 MED ORDER — LIDOCAINE VISCOUS HCL 2 % MT SOLN: 10.0000 mL | OROMUCOSAL | 1 refills | Status: DC | PRN

## 2017-01-09 MED ORDER — IPRATROPIUM BROMIDE 0.06 % NA SOLN: 2.0000 | Freq: Four times a day (QID) | NASAL | 12 refills | Status: DC

## 2017-01-09 NOTE — Patient Instructions (Signed)
Over-the-Counter Medications & Home Remedies for Upper Respiratory Illness  Note: the following list assumes no pregnancy, normal liver & kidney function and no other drug interactions. Dr. Lyn HollingsheadAlexander has highlighted medications which are safe for you to use, but these may not be appropriate for everyone. Always ask a pharmacist or qualified medical provider if you have any questions!   Aches/Pains, Fever, Headache Acetaminophen (Tylenol) 500 mg tablets - take max 2 tablets (1000 mg) every 6 hours (4 times per day)  Ibuprofen (Motrin) 200 mg tablets - take max 4 tablets (800 mg) every 6 hours*  Sinus Congestion Prescription Atrovent as directed Nasal Saline if desired Phenylephrine (Sudafed) 10 mg tablets every 4 hours (or the 12-hour formulation)* Diphenhydramine (Benadryl) 25 mg tablets - take max 2 tablets every 4 hours  Cough & Sore Throat Dextromethorphan (Robitussin, others) - cough suppressant Guaifenesin (Robitussin, Mucinex, others) - expectorant (helps cough up mucus) (Dextromethorphan and Guaifenesin also come in a combination tablet) Lozenges w/ Benzocaine + Menthol (Cepacol) Honey - as much as you want! Teas which "coat the throat" - look for ingredients Elm Bark, Licorice Root, Marshmallow Root  Other Zinc Lozenges within 24 hours of symptoms onset - mixed evidence this shortens the duration of the common cold Don't waste your money on Vitamin C or Echinacea  *Caution in patients with high blood pressure or kidney disease

## 2017-01-09 NOTE — Progress Notes (Signed)
HPI: Renee Norris is a 51 y.o. female who [facility] for chief complaint of:  Chief Complaint  Patient presents with  . Sore Throat    Acute Illness: . Context: recently seen by UC for viral pharyngitis, seen yesterday, not better... . Location: sinuses and throat . Quality: soreness, congestion . Assoc signs/symptoms: see ROS . Duration: 2 days . Modifying factors: has tried the following OTC/Rx medications: ibuprofen helped a bit, last took this >6h ago   Past medical, social and family history reviewed.  Patient Active Problem List   Diagnosis Date Noted  . 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  . 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 Sinus bradycardia, chronic 04/04/2016  . H/O mammogram 02/14/2016  . History of bradycardia 02/14/2016  . Chronic back pain 01/30/2016  . Depression 01/30/2016  . Insomnia 01/30/2016  . Cephalalgia 01/30/2016  . Medication management 01/30/2016  . Other fatigue 01/30/2016  . Postmenopausal 01/30/2016   Current medications and allergies reviewed.     Review of Systems:  Constitutional: No  fever/chills  HEENT: sinus headache, Yes  sore throat, No  swollen glands, (+)ear congestion/  Cardiovascular: No chest pain  Respiratory:mild cough, No  shortness of breath  Gastrointestinal: No  nausea, No  vomiting,  No  diarrhea  Musculoskeletal:   No  myalgia/arthralgia  Skin/Integument:  No  rash   Detailed Exam:  BP 111/75   Pulse 78   Ht 5' 4.5" (1.638 m)   Wt 170 lb (77.1 kg)   LMP 04/21/2014 (Approximate)   BMI 28.73 kg/m   Constitutional:   VSS, see above.   General Appearance: alert, well-developed, well-nourished, NAD  Eyes:   Normal lids and conjunctive, non-icteric sclera  Ears, Nose, Mouth, Throat:   Normal external inspection ears/nares  Normal mouth/lips/gums, MMM  unable to visualise R TM but  normal L TM  posterior pharynx with erythema, without exudate  nasal mucosa normal  Skin:  Normal inspection, no rash or concerning lesions noted on limited exam  Neck:   No masses, trachea midline. normal lymph nodes  Respiratory:   Normal respiratory effort.   No  wheeze/rhonchi/rales  Cardiovascular:   S1/S2 normal, no murmur/rub/gallop auscultated. RRR.   Results for orders placed or performed during the hospital encounter of 01/08/17 (from the past 72 hour(s))  POCT rapid strep A     Status: None   Collection Time: 01/08/17  6:55 PM  Result Value Ref Range   Rapid Strep A Screen Negative Negative   No results found.   ASSESSMENT/PLAN:  Viral pharyngitis - Plan: ipratropium (ATROVENT) 0.06 % nasal spray, Lidocaine HCl 2 % SOLN  Viral upper respiratory illness  Bilateral impacted cerumen   Indication: Cerumen impaction of the ear(s) Medical necessity statement: On physical examination, cerumen impairs clinically significant portions of the external auditory canal, and tympanic membrane. Noted obstructive, copious cerumen that cannot be removed without magnification and instrumentations requiring physician skills Consent: Discussed benefits and risks of procedure and verbal consent obtained Procedure: Patient was prepped for the procedure. Utilized an otoscope to assess and take note of the ear canal, the tympanic membrane, and the presence, amount, and placement of the cerumen. Gentle water irrigation and soft plastic curette was utilized to remove cerumen. Still some cerumen present but patient asked to halt procedure d/t discomfort and will come back tomorrow  Post procedure examination: shows cerumen was completely removed. Patient tolerated procedure well. The  patient is made aware that they may experience temporary vertigo, temporary hearing loss, and temporary discomfort. If these symptom last for more than 24 hours to call the clinic or proceed to the ED.     Patient Instructions  Over-the-Counter Medications & Home Remedies for Upper Respiratory Illness  Note: the following list assumes no pregnancy, normal liver & kidney function and no other drug interactions. Dr. Lyn Hollingshead has highlighted medications which are safe for you to use, but these may not be appropriate for everyone. Always ask a pharmacist or qualified medical provider if you have any questions!   Aches/Pains, Fever, Headache Acetaminophen (Tylenol) 500 mg tablets - take max 2 tablets (1000 mg) every 6 hours (4 times per day)  Ibuprofen (Motrin) 200 mg tablets - take max 4 tablets (800 mg) every 6 hours*  Sinus Congestion Prescription Atrovent as directed Nasal Saline if desired Phenylephrine (Sudafed) 10 mg tablets every 4 hours (or the 12-hour formulation)* Diphenhydramine (Benadryl) 25 mg tablets - take max 2 tablets every 4 hours  Cough & Sore Throat Dextromethorphan (Robitussin, others) - cough suppressant Guaifenesin (Robitussin, Mucinex, others) - expectorant (helps cough up mucus) (Dextromethorphan and Guaifenesin also come in a combination tablet) Lozenges w/ Benzocaine + Menthol (Cepacol) Honey - as much as you want! Teas which "coat the throat" - look for ingredients Elm Bark, Licorice Root, Marshmallow Root  Other Zinc Lozenges within 24 hours of symptoms onset - mixed evidence this shortens the duration of the common cold Don't waste your money on Vitamin C or Echinacea  *Caution in patients with high blood pressure or kidney disease        Visit summary was printed for the patient with medications and pertinent instructions for patient to review. ER/RTC precautions reviewed. All questions answered. Return if symptoms worsen or fail to improve. And tomorrow to repeat cerumen removal attempt

## 2017-01-10 ENCOUNTER — Encounter: Payer: Self-pay | Admitting: Osteopathic Medicine

## 2017-01-10 ENCOUNTER — Ambulatory Visit (INDEPENDENT_AMBULATORY_CARE_PROVIDER_SITE_OTHER): Payer: BLUE CROSS/BLUE SHIELD | Admitting: Osteopathic Medicine

## 2017-01-10 VITALS — BP 116/81 | HR 63 | Ht 64.0 in | Wt 170.0 lb

## 2017-01-10 DIAGNOSIS — H6121 Impacted cerumen, right ear: Secondary | ICD-10-CM | POA: Diagnosis not present

## 2017-01-10 LAB — STREP A DNA PROBE

## 2017-01-10 NOTE — Progress Notes (Signed)
HPI: Renee Norris is a 51 y.o. female  who [facility] [place] today, 01/10/17,  for chief complaint of:  Chief Complaint  Patient presents with  . Ear Pain    RIGHT    Seen yesterday for viral upper respiratory infection, cerumen impaction removal at that time only partially successful, patient unable to tolerate full curettage so went home with instructions to soften with mineral oil and return to clinic today  Past medical history, surgical history, social history and family history reviewed.    Current medication list and allergy/intolerance information reviewed.      Review of Systems:  Constitutional: +recent illness  HEENT: No  headache, no vision change, ear problem as per history of present illness  Exam:  BP 116/81   Pulse 63   Ht 5\' 4"  (1.626 m)   Wt 170 lb (77.1 kg)   LMP 04/21/2014 (Approximate)   BMI 29.18 kg/m   Constitutional: VS see above. General Appearance: alert, well-developed, well-nourished, NAD  Ears, Nose, Mouth, Throat: Cerumen impaction still present, see below for procedure note. Once tympanic membranes visible on left, appears normal  Neck: No masses, trachea midline.  Respiratory: Normal respiratory effort.    ASSESSMENT/PLAN:   Impacted cerumen of right ear   Indication: Cerumen impaction of the ear(s) Medical necessity statement: On physical examination, cerumen impairs clinically significant portions of the external auditory canal, and tympanic membrane. Noted obstructive, copious cerumen that cannot be removed without magnification and instrumentations requiring physician skills Consent: Discussed benefits and risks of procedure and verbal consent obtained Procedure: Patient was prepped for the procedure. Utilized an otoscope to assess and take note of the ear canal, the tympanic membrane, and the presence, amount, and placement of the cerumen. Gentle water irrigation and soft plastic curette  was utilized to remove cerumen completely Post procedure examination: shows cerumen was completely removed. Patient tolerated procedure well. The patient is made aware that they may experience temporary vertigo, temporary hearing loss, and temporary discomfort. If these symptom last for more than 24 hours to call the clinic or proceed to the ED.     Follow-up plan: Return if symptoms worsen or fail to improve.  All questions at time of visit were answered - patient instructed to contact office with any additional concerns. ER/RTC precautions were reviewed with the patient and understanding verbalized.   Note: Total time spent 15 minutes, greater than 50% of the visit was spent face-to-face counseling and coordinating care for the following: The encounter diagnosis was Impacted cerumen of right ear..  Established: 24268: 99213- 15 / 99214- 25 / 99215- 40 New: [id number]- 30 / 99204- 45 / 99205- 60

## 2017-01-11 ENCOUNTER — Telehealth: Payer: Self-pay | Admitting: *Deleted

## 2017-01-11 LAB — THROAT CULTURE

## 2017-01-11 MED ORDER — PENICILLIN V POTASSIUM 500 MG PO TABS: 500.0000 mg | ORAL_TABLET | Freq: Two times a day (BID) | ORAL | 0 refills | Status: AC

## 2017-01-11 NOTE — Telephone Encounter (Signed)
Callback: Patient advised of TCX resulting in Strep C. Per Dr. Cathren HarshBeese e-scribed Penicillin VK 500mg  BID x 7 days. Patient asked for work note. Note left @ front office.

## 2017-01-15 DIAGNOSIS — M9904 Segmental and somatic dysfunction of sacral region: Secondary | ICD-10-CM | POA: Diagnosis not present

## 2017-01-15 DIAGNOSIS — M5137 Other intervertebral disc degeneration, lumbosacral region: Secondary | ICD-10-CM | POA: Diagnosis not present

## 2017-01-15 DIAGNOSIS — M7601 Gluteal tendinitis, right hip: Secondary | ICD-10-CM | POA: Diagnosis not present

## 2017-01-15 DIAGNOSIS — M5387 Other specified dorsopathies, lumbosacral region: Secondary | ICD-10-CM | POA: Diagnosis not present

## 2017-01-15 DIAGNOSIS — M4607 Spinal enthesopathy, lumbosacral region: Secondary | ICD-10-CM | POA: Diagnosis not present

## 2017-01-15 DIAGNOSIS — M47817 Spondylosis without myelopathy or radiculopathy, lumbosacral region: Secondary | ICD-10-CM | POA: Diagnosis not present

## 2017-01-16 DIAGNOSIS — M47817 Spondylosis without myelopathy or radiculopathy, lumbosacral region: Secondary | ICD-10-CM | POA: Diagnosis not present

## 2017-01-16 DIAGNOSIS — M9904 Segmental and somatic dysfunction of sacral region: Secondary | ICD-10-CM | POA: Diagnosis not present

## 2017-01-16 DIAGNOSIS — M7601 Gluteal tendinitis, right hip: Secondary | ICD-10-CM | POA: Diagnosis not present

## 2017-01-16 DIAGNOSIS — M5137 Other intervertebral disc degeneration, lumbosacral region: Secondary | ICD-10-CM | POA: Diagnosis not present

## 2017-01-17 DIAGNOSIS — M7601 Gluteal tendinitis, right hip: Secondary | ICD-10-CM | POA: Diagnosis not present

## 2017-01-17 DIAGNOSIS — M9904 Segmental and somatic dysfunction of sacral region: Secondary | ICD-10-CM | POA: Diagnosis not present

## 2017-01-17 DIAGNOSIS — M5137 Other intervertebral disc degeneration, lumbosacral region: Secondary | ICD-10-CM | POA: Diagnosis not present

## 2017-01-17 DIAGNOSIS — M47817 Spondylosis without myelopathy or radiculopathy, lumbosacral region: Secondary | ICD-10-CM | POA: Diagnosis not present

## 2017-01-21 ENCOUNTER — Other Ambulatory Visit: Payer: Self-pay | Admitting: Osteopathic Medicine

## 2017-01-22 DIAGNOSIS — M9904 Segmental and somatic dysfunction of sacral region: Secondary | ICD-10-CM | POA: Diagnosis not present

## 2017-01-22 DIAGNOSIS — M7601 Gluteal tendinitis, right hip: Secondary | ICD-10-CM | POA: Diagnosis not present

## 2017-01-22 DIAGNOSIS — M5137 Other intervertebral disc degeneration, lumbosacral region: Secondary | ICD-10-CM | POA: Diagnosis not present

## 2017-01-22 DIAGNOSIS — M5383 Other specified dorsopathies, cervicothoracic region: Secondary | ICD-10-CM | POA: Diagnosis not present

## 2017-01-22 DIAGNOSIS — M4603 Spinal enthesopathy, cervicothoracic region: Secondary | ICD-10-CM | POA: Diagnosis not present

## 2017-01-22 DIAGNOSIS — M47817 Spondylosis without myelopathy or radiculopathy, lumbosacral region: Secondary | ICD-10-CM | POA: Diagnosis not present

## 2017-01-23 DIAGNOSIS — M4603 Spinal enthesopathy, cervicothoracic region: Secondary | ICD-10-CM | POA: Diagnosis not present

## 2017-01-23 DIAGNOSIS — M5383 Other specified dorsopathies, cervicothoracic region: Secondary | ICD-10-CM | POA: Diagnosis not present

## 2017-01-24 DIAGNOSIS — M4607 Spinal enthesopathy, lumbosacral region: Secondary | ICD-10-CM | POA: Diagnosis not present

## 2017-01-24 DIAGNOSIS — M9904 Segmental and somatic dysfunction of sacral region: Secondary | ICD-10-CM | POA: Diagnosis not present

## 2017-01-24 DIAGNOSIS — M47817 Spondylosis without myelopathy or radiculopathy, lumbosacral region: Secondary | ICD-10-CM | POA: Diagnosis not present

## 2017-01-24 DIAGNOSIS — M5137 Other intervertebral disc degeneration, lumbosacral region: Secondary | ICD-10-CM | POA: Diagnosis not present

## 2017-01-24 DIAGNOSIS — M7601 Gluteal tendinitis, right hip: Secondary | ICD-10-CM | POA: Diagnosis not present

## 2017-01-24 DIAGNOSIS — M5387 Other specified dorsopathies, lumbosacral region: Secondary | ICD-10-CM | POA: Diagnosis not present

## 2017-01-29 DIAGNOSIS — M5383 Other specified dorsopathies, cervicothoracic region: Secondary | ICD-10-CM | POA: Diagnosis not present

## 2017-01-29 DIAGNOSIS — M4603 Spinal enthesopathy, cervicothoracic region: Secondary | ICD-10-CM | POA: Diagnosis not present

## 2017-02-18 DIAGNOSIS — F330 Major depressive disorder, recurrent, mild: Secondary | ICD-10-CM | POA: Diagnosis not present

## 2017-02-18 DIAGNOSIS — F4010 Social phobia, unspecified: Secondary | ICD-10-CM | POA: Diagnosis not present

## 2017-02-21 DIAGNOSIS — R5382 Chronic fatigue, unspecified: Secondary | ICD-10-CM | POA: Diagnosis not present

## 2017-02-21 DIAGNOSIS — E2740 Unspecified adrenocortical insufficiency: Secondary | ICD-10-CM | POA: Diagnosis not present

## 2017-02-21 DIAGNOSIS — E039 Hypothyroidism, unspecified: Secondary | ICD-10-CM | POA: Diagnosis not present

## 2017-02-21 DIAGNOSIS — E559 Vitamin D deficiency, unspecified: Secondary | ICD-10-CM | POA: Diagnosis not present

## 2017-02-21 DIAGNOSIS — E063 Autoimmune thyroiditis: Secondary | ICD-10-CM | POA: Diagnosis not present

## 2017-03-01 DIAGNOSIS — F411 Generalized anxiety disorder: Secondary | ICD-10-CM | POA: Diagnosis not present

## 2017-03-01 DIAGNOSIS — M545 Low back pain: Secondary | ICD-10-CM | POA: Diagnosis not present

## 2017-03-01 DIAGNOSIS — F330 Major depressive disorder, recurrent, mild: Secondary | ICD-10-CM | POA: Diagnosis not present

## 2017-03-06 DIAGNOSIS — F330 Major depressive disorder, recurrent, mild: Secondary | ICD-10-CM | POA: Diagnosis not present

## 2017-03-06 DIAGNOSIS — F4010 Social phobia, unspecified: Secondary | ICD-10-CM | POA: Diagnosis not present

## 2017-03-06 DIAGNOSIS — F411 Generalized anxiety disorder: Secondary | ICD-10-CM | POA: Diagnosis not present

## 2017-03-08 DIAGNOSIS — M545 Low back pain: Secondary | ICD-10-CM | POA: Diagnosis not present

## 2017-03-12 ENCOUNTER — Other Ambulatory Visit: Payer: Self-pay | Admitting: Osteopathic Medicine

## 2017-03-13 ENCOUNTER — Telehealth: Payer: Self-pay | Admitting: *Deleted

## 2017-03-13 NOTE — Telephone Encounter (Signed)
Received refill request for diazepam for patient and she also called and left a message asking us to refill this medication. This medication was filled once last year and the direction say it was for back pain/muscle spasms. The patient will need a f/u appointment. Called and advised and patient was transferred for scheduling

## 2017-03-14 ENCOUNTER — Encounter: Payer: Self-pay | Admitting: Osteopathic Medicine

## 2017-03-14 ENCOUNTER — Ambulatory Visit (INDEPENDENT_AMBULATORY_CARE_PROVIDER_SITE_OTHER): Payer: BLUE CROSS/BLUE SHIELD | Admitting: Osteopathic Medicine

## 2017-03-14 VITALS — BP 105/67 | HR 81 | Ht 64.0 in | Wt 173.0 lb

## 2017-03-14 DIAGNOSIS — Z760 Encounter for issue of repeat prescription: Secondary | ICD-10-CM

## 2017-03-14 DIAGNOSIS — Z0000 Encounter for general adult medical examination without abnormal findings: Secondary | ICD-10-CM

## 2017-03-14 MED ORDER — DIAZEPAM 10 MG PO TABS: 5.0000 mg | ORAL_TABLET | Freq: Every day | ORAL | 1 refills | Status: DC | PRN

## 2017-03-14 NOTE — Progress Notes (Signed)
HPI: Renee Norris is a 51 y.o. female  who [facility] [place] today, 03/14/17,  for chief complaint of: Refill medications, no other problems today   Valium sparing use for severe back spasms. This really should have been something that was routed to me for approval rather than ask the patient to come in - last filled 05/2016 based on [place] [facility] database.    Past medical history, surgical history, social history and family history reviewed.  Patient Active Problem List   Diagnosis Date Noted  . 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  . 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 Sinus bradycardia, chronic 04/04/2016  . H/O mammogram 02/14/2016  . History of bradycardia 02/14/2016  . Chronic back pain 01/30/2016  . Depression 01/30/2016  . Insomnia 01/30/2016  . Cephalalgia 01/30/2016  . Medication management 01/30/2016  . Other fatigue 01/30/2016  . Postmenopausal 01/30/2016    Current medication list and allergy/intolerance information reviewed.   Current Outpatient Prescriptions on File Prior to Visit  Medication Sig Dispense Refill  . BLACK PEPPER-TURMERIC PO Take by mouth.    . estradiol (ESTRACE) 0.5 MG tablet Take 0.5 mg by mouth daily.  0  . Omega-3 Fatty Acids (OMEGA 3 PO) Take by mouth.    . progesterone (PROMETRIUM) 100 MG capsule Take 200 mg by mouth daily.  0  . traMADol (ULTRAM) 50 MG tablet TAKE 1 TABLET BY MOUTH EVERY 12 HOURS AS NEEDED FOR PAIN 50 tablet 2   No current facility-administered medications on file prior to visit.    No Known Allergies    Review of Systems:  Constitutional: No recent illness  Neurologic: No  weakness, No  Dizziness  Psychiatric: No  concerns with depression, No  concerns with anxiety  Exam:  BP 105/67   Pulse 81   Ht 5\' 4"  (1.626 m)   Wt 173 lb (78.5 kg)   LMP 04/21/2014 (Approximate)   BMI 29.70 kg/m   Constitutional: VS see above.  General Appearance: alert, well-developed, well-nourished, NAD  Respiratory: Normal respiratory effort. no wheeze, no rhonchi, no rales  Cardiovascular: S1/S2 normal, no murmur, no rub/gallop auscultated. RRR.   Psychiatric: Normal judgment/insight. Normal mood and affect. Oriented x3.      ASSESSMENT/PLAN: OK to refill diazepam and Tramadol until next 6 month follow-up.   Encounter for medication refill - Plan: diazepam (VALIUM) 10 MG tablet  Annual physical exam - Plan: CBC, COMPLETE METABOLIC PANEL WITH GFR, Lipid panel, TSH, VITAMIN D 25 Hydroxy (Vit-D Deficiency, Fractures)     Follow-up plan: Return in about 6 months (around 09/14/2017) for annual physical, sooner if needed.   Annual physical was not performed or billed today - labs ordered for future visit   Visit summary with medication list and pertinent instructions was printed for patient to review, alert us if any changes needed. All questions at time of visit were answered - patient instructed to contact office with any additional concerns.   Note: Total time spent 10 minutes, greater than 50% of the visit was spent face-to-face counseling and coordinating care for the following: The primary encounter diagnosis was Encounter for medication refill. A diagnosis of Annual physical exam was also pertinent to this visit..Marland Kitchen

## 2017-03-21 DIAGNOSIS — F411 Generalized anxiety disorder: Secondary | ICD-10-CM | POA: Diagnosis not present

## 2017-03-21 DIAGNOSIS — F331 Major depressive disorder, recurrent, moderate: Secondary | ICD-10-CM | POA: Diagnosis not present

## 2017-03-27 DIAGNOSIS — M62838 Other muscle spasm: Secondary | ICD-10-CM | POA: Diagnosis not present

## 2017-03-27 DIAGNOSIS — M2550 Pain in unspecified joint: Secondary | ICD-10-CM | POA: Diagnosis not present

## 2017-03-29 DIAGNOSIS — F411 Generalized anxiety disorder: Secondary | ICD-10-CM | POA: Diagnosis not present

## 2017-03-29 DIAGNOSIS — F331 Major depressive disorder, recurrent, moderate: Secondary | ICD-10-CM | POA: Diagnosis not present

## 2017-04-04 DIAGNOSIS — F331 Major depressive disorder, recurrent, moderate: Secondary | ICD-10-CM | POA: Diagnosis not present

## 2017-04-04 DIAGNOSIS — F411 Generalized anxiety disorder: Secondary | ICD-10-CM | POA: Diagnosis not present

## 2017-04-11 DIAGNOSIS — L905 Scar conditions and fibrosis of skin: Secondary | ICD-10-CM | POA: Diagnosis not present

## 2017-04-11 DIAGNOSIS — L570 Actinic keratosis: Secondary | ICD-10-CM | POA: Diagnosis not present

## 2017-04-12 ENCOUNTER — Encounter: Payer: Self-pay | Admitting: Osteopathic Medicine

## 2017-04-19 DIAGNOSIS — F331 Major depressive disorder, recurrent, moderate: Secondary | ICD-10-CM | POA: Diagnosis not present

## 2017-04-19 DIAGNOSIS — F411 Generalized anxiety disorder: Secondary | ICD-10-CM | POA: Diagnosis not present

## 2017-04-29 DIAGNOSIS — M5136 Other intervertebral disc degeneration, lumbar region: Secondary | ICD-10-CM | POA: Diagnosis not present

## 2017-04-29 DIAGNOSIS — M545 Low back pain: Secondary | ICD-10-CM | POA: Diagnosis not present

## 2017-05-01 ENCOUNTER — Other Ambulatory Visit: Payer: Self-pay | Admitting: Osteopathic Medicine

## 2017-05-08 ENCOUNTER — Other Ambulatory Visit: Payer: Self-pay | Admitting: Osteopathic Medicine

## 2017-05-19 ENCOUNTER — Encounter: Payer: Self-pay | Admitting: Osteopathic Medicine

## 2017-05-22 ENCOUNTER — Ambulatory Visit (INDEPENDENT_AMBULATORY_CARE_PROVIDER_SITE_OTHER): Payer: BLUE CROSS/BLUE SHIELD | Admitting: Family Medicine

## 2017-05-22 ENCOUNTER — Encounter: Payer: Self-pay | Admitting: Family Medicine

## 2017-05-22 VITALS — BP 117/66 | HR 54 | Wt 167.0 lb

## 2017-05-22 DIAGNOSIS — Z23 Encounter for immunization: Secondary | ICD-10-CM | POA: Diagnosis not present

## 2017-05-22 DIAGNOSIS — Z1231 Encounter for screening mammogram for malignant neoplasm of breast: Secondary | ICD-10-CM

## 2017-05-22 DIAGNOSIS — M47816 Spondylosis without myelopathy or radiculopathy, lumbar region: Secondary | ICD-10-CM | POA: Diagnosis not present

## 2017-05-22 DIAGNOSIS — Z780 Asymptomatic menopausal state: Secondary | ICD-10-CM | POA: Diagnosis not present

## 2017-05-22 DIAGNOSIS — Z1239 Encounter for other screening for malignant neoplasm of breast: Secondary | ICD-10-CM

## 2017-05-22 NOTE — Progress Notes (Signed)
Subjective:    I'm seeing this patient as a consultation for:  Renee Nielsen, DO   CC: Chronic Back Pain  HPI: Renee Norris has a long history of chronic axial lumbosacral pain. She's had an extensive workup previously and subsequently had facet injections and Right L3-4, L4-5, and L5-S1 Medial Branch RF ablation she had excellent pain relief following the medial branch blocks but no significant pain relief with the ablation. Since then she's had continued chronic daily pain. This is managed with infrequent low-dose tramadol as well as home exercise program. She's had extensive physical therapy which has helped. Additionally she is currently receiving cognitive behavioral therapy which is helping. The pain is obnoxious chronic and daily and interferes with her ability to exercise normally. She is interested in surgery in [place] but is anxious about the safety of this.    Past medical history, Surgical history, Family history not pertinant except as noted below, Social history, Allergies, and medications have been entered into the medical record, reviewed, and no changes needed.   Review of Systems: No headache, visual changes, nausea, vomiting, diarrhea, constipation, dizziness, abdominal pain, skin rash, fevers, chills, night sweats, weight loss, swollen lymph nodes, body aches, joint swelling, muscle aches, chest pain, shortness of breath, mood changes, visual or auditory hallucinations.   Objective:    Vitals:   05/22/17 0847  BP: 117/66  Pulse: (!) 54   General: Well Developed, well nourished, and in no acute distress.  Neuro/Psych: Alert and oriented x3, extra-ocular muscles intact, able to move all 4 extremities, sensation grossly intact. Skin: Warm and dry, no rashes noted.  Respiratory: Not using accessory muscles, speaking in full sentences, trachea midline.  Cardiovascular: Pulses palpable, no extremity edema. Abdomen: Does not appear distended. MSK:  L spine: Nontender to  spinal midline decreased extension range of motion normal flexion rotation and lateral flexion. Lower extremity strength reflexes and sensation are equal and normal throughout. Normal gait.   Study Result   CLINICAL DATA:  Low back pain right hip pain and weakness. Symptoms for many years.  EXAM: MRI LUMBAR SPINE WITHOUT CONTRAST  TECHNIQUE: Multiplanar, multisequence MR imaging of the lumbar spine was performed. No intravenous contrast was administered.  COMPARISON:  01/15/2016  FINDINGS: Segmentation:  5 lumbar type vertebral bodies.  Alignment: Mild straightening of the normal lumbar lordosis. 1 mm anterolisthesis at L3-4 in this position.  Vertebrae:  No fracture or primary bone lesion.  Conus medullaris: Extends to the L1 level and appears normal.  Paraspinal and other soft tissues: Negative  Disc levels:  T12-L1 and L1-2:  Normal.  L2-3: Mild bulging of the disc. Mild facet and ligamentous hypertrophy. No stenosis. Mild facet edema could be associated with pain.  L3-4: Advanced bilateral facet arthropathy with gaping, fluid-filled facet joints. Facet edema. 1 mm of anterolisthesis. Mild bulging of the disc. Mild narrowing of the canal but no compressive stenosis. Findings could certainly be associated with back pain or referred facet syndrome pain. Anterolisthesis would likely worsen with standing or flexion.  L4-5: Bulging of the disc with shallow protrusion in the left posterolateral to foraminal region. Facet and ligamentous hypertrophy. Narrowing of the lateral recesses worse on the left. Neural compression could occur, particularly in the left lateral recess. Facet osteoarthritis could contribute to pain.  L5-S1: Small left posterolateral disc herniation contacting the left S1 nerve root. Nerve compression is not demonstrated, but this could cause left S1 nerve irritation. Mild facet osteoarthritis.  Since the previous study, findings  are quite similar with the exception of the newly seen shallow left-sided disc protrusion at L4-5.  IMPRESSION: L2-3: Disc bulge. Facet osteoarthritis with mild edema. Facet osteoarthritis could be symptomatic.  L3-4: Advanced bilateral facet osteoarthritis with edema in joint effusions. 1 mm anterolisthesis. Bulging of the disc. No compressive stenosis. Anterolisthesis could worsen with standing or flexion. The facet arthropathy would quite likely be painful.  L4-5: Disc bulge with newly seen shallow left posterolateral to foraminal disc protrusion. Facet and ligamentous hypertrophy. Slightly worsened stenosis of the left lateral recess and proximal foramen on the left that could be symptomatic. Facet arthropathy could also be symptomatic.  Chronic small left posterolateral disc herniation contacting the left S1 nerve root could cause irritation of that structure.   Electronically Signed   By: Paulina Fusi M.D.   On: 11/02/2016 12:01    No results found for this or any previous visit (from the past 24 hour(s)). No results found.  Impression and Recommendations:    Assessment and Plan: 51 y.o. 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 We had a lengthy discussion about potential treatment options. I expressed the evidence that she is unlikely to have complete resolution of pain with any treatment at this time. I do think it's reasonable to repeat a trial of facet ablations. She had excellent temporary control of pain with medial branch block but not much control of pain with ablation. I wonder if they simply missed the ablation. I think it's worthwhile trying this again. Additionally I do think a trial of dry needling with physical therapy will be helpful as well as any complementary alternative treatments that she like to pursue such as massage therapy acupuncture etc.  I recommend that she return to clinic for recheck and reevaluation in about 6  weeks  Additionally as part of health maintenance influenza vaccine was provided today. Patient had a DEXA scan and mammogram ordered as well.   Orders Placed This Encounter  Procedures  . DG Bone Density    Standing Status:   Future    Standing Expiration Date:   07/23/2018    Order Specific Question:   Reason for exam:    Answer:   bone density    Order Specific Question:   Preferred imaging location?    Answer:   [facility]  . MM SCREENING BREAST TOMO BILATERAL    Standing Status:   Future    Standing Expiration Date:   07/22/2018    Order Specific Question:   Reason for Exam (SYMPTOM  OR DIAGNOSIS REQUIRED)    Answer:   screen breast cancer    Order Specific Question:   Is the patient pregnant?    Answer:   No    Order Specific Question:   Preferred imaging location?    Answer:   [facility]  . DG Facet Jt Neuro Destruct Sing L/S w/Img Guide    Order Specific Question:   Reason for exam:    Answer:   L3-4, L4-5, and L5-S1    Order Specific Question:   Is the patient pregnant?    Answer:   No    Order Specific Question:   Preferred imaging location?    Answer:   [facility]  . DG Facet Jt Neuro Destruct Sing L/S w/Img Guide    Order Specific Question:   Reason for exam:    Answer:   Need confirmatory medial branch block, and if responsive, radiofrequency ablation of: L3-4, L4-5,  and L5-S1 Medial Branch RF ablatio    Order Specific Question:   Is the patient pregnant?    Answer:   No    Order Specific Question:   Preferred imaging location?    Answer:   [facility] [address]  . DG Facet Jt Neuro Destruct Sing L/S w/Img Guide    Order Specific Question:   Reason for exam:    Answer:   Need confirmatory medial branch block, and if responsive, radiofrequency ablation of: L3-4, L4-5, and L5-S1 Medial Branch RF ablatio    Order Specific Question:   Is the patient pregnant?    Answer:   No    Order Specific Question:   Preferred imaging location?     Answer:   [facility]  . Flu Vaccine QUAD 36+ mos IM  . Ambulatory referral to Physical Therapy    Referral Priority:   Routine    Referral Type:   Physical Medicine    Referral Reason:   Specialty Services Required    Requested Specialty:   Physical Therapy     Discussed warning signs or symptoms. Please see discharge instructions. Patient expresses understanding.

## 2017-05-22 NOTE — Patient Instructions (Signed)
Thank you for coming in today. You should hear from [facility] about the medial branch blocks and RFA planned for your lumbar spine.  Let me know if you do not hear anything.  Continue core strength exercises.   You should hear from PT about Dry Needling  Continue CBT.   Recheck with me in 6 weeks.    Spinal Stenosis Spinal stenosis occurs when the open space (spinal canal) between the bones of your spine (vertebrae) narrows, putting pressure on the spinal cord or nerves. What are the causes? This condition is caused by areas of bone pushing into the central canals of your vertebrae. This condition may be present at birth (congenital), or it may be caused by:  Arthritic deterioration of your vertebrae (spinal degeneration). This usually starts around age 33.  Injury or trauma to the spine.  Tumors in the spine.  Calcium deposits in the spine.  What are the signs or symptoms? Symptoms of this condition include:  Pain in the neck or back that is generally worse with activities, particularly when standing and walking.  Numbness, tingling, hot or cold sensations, weakness, or weariness in your legs.  Pain going up and down the leg (sciatica).  Frequent episodes of falling.  A foot-slapping gait that leads to muscle weakness.  In more serious cases, you may develop:  Problemspassing stool or passing urine.  Difficulty having sex.  Loss of feeling in part or all of your leg.  Symptoms may come on slowly and get worse over time. How is this diagnosed? This condition is diagnosed based on your medical history and a physical exam. Tests will also be done, such as:  MRI.  CT scan.  X-ray.  How is this treated? Treatment for this condition often focuses on managing your pain and any other symptoms. Treatment may include:  Practicing good posture to lessen pressure on your nerves.  Exercising to strengthen muscles, build endurance, improve balance, and  maintain good joint movement (range of motion).  Losing weight, if needed.  Taking medicines to reduce swelling, inflammation, or pain.  Assistive devices, such as a corset or brace.  In some cases, surgery may be needed. The most common procedure is decompression laminectomy. This is done to remove excess bone that puts pressure on your nerve roots. Follow these instructions at home: Managing pain, stiffness, and swelling  Do all exercises and stretches as told by your health care provider.  Practice good posture. If you were given a brace or a corset, wear it as told by your health care provider.  Do not do any activities that cause pain. Ask your health care provider what activities are safe for you.  Do not lift anything that is heavier than 10 lb (4.5 kg) or the limit that your health care provider tells you.  Maintain a healthy weight. Talk with your health care provider if you need help losing weight.  If directed, apply heat to the affected area as often as told by your health care provider. Use the heat source that your health care provider recommends, such as a moist heat pack or a heating pad. ? Place a towel between your skin and the heat source. ? Leave the heat on for 20-30 minutes. ? Remove the heat if your skin turns bright red. This is especially important if you are not able to feel pain, heat, or cold. You may have a greater risk of getting burned. General instructions  Take over-the-counter and prescription medicines only  as told by your health care provider.  Do not use any products that contain nicotine or tobacco, such as cigarettes and e-cigarettes. If you need help quitting, ask your health care provider.  Eat a healthy diet. This includes plenty of fruits and vegetables, whole grains, and low-fat (lean) protein.  Keep all follow-up visits as told by your health care provider. This is important. Contact a health care provider if:  Your symptoms do not get  better or they get worse.  You have a fever. Get help right away if:  You have new or worse pain in your neck or upper back.  You have severe pain that cannot be controlled with medicines.  You are dizzy.  You have vision problems, blurred vision, or double vision.  You have a severe headache that is worse when you stand.  You have nausea or you vomit.  You develop new or worse numbness or tingling in your back or legs.  You have pain, redness, swelling, or warmth in your arm or leg. Summary  Spinal stenosis occurs when the open space (spinal canal) between the bones of your spine (vertebrae) narrows. This narrowing puts pressure on the spinal cord or nerves.  Spinal stenosis can cause numbness, weakness, or pain in the neck, back, and legs.  This condition may be caused by a birth defect, arthritic deterioration of your vertebrae, injury, tumors, or calcium deposits.  This condition is usually diagnosed with MRIs, CT scans, and X-rays. This information is not intended to replace advice given to you by your health care provider. Make sure you discuss any questions you have with your health care provider. Document Released: 10/27/2003 Document Revised: 07/11/2016 Document Reviewed: 07/11/2016 Elsevier Interactive Patient Education  2017 [employer].

## 2017-05-24 ENCOUNTER — Other Ambulatory Visit: Payer: Self-pay | Admitting: Family Medicine

## 2017-05-24 DIAGNOSIS — M47816 Spondylosis without myelopathy or radiculopathy, lumbar region: Secondary | ICD-10-CM

## 2017-05-28 ENCOUNTER — Encounter: Payer: Self-pay | Admitting: Physical Therapy

## 2017-05-28 ENCOUNTER — Other Ambulatory Visit: Payer: Self-pay | Admitting: Osteopathic Medicine

## 2017-05-28 ENCOUNTER — Ambulatory Visit (INDEPENDENT_AMBULATORY_CARE_PROVIDER_SITE_OTHER): Payer: BLUE CROSS/BLUE SHIELD | Admitting: Physical Therapy

## 2017-05-28 DIAGNOSIS — R29898 Other symptoms and signs involving the musculoskeletal system: Secondary | ICD-10-CM

## 2017-05-28 DIAGNOSIS — M6281 Muscle weakness (generalized): Secondary | ICD-10-CM | POA: Diagnosis not present

## 2017-05-28 DIAGNOSIS — Z760 Encounter for issue of repeat prescription: Secondary | ICD-10-CM

## 2017-05-28 DIAGNOSIS — M545 Low back pain, unspecified: Secondary | ICD-10-CM

## 2017-05-28 DIAGNOSIS — G8929 Other chronic pain: Secondary | ICD-10-CM

## 2017-05-28 NOTE — Patient Instructions (Addendum)
Prone Leg Beats    Lie on stomach, forehead on hands. Keep belly tight. Exhale, raising legs, slightly turned out. Inhale, beating heels together for _5___ small beats. Exhale, lowering legs. Repeat __5-6__ times. Do _1___ sessions per day.  Trigger Point Dry Needling  . What is Trigger Point Dry Needling (DN)? o DN is a physical therapy technique used to treat muscle pain and dysfunction. Specifically, DN helps deactivate muscle trigger points (muscle knots).  o A thin filiform needle is used to penetrate the skin and stimulate the underlying trigger point. The goal is for a local twitch response (LTR) to occur and for the trigger point to relax. No medication of any kind is injected during the procedure.   . What Does Trigger Point Dry Needling Feel Like?  o The procedure feels different for each individual patient. Some patients report that they do not actually feel the needle enter the skin and overall the process is not painful. Very mild bleeding may occur. However, many patients feel a deep cramping in the muscle in which the needle was inserted. This is the local twitch response.   Marland Kitchen How Will I feel after the treatment? o Soreness is normal, and the onset of soreness may not occur for a few hours. Typically this soreness does not last longer than two days.  o Bruising is uncommon, however; ice can be used to decrease any possible bruising.  o In rare cases feeling tired or nauseous after the treatment is normal. In addition, your symptoms may get worse before they get better, this period will typically not last longer than 24 hours.   . What Can I do After My Treatment? o Increase your hydration by drinking more water for the next 24 hours. o You may place ice or heat on the areas treated that have become sore, however, do not use heat on inflamed or bruised areas. Heat often brings more relief post needling. o You can continue your regular activities, but vigorous activity is not  recommended initially after the treatment for 24 hours. o DN is best combined with other physical therapy such as strengthening, stretching, and other therapies.

## 2017-05-28 NOTE — Therapy (Signed)
[facility] [place] [address] [place], [place], [address] Phone: [phone]   Fax:  [phone]  Physical Therapy Evaluation  Patient Details  Name: Renee Norris MRN: [id number] Date of Birth: 1966-03-20 Referring Provider: Dr Teressa Lower  Encounter Date: 05/28/2017      PT End of Session - 05/28/17 0849    Visit Number 1   Number of Visits 6   Date for PT Re-Evaluation 07/09/17   PT Start Time 0849   PT Stop Time 0943   PT Time Calculation (min) 54 min   Activity Tolerance Patient tolerated treatment well      Past Medical History:  Diagnosis Date  . Chronic back pain 01/30/2016   folowing with Ortho for Tramadol, we are managing benzo therapy, started TCA as well, refer to PT, refer to [facility] for CBT   . Depression 01/30/2016   TCA to help this as well as pain/sleep issues, refer for CBT   . Insomnia 01/30/2016   TCA, do not take with benzos   . Postmenopausal 01/30/2016   HRT from OBGYN     Past Surgical History:  Procedure Laterality Date  . ABDOMINAL SURGERY    . BREAST SURGERY    . GYN surgery    . TONSILLECTOMY      There were no vitals filed for this visit.       Subjective Assessment - 05/28/17 0852    Subjective Pt was seen for PT about a year ago with minimal relief, then had 5-6 injections without real help. She continues to have pain and returned to MD to see what can help.  This has been going on for 5 years.  She is holding off on having more injections at this time.  She is referred to [place] for DN .   Pertinent History currently using an acupunture mat to help with pain relief 3-4 times a day. Not consistent with yoga/pilates exercise.    How long can you sit comfortably? 5'   How long can you stand comfortably? will have stiffness   How long can you walk comfortably? no limitations   Diagnostic tests MRI disc issues and degeneration   Patient Stated Goals try and decrease pain   Currently in  Pain? Yes  yesterday was awful   Pain Score 3    Pain Location Back   Pain Orientation Right   Pain Descriptors / Indicators Stabbing   Pain Type Chronic pain   Pain Radiating Towards none   Pain Onset More than a month ago   Pain Frequency Constant   Aggravating Factors  sitting, twisting   Pain Relieving Factors medication, acupunture mat, walking            [facility] PT Assessment - 05/28/17 0001      Assessment   Medical Diagnosis Lumbar spondylosis   Referring Provider Dr Teressa Lower   Onset Date/Surgical Date 05/28/12   Hand Dominance Right   Next MD Visit 07/01/17   Prior Therapy yes a year ago     Precautions   Precautions --  no playing tennis or running   Required Braces or Orthoses --  Lt wrist brace - started today     Balance Screen   Has the patient fallen in the past 6 months No     Prior Function   Level of Independence Independent   Vocation Full time employment   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%,    Leisure walk  [place] for massage bed     Observation/Other Assessments   Focus on Therapeutic Outcomes (FOTO)  55% limited     Functional Tests   Functional tests Squat;Single leg stance     Squat   Comments slight shift to the Rt      Single Leg Stance   Comments WNL     Posture/Postural Control   Posture/Postural Control Postural limitations   Postural Limitations Rounded Shoulders;Increased lumbar lordosis     ROM / Strength   AROM / PROM / Strength AROM;Strength     AROM   AROM Assessment Site Lumbar   Lumbar Flexion to floor   Lumbar Extension 50% with pain   Lumbar - Right Side Bend 75% present with pain    Lumbar - Left Side Bend 75% present no pain   Lumbar - Right Rotation WNL   Lumbar - Left Rotation WNL     Strength   Strength Assessment Site Hip;Knee;Ankle;Lumbar   Right/Left Hip Right;Left   Right Hip Flexion 5/5   Right Hip Extension --  5-/5   Right Hip ABduction 4/5   Left Hip Flexion 5/5   Left  Hip Extension --  5-/5   Left Hip ABduction 4+/5   Right/Left Knee --  grossly 5-/5   Right/Left Ankle --  WNL   Lumbar Flexion --  TA good (-)    Lumbar Extension --  multifidi good (-) pain up to 4/10     Palpation   Spinal mobility pain with grade II CPA and bilat UPA mobs L3-5   Palpation comment tight and tender in Rt lumbar paraspinals, QL and gluts, some tenderness in Lt glut.             Objective measurements completed on examination: See above findings.          [facility] Adult PT Treatment/Exercise - 05/28/17 0001      Exercises   Exercises Other Exercises   Other Exercises  cat/cow, 5x5reps prone hip ext with heel taps     Modalities   Modalities Electrical Stimulation;Moist Heat     Moist Heat Therapy   Number Minutes Moist Heat 15 Minutes   Moist Heat Location Lumbar Spine  pt in prone     Electrical Stimulation   Electrical Stimulation Location lumbar   Electrical Stimulation Action IFC   Electrical Stimulation Parameters to tolerance   Electrical Stimulation Goals Pain;Tone     Manual Therapy   Manual Therapy Soft tissue mobilization   Soft tissue mobilization lumbar paraspinals, Rt gluts and QL STM          Trigger Point Dry Needling - 05/28/17 1055    Consent Given? Yes   Education Handout Provided Yes   Muscles Treated Upper Body Quadratus Lumborum;Longissimus   Longissimus Response Palpable increased muscle length;Twitch response elicited  Rt L3-5              PT Education - 05/28/17 1055    Education provided Yes   Education Details HEP and DN   Person(s) Educated Patient   Methods Demonstration;Explanation   Comprehension Verbalized understanding;Returned demonstration             PT Long Term Goals - 05/28/17 0850      PT LONG TERM GOAL #1   Title report =/> 50% reduction in low back pain with daily activity ( 07/09/17)    Time 6   Period Weeks   Status New  PT LONG TERM GOAL #2   Title demo lumbar  ROM and  with no more than 1/10 pain ( 07/09/17)    Time 6   Period Weeks   Status New     PT LONG TERM GOAL #3   Title Improve core strength progressing with functional strengthening program and increasing sitting tolerance to 15 min ( 07/09/17)    Time 6   Period Weeks   Status New     PT LONG TERM GOAL #4   Title Independent in HEP for lumbar stability (07/09/17)   Time 6   Period Weeks   Status New     PT LONG TERM GOAL #5   Title Improve FOTO to </= 42% limitation (07/09/17)    Time 6   Period Weeks   Status New                Plan - 05/28/17 1056    Clinical Impression Statement 51 yo female with long h/o low back pain and MRI showing degeneration and disc issues.  She was unsuccessful with injections in the past.  MD feels it would be worth trying again.  Patient wishes to try PT and needling first.  She is able to activitate her core muscles however fatigues and she has some hip weakness bilat.  She is hypermobile in her joints and this could be leading to some instability in her low back that then causes muscle tightness/spasms in her back and hips.  We will work with patient to find a balance between relaxing the lumbar muscles and keeping stability in the vertebrae. Renee Norris reports some feelings of decreased tightness in her Rt low back after treatment.    Clinical Presentation Stable   Clinical Decision Making Moderate   Rehab Potential Good   PT Frequency 1x / week   PT Duration 6 weeks   PT Treatment/Interventions Moist Heat;Ultrasound;Therapeutic exercise;Dry needling;Manual techniques;Neuromuscular re-education;Cryotherapy;Electrical Stimulation;Iontophoresis /ml Dexamethasone;Patient/family education   PT Next Visit Plan assess response to DN, continue in the low back and gluts   Consulted and Agree with Plan of Care Patient      Patient will benefit from skilled therapeutic intervention in order to improve the following deficits and impairments:  Pain,  Decreased strength, Hypermobility, Increased muscle spasms  Visit Diagnosis: Chronic bilateral low back pain without sciatica - Plan: PT plan of care cert/re-cert  Other symptoms and signs involving the musculoskeletal system - Plan: PT plan of care cert/re-cert  Muscle weakness (generalized) - Plan: PT plan of care cert/re-cert     Problem List Patient Active Problem List   Diagnosis Date Noted  . 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  . 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 Sinus bradycardia, chronic 04/04/2016  . Lumbar spinal stenosis 03/29/2016  . H/O mammogram 02/14/2016  . History of bradycardia 02/14/2016  . Chronic back pain 01/30/2016  . Depression 01/30/2016  . Insomnia 01/30/2016  . Cephalalgia 01/30/2016  . Medication management 01/30/2016  . Other fatigue 01/30/2016  . Postmenopausal 01/30/2016    Roderic Scarce PT  05/28/2017, 12:07 PM  [facility] [address] [place], [place], [address] Phone: [phone]   Fax:  [phone]  Name: Renee Norris MRN: [id number] Date of Birth: [birth year]

## 2017-05-30 DIAGNOSIS — F331 Major depressive disorder, recurrent, moderate: Secondary | ICD-10-CM | POA: Diagnosis not present

## 2017-05-30 DIAGNOSIS — F411 Generalized anxiety disorder: Secondary | ICD-10-CM | POA: Diagnosis not present

## 2017-05-31 DIAGNOSIS — F4010 Social phobia, unspecified: Secondary | ICD-10-CM | POA: Diagnosis not present

## 2017-05-31 DIAGNOSIS — F331 Major depressive disorder, recurrent, moderate: Secondary | ICD-10-CM | POA: Diagnosis not present

## 2017-05-31 DIAGNOSIS — F411 Generalized anxiety disorder: Secondary | ICD-10-CM | POA: Diagnosis not present

## 2017-06-03 DIAGNOSIS — R6882 Decreased libido: Secondary | ICD-10-CM | POA: Diagnosis not present

## 2017-06-03 DIAGNOSIS — E039 Hypothyroidism, unspecified: Secondary | ICD-10-CM | POA: Diagnosis not present

## 2017-06-03 DIAGNOSIS — E7800 Pure hypercholesterolemia, unspecified: Secondary | ICD-10-CM | POA: Diagnosis not present

## 2017-06-03 DIAGNOSIS — E559 Vitamin D deficiency, unspecified: Secondary | ICD-10-CM | POA: Diagnosis not present

## 2017-06-04 ENCOUNTER — Encounter: Payer: Self-pay | Admitting: Physical Therapy

## 2017-06-04 ENCOUNTER — Ambulatory Visit (INDEPENDENT_AMBULATORY_CARE_PROVIDER_SITE_OTHER): Payer: BLUE CROSS/BLUE SHIELD | Admitting: Physical Therapy

## 2017-06-04 DIAGNOSIS — M6281 Muscle weakness (generalized): Secondary | ICD-10-CM

## 2017-06-04 DIAGNOSIS — R29898 Other symptoms and signs involving the musculoskeletal system: Secondary | ICD-10-CM

## 2017-06-04 DIAGNOSIS — G8929 Other chronic pain: Secondary | ICD-10-CM | POA: Diagnosis not present

## 2017-06-04 DIAGNOSIS — M545 Low back pain, unspecified: Secondary | ICD-10-CM

## 2017-06-04 NOTE — Therapy (Signed)
[place], [place], [address] Phone: [phone]   Fax:  [phone]  Physical Therapy Treatment  Patient Details  Name: Renee Norris MRN: [id number] Date of Birth: [birth year] Referring Provider: Dr Steva Colder  Encounter Date: 06/04/2017      PT End of Session - 06/04/17 0843    Visit Number 2   Number of Visits 6   Date for PT Re-Evaluation 07/09/17   PT Start Time 0843   PT Stop Time 0937   PT Time Calculation (min) 54 min   Activity Tolerance Patient tolerated treatment well      Past Medical History:  Diagnosis Date  . Chronic back pain 01/30/2016   folowing with Ortho for Tramadol, we are managing benzo therapy, started TCA as well, refer to PT, refer to [facility] for CBT   . Depression 01/30/2016   TCA to help this as well as pain/sleep issues, refer for CBT   . Insomnia 01/30/2016   TCA, do not take with benzos   . Postmenopausal 01/30/2016   HRT from OBGYN     Past Surgical History:  Procedure Laterality Date  . ABDOMINAL SURGERY    . BREAST SURGERY    . GYN surgery    . TONSILLECTOMY      There were no vitals filed for this visit.      Subjective Assessment - 06/04/17 0843    Subjective Renee Norris states she felt good for most of the week after initial treatment, Pain came back over the weekend, bilat low back.    Patient Stated Goals try and decrease pain   Currently in Pain? Yes   Pain Score 8    Pain Location Back   Pain Orientation Right;Left;Lower   Pain Descriptors / Indicators Stabbing   Pain Type Chronic pain   Pain Onset More than a month ago   Pain Frequency Constant   Aggravating Factors  sitting   Pain Relieving Factors acupunture mat, lying down.             [facility] PT Assessment - 06/04/17 0001      AROM   Lumbar Extension 75% present   Lumbar - Right Side Bend WNL with pain   Lumbar - Left Side Bend WNL with pain                      [facility] Adult PT Treatment/Exercise - 06/04/17 0001      Exercises   Exercises Lumbar     Lumbar Exercises: Stretches   Double Knee to Chest Stretch 30 seconds   Lower Trunk Rotation 10 seconds   Quadruped Mid Back Stretch --  childs pose, cat/cow     Modalities   Modalities Electrical Stimulation;Moist Heat     Moist Heat Therapy   Number Minutes Moist Heat 15 Minutes   Moist Heat Location Lumbar Spine     Electrical Stimulation   Electrical Stimulation Location lumbar   Electrical Stimulation Action IFC   Electrical Stimulation Parameters to tolerance   Electrical Stimulation Goals Pain;Tone     Manual Therapy   Manual Therapy Soft tissue mobilization;Joint mobilization   Joint Mobilization tender with CPA mobs lower lumbar   Soft tissue mobilization tightness in bilat  lumbar paraspinals, Lt > Rt .  Decreased with treatment. STM to this area and into the gluts          Trigger Point Dry Needling - 06/04/17 0848  Consent Given? Yes   Education Handout Provided No   Muscles Treated Upper Body Longissimus   Longissimus Response Palpable increased muscle length;Twitch response elicited  bilat S3-4 with stim                   PT Long Term Goals - 06/04/17 0842      PT LONG TERM GOAL #1   Title report =/> 50% reduction in low back pain with daily activity ( 07/09/17)    Status On-going     PT LONG TERM GOAL #2   Title demo lumbar ROM and  with no more than 1/10 pain ( 07/09/17)    Status Achieved     PT LONG TERM GOAL #3   Title Improve core strength progressing with functional strengthening program and increasing sitting tolerance to 15 min ( 07/09/17)    Status On-going     PT LONG TERM GOAL #4   Title Independent in HEP for lumbar stability (07/09/17)   Status On-going     PT LONG TERM GOAL #5   Title Improve FOTO to </= 42% limitation (07/09/17)    Status On-going               Plan - 06/04/17 0858    Clinical Impression Statement  Renee Norris had good relief after the initial treatment for a couple of days. She continues to have tightness in her low back due to her chronic issues with back pain.  She has increased lumbar ROM with minimal to no pain n her back.  She has met this goal, making progress to the others.    Rehab Potential Good   PT Frequency 1x / week   PT Duration 6 weeks   PT Treatment/Interventions Moist Heat;Ultrasound;Therapeutic exercise;Dry needling;Manual techniques;Neuromuscular re-education;Cryotherapy;Electrical Stimulation;Iontophoresis 72m/ml Dexamethasone;Patient/family education   PT Next Visit Plan assess response to DN, continue in the low back and gluts   Consulted and Agree with Plan of Care Patient      Patient will benefit from skilled therapeutic intervention in order to improve the following deficits and impairments:  Pain, Decreased strength, Hypermobility, Increased muscle spasms  Visit Diagnosis: Chronic bilateral low back pain without sciatica  Other symptoms and signs involving the musculoskeletal system  Muscle weakness (generalized)     Problem List Patient Active Problem List   Diagnosis Date Noted  . 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  . 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 Sinus bradycardia, chronic 04/04/2016  . Lumbar spinal stenosis 03/29/2016  . H/O mammogram 02/14/2016  . History of bradycardia 02/14/2016  . Chronic back pain 01/30/2016  . Depression 01/30/2016  . Insomnia 01/30/2016  . Cephalalgia 01/30/2016  . Medication management 01/30/2016  . Other fatigue 01/30/2016  . Postmenopausal 01/30/2016    SJeral PinchPT 06/04/2017, 9:20 AM  [facility] [address]Phone: [phone]  Fax:  [phone] Name: Renee LAYMONMRN: [id number] of Birth: 909-22-1967

## 2017-06-05 ENCOUNTER — Ambulatory Visit (INDEPENDENT_AMBULATORY_CARE_PROVIDER_SITE_OTHER): Payer: BLUE CROSS/BLUE SHIELD

## 2017-06-05 ENCOUNTER — Other Ambulatory Visit: Payer: Self-pay | Admitting: Family Medicine

## 2017-06-05 DIAGNOSIS — Z1239 Encounter for other screening for malignant neoplasm of breast: Secondary | ICD-10-CM

## 2017-06-05 DIAGNOSIS — Z780 Asymptomatic menopausal state: Secondary | ICD-10-CM | POA: Diagnosis not present

## 2017-06-05 DIAGNOSIS — Z1231 Encounter for screening mammogram for malignant neoplasm of breast: Secondary | ICD-10-CM

## 2017-06-05 DIAGNOSIS — Z13820 Encounter for screening for osteoporosis: Secondary | ICD-10-CM | POA: Diagnosis not present

## 2017-06-06 ENCOUNTER — Encounter: Payer: Self-pay | Admitting: Osteopathic Medicine

## 2017-06-06 ENCOUNTER — Ambulatory Visit (INDEPENDENT_AMBULATORY_CARE_PROVIDER_SITE_OTHER): Payer: BLUE CROSS/BLUE SHIELD | Admitting: Osteopathic Medicine

## 2017-06-06 ENCOUNTER — Encounter: Payer: Self-pay | Admitting: Family Medicine

## 2017-06-06 VITALS — BP 112/77 | HR 67 | Ht 64.5 in | Wt 167.0 lb

## 2017-06-06 DIAGNOSIS — Z0000 Encounter for general adult medical examination without abnormal findings: Secondary | ICD-10-CM

## 2017-06-06 DIAGNOSIS — M47816 Spondylosis without myelopathy or radiculopathy, lumbar region: Secondary | ICD-10-CM | POA: Diagnosis not present

## 2017-06-06 NOTE — Patient Instructions (Addendum)
Plan:  Will request colonoscopy records   Labs look ok to me! Any other questions, let us know  OK to follow-up with me in the next 3-4 months  Call office when due for Tramadol refills - ok to refill until follow-up appointment

## 2017-06-06 NOTE — Progress Notes (Signed)
HPI: Renee Norris is a 51 y.o. female  who presents to [facility] today, 06/06/17,  for chief complaint of:  Chief Complaint  Patient presents with  . Annual Exam     Patient here for annual physical / wellness exam.  See preventive care reviewed as below.  Recent labs reviewed in detail with the patient.   Additional concerns today include: Had labs done as part of option screening program, looks like they did a bunch of hormone levels, "cancer screening" with CA-125 etc.Patient is worried about low T3, TSH is normal. Would like to discuss cholesterol results as well.     Past medical, surgical, social and family history reviewed: Patient Active Problem List   Diagnosis Date Noted  . 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  . 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 Sinus bradycardia, chronic 04/04/2016  . Lumbar spinal stenosis 03/29/2016  . H/O mammogram 02/14/2016  . History of bradycardia 02/14/2016  . Chronic back pain 01/30/2016  . Depression 01/30/2016  . Insomnia 01/30/2016  . Cephalalgia 01/30/2016  . Medication management 01/30/2016  . Other fatigue 01/30/2016  . Postmenopausal 01/30/2016   Past Surgical History:  Procedure Laterality Date  . ABDOMINAL SURGERY    . AUGMENTATION MAMMAPLASTY    . BREAST SURGERY    . GYN surgery    . TONSILLECTOMY     Social History  Substance Use Topics  . Smoking status: Former Games developer  . Smokeless tobacco: Never Used  . Alcohol use No     Comment: 2-3 glasses of wine a day   Family History  Problem Relation Age of Onset  . Diabetes Mother   . Hypertension Mother   . Cancer Mother   . Diabetes Father   . Hypertension Father   . Cancer Father      Current medication list and allergy/intolerance information reviewed:   Current Outpatient Prescriptions  Medication Sig Dispense Refill  . BLACK PEPPER-TURMERIC PO Take by mouth.    .  diazepam (VALIUM) 10 MG tablet TAKE 0.5 TO 1 TABLET BY MOUTH EVERY DAY AS NEEDED FOR SEVERE SPASMS 30 tablet 1  . lamoTRIgine (LAMICTAL) 100 MG tablet TAKE 1 TABLET BY MOUTH EVERY DAY IN THE MORNING  0  . Omega-3 Fatty Acids (OMEGA 3 PO) Take by mouth.    . traMADol (ULTRAM) 50 MG tablet TAKE 1 TABLET BY MOUTH EVERY 12 HOURS AS NEEDED FOR PAIN 50 tablet 2   No current facility-administered medications for this visit.    No Known Allergies    Review of Systems:  Constitutional:  No  fever, no chills, No recent illness, No unintentional weight changes. No significant fatigue.   HEENT: No  headache, no vision change, no hearing change, No sore throat, No  sinus pressure  Cardiac: No  chest pain, No  pressure, No palpitations, No  Orthopnea  Respiratory:  No  shortness of breath. No  Cough  Gastrointestinal: No  abdominal pain, No  nausea, No  vomiting,  No  blood in stool, No  diarrhea, No  constipation   Musculoskeletal: No new myalgia/arthralgia  Skin: No  Rash  Hem/Onc: No  easy bruising/bleeding, No  abnormal lymph node  Neurologic: No  weakness, No  dizziness  Psychiatric: No  concerns with depression, No  concerns with anxiety  Exam:  BP 112/77   Pulse 67   Ht 5' 4.5" (1.638 m)   Wt 167 lb (75.8 kg)  LMP 04/21/2014 (Approximate)   BMI 28.22 kg/m   Constitutional: VS see above. General Appearance: alert, well-developed, well-nourished, NAD  Eyes: Normal lids and conjunctive, non-icteric sclera  Ears, Nose, Mouth, Throat: MMM, Normal external inspection ears/nares/mouth/lips/gums. TM normal bilaterally. Pharynx/tonsils no erythema, no exudate. Nasal mucosa normal.   Neck: No masses, trachea midline. No thyroid enlargement. No tenderness/mass appreciated. No lymphadenopathy  Respiratory: Normal respiratory effort. no wheeze, no rhonchi, no rales  Cardiovascular: S1/S2 normal, no murmur, no rub/gallop auscultated. RRR. No lower extremity edema. Pedal pulse II/IV  bilaterally DP and PT. No carotid bruit or JVD. No abdominal aortic bruit.  Gastrointestinal: Nontender, no masses. No hepatomegaly, no splenomegaly. No hernia appreciated. Bowel sounds normal. Rectal exam deferred.   Musculoskeletal: Gait normal. No clubbing/cyanosis of digits.   Neurological: Normal balance/coordination. No tremor. No cranial nerve deficit on limited exam. Motor and sensation intact and symmetric. Cerebellar reflexes intact.   Skin: warm, dry, intact. No rash/ulcer. No concerning nevi or subq nodules on limited exam.    Psychiatric: Normal judgment/insight. Normal mood and affect. Oriented x3.    No results found for this or any previous visit (from the past 72 hour(s)).  Dg Bone Density  Result Date: 06/05/2017 EXAM: DUAL X-RAY ABSORPTIOMETRY (DXA) FOR BONE MINERAL DENSITY IMPRESSION: Referring Physician:  Rodolph Bong PATIENT: Name: Renee, Norris Patient ID: [id number] Birth Date: April 28, 1966 Height: 64.5 in. Sex: Female Measured: 06/05/2017 Weight: 166.0 lbs. Indications: Caucasian, Estrogen Deficiency, Hypothyroidism, Postmenopausal, Previous Smoker Fractures: Treatments: Calcium, Levothyroxine, Multivitamin, Vitamin D ASSESSMENT: The BMD measured at Femur Total Left is 0.907 g/cm2 with a T-score of -0.8. This patient is considered normal according to World Health Organization [facility]) criteria. L-3 & 4 was excluded due to degenerative changes. Site Region Measured Date Measured Age WHO YA BMD Classification T-score AP Spine L1-L2 06/05/2017 53.0 Normal 1.1 1.310 g/cm2 DualFemur Total Left 4/05/2017 53.0 years Normal -0.8 0.907 g/cm2 World Health Organization [facility]) criteria for post-menopausal, Caucasian Women: Normal       T-score at or above -1 SD Osteopenia   T-score between -1 and -2.5 SD Osteoporosis T-score at or below -2.5 SD RECOMMENDATION: National Osteoporosis Foundation recommends that FDA-approved medical therapies be considered in postmenopausal women and men age 47  or older with a: 1. Hip or vertebral (clinical or morphometric) fracture. 2. T-score of <-2.5 at the spine or hip. 3. Ten-year fracture probability by FRAX of 3% or greater for hip fracture or 20% or greater for major osteoporotic fracture. All treatments decisions require clinical judgment and consideration of individual patient factors, including patient preferences, co-morbidities, previous drug use, risk factors not captured in the FRAX model (e.g. falls, vitamin D deficiency, increased bone turnover, interval significant decline in bone density) and possible under - or over-estimation of fracture risk by FRAX. All patients should ensure an adequate intake of dietary calcium (1200 mg/d) and vitamin D (800 IU daily) unless contraindicated. FOLLOW-UP: People with diagnosed cases of osteoporosis or at high risk for fracture should have regular bone mineral density tests. For patients eligible for Medicare, routine testing is allowed once every 2 years. The testing frequency can be increased to one year for patients who have rapidly progressing disease, those who are receiving or discontinuing medical therapy to restore bone mass, or have additional risk factors. I have reviewed this report and agree with the above findings. [facility] Electronically Signed   By: Bretta Bang III M.D.   On: 06/05/2017 10:26   Mm Screening Breast W/implant  Tomo Bilateral  Result Date: 06/05/2017 CLINICAL DATA:  Screening. EXAM: 2D DIGITAL SCREENING BILATERAL MAMMOGRAM WITH IMPLANTS, CAD AND ADJUNCT TOMO The patient has implants. Standard and implant displaced views were performed. COMPARISON:  Previous exam(s). ACR Breast Density Category b: There are scattered areas of fibroglandular density. FINDINGS: There are no findings suspicious for malignancy. Images were processed with CAD. IMPRESSION: No mammographic evidence of malignancy. A result letter of this screening mammogram will be mailed directly to the  patient. RECOMMENDATION: Screening mammogram in one year. (Code:SM-B-01Y) BI-RADS CATEGORY  1:  Negative. Electronically Signed   By: Sherian ReinWei-Chen  Lin M.D.   On: 06/05/2017 15:19     ASSESSMENT/PLAN:   Annual physical exam  Spondylosis of lumbar region without myelopathy or radiculopathy   Patient Instructions  Plan:  Will request colonoscopy records   Labs look ok to me! Any other questions, let us know  OK to follow-up with me in the next 3-4 months  Call office when due for Tramadol refills - ok to refill until follow-up appointment     FEMALE PREVENTIVE CARE Updated 06/06/17   ANNUAL SCREENING/COUNSELING  Diet/Exercise - HEALTHY HABITS DISCUSSED TO DECREASE CV RISK History  Smoking Status  . Former Smoker quit >15 years ago   Smokeless Tobacco  . Never Used   History  Alcohol Use No    Comment: 2-3 glasses of wine every few days    [facility] 03/14/2017  Decreased Interest 0  Down, Depressed, Hopeless 0  PHQ - 2 Score 0  Altered sleeping 0  Tired, decreased energy 0  Change in appetite 0  Feeling bad or failure about yourself  0  Trouble concentrating 0  Moving slowly or fidgety/restless 0  Suicidal thoughts 0  PHQ-9 Score 0    Domestic violence concerns - no  HTN SCREENING - SEE VITALS  SEXUAL HEALTH  Sexually active in the past year - Yes with female.  Need/want STI testing today? - no  Concerns about libido or pain with sex? - no  INFECTIOUS DISEASE SCREENING  HIV - does not need  GC/CT - does not need  HepC - DOB 1947-1965 - does not need  TB - does not need  DISEASE SCREENING  Lipid - does not need  DM2 - does not need  Osteoporosis - women age 78+ - records reviewed  Fracture after age 53? no and yes  Chronic steroid use? no  Smoking/Alcohol? no  Low body weight <127lb? no  Hip fracture in parent? no  Premature menopause, malabsorbtion, CLD, IBD, RA? no  CANCER SCREENING  Cervical - does not  need  Breast - does not need  Lung - does not need  Colon - does not need - we need records from GI regarding most recent colonoscopy  ADULT VACCINATION  Influenza - annual vaccine recommended  Td - booster every 10 years   Zoster - Shingrix recommended 50+  PCV13 - was not indicated  PPSV23 - was not indicated Immunization History  Administered Date(s) Administered  . Influenza,inj,Quad PF,6+ Mos 05/22/2017  . Tdap 01/05/2015    OTHER  Fall - exercise and Vit D age 78+ - does not need       Visit summary with medication list and pertinent instructions was printed for patient to review. All questions at time of visit were answered - patient instructed to contact office with any additional concerns. ER/RTC precautions were reviewed with the patient. Follow-up plan: Return for pain medication refills 3-4 months .

## 2017-06-07 DIAGNOSIS — F411 Generalized anxiety disorder: Secondary | ICD-10-CM | POA: Diagnosis not present

## 2017-06-07 DIAGNOSIS — F331 Major depressive disorder, recurrent, moderate: Secondary | ICD-10-CM | POA: Diagnosis not present

## 2017-06-11 ENCOUNTER — Encounter: Payer: Self-pay | Admitting: Physical Therapy

## 2017-06-11 ENCOUNTER — Ambulatory Visit (INDEPENDENT_AMBULATORY_CARE_PROVIDER_SITE_OTHER): Payer: BLUE CROSS/BLUE SHIELD | Admitting: Physical Therapy

## 2017-06-11 DIAGNOSIS — R29898 Other symptoms and signs involving the musculoskeletal system: Secondary | ICD-10-CM

## 2017-06-11 DIAGNOSIS — M6281 Muscle weakness (generalized): Secondary | ICD-10-CM

## 2017-06-11 DIAGNOSIS — G8929 Other chronic pain: Secondary | ICD-10-CM

## 2017-06-11 DIAGNOSIS — M545 Low back pain, unspecified: Secondary | ICD-10-CM

## 2017-06-11 NOTE — Patient Instructions (Addendum)
Housework - Vacuuming    Hold the vacuum with arm held at side. Step back and forth to move it, keeping head up. Avoid twisting.   Gardening - Raking =- sweeping   Move close to area to be raked. Use arm movements to do the work. Keep back straight and avoid twisting.   Housework - Sweeping    Use long-handled equipment to avoid stooping.   Copyright  VHI. All rights reserved.

## 2017-06-11 NOTE — Therapy (Signed)
[facility] [address] [place], [place], [address] Phone: [phone]   Fax:  [phone]  Physical Therapy Treatment  Patient Details  Name: Renee Norris MRN: [id number] Date of Birth: 23-Jul-1966 Referring Provider: Dr Teressa LowerE Corey  Encounter Date: 06/11/2017      PT End of Session - 06/11/17 0847    Visit Number 3   Number of Visits 6   Date for PT Re-Evaluation 07/09/17   PT Start Time 0847   PT Stop Time 0943   PT Time Calculation (min) 56 min   Activity Tolerance Patient tolerated treatment well      Past Medical History:  Diagnosis Date  . Chronic back pain 01/30/2016   folowing with Ortho for Tramadol, we are managing benzo therapy, started TCA as well, refer to PT, [facility]   . Depression 01/30/2016   TCA to help this as well as pain/sleep issues, refer for CBT   . Insomnia 01/30/2016   TCA, do not take with benzos   . Postmenopausal 01/30/2016   HRT from OBGYN     Past Surgical History:  Procedure Laterality Date  . ABDOMINAL SURGERY    . AUGMENTATION MAMMAPLASTY    . BREAST SURGERY    . GYN surgery    . TONSILLECTOMY      There were no vitals filed for this visit.      Subjective Assessment - 06/11/17 0848    Subjective Renee Norris said her pain is a lot worse, she thinks it may be stress related as her husband is having a TKA and she is nervous about it.    Patient Stated Goals try and decrease pain   Currently in Pain? Yes   Pain Score 7    Pain Location Back   Pain Orientation Left;Right;Lower;Mid   Pain Descriptors / Indicators Stabbing   Pain Type Chronic pain   Pain Onset More than a month ago   Pain Frequency Constant   Aggravating Factors  sitting, lying down in any position   Pain Relieving Factors acupunture mat some.                          [facility] Adult PT Treatment/Exercise - 06/11/17 0001      Lumbar Exercises: Stretches   Quadruped Mid Back  Stretch --  childs pose   ITB Stretch --  cross body stretch with strap.      Modalities   Modalities Electrical Stimulation;Moist Heat     Moist Heat Therapy   Number Minutes Moist Heat 20 Minutes   Moist Heat Location Lumbar Spine     Electrical Stimulation   Electrical Stimulation Location lumbar   Electrical Stimulation Action IFC   Electrical Stimulation Parameters to tolerance   Electrical Stimulation Goals Pain;Tone     Manual Therapy   Manual Therapy Soft tissue mobilization;Joint mobilization   Joint Mobilization focus on grade II Lt UPA L3-4, and L4 CPA   Soft tissue mobilization STm to bilat lumbar and lower thoracic paraspinals           Trigger Point Dry Needling - 06/11/17 1238    Consent Given? Yes   Education Handout Provided No   Muscles Treated Upper Body Longissimus   Longissimus Response Palpable increased muscle length;Twitch response elicited  bilat L4-T11                   PT Long Term  Goals - 06/04/17 0842      PT LONG TERM GOAL #1   Title report =/> 50% reduction in low back pain with daily activity ( 07/09/17)    Status On-going     PT LONG TERM GOAL #2   Title demo lumbar ROM and  with no more than 1/10 pain ( 07/09/17)    Status Achieved     PT LONG TERM GOAL #3   Title Improve core strength progressing with functional strengthening program and increasing sitting tolerance to 15 min ( 07/09/17)    Status On-going     PT LONG TERM GOAL #4   Title Independent in HEP for lumbar stability (07/09/17)   Status On-going     PT LONG TERM GOAL #5   Title Improve FOTO to </= 42% limitation (07/09/17)    Status On-going             Patient will benefit from skilled therapeutic intervention in order to improve the following deficits and impairments:     Visit Diagnosis: Chronic bilateral low back pain without sciatica  Other symptoms and signs involving the musculoskeletal system  Muscle weakness  (generalized)     Problem List Patient Active Problem List   Diagnosis Date Noted  . 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  . 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 Sinus bradycardia, chronic 04/04/2016  . Lumbar spinal stenosis 03/29/2016  . H/O mammogram 02/14/2016  . History of bradycardia 02/14/2016  . Chronic back pain 01/30/2016  . Depression 01/30/2016  . Insomnia 01/30/2016  . Cephalalgia 01/30/2016  . Medication management 01/30/2016  . Other fatigue 01/30/2016  . Postmenopausal 01/30/2016    Roderic Scarce PT 06/11/2017, 12:39 PM  [facility] [address] [place], [place], [address] Phone: [phone]   Fax:  [phone]  Name: Renee Norris MRN: [id number] Date of Birth: March 29, 1966

## 2017-06-18 ENCOUNTER — Encounter: Payer: Self-pay | Admitting: Physical Therapy

## 2017-06-18 ENCOUNTER — Ambulatory Visit (INDEPENDENT_AMBULATORY_CARE_PROVIDER_SITE_OTHER): Payer: BLUE CROSS/BLUE SHIELD | Admitting: Physical Therapy

## 2017-06-18 DIAGNOSIS — M545 Low back pain, unspecified: Secondary | ICD-10-CM

## 2017-06-18 DIAGNOSIS — G8929 Other chronic pain: Secondary | ICD-10-CM | POA: Diagnosis not present

## 2017-06-18 DIAGNOSIS — R29898 Other symptoms and signs involving the musculoskeletal system: Secondary | ICD-10-CM | POA: Diagnosis not present

## 2017-06-18 DIAGNOSIS — M6281 Muscle weakness (generalized): Secondary | ICD-10-CM

## 2017-06-18 NOTE — Therapy (Signed)
[facility] [place] [address] [place], [place], [address] Phone: [phone]   Fax:  [phone]  Physical Therapy Treatment  Patient Details  Name: Renee Norris MRN: [id number] Date of Birth: April 19, 1966 Referring Provider: Dr Teressa Lower  Encounter Date: 06/18/2017      PT End of Session - 06/18/17 0839    Visit Number 4   Number of Visits 6   Date for PT Re-Evaluation 07/09/17   PT Start Time 0839   PT Stop Time 0939   PT Time Calculation (min) 60 min   Activity Tolerance Patient tolerated treatment well      Past Medical History:  Diagnosis Date  . Chronic back pain 01/30/2016   folowing with Ortho for Tramadol, we are managing benzo therapy, started TCA as well, refer to PT, refer to [facility] for CBT   . Depression 01/30/2016   TCA to help this as well as pain/sleep issues, refer for CBT   . Insomnia 01/30/2016   TCA, do not take with benzos   . Postmenopausal 01/30/2016   HRT from OBGYN     Past Surgical History:  Procedure Laterality Date  . ABDOMINAL SURGERY    . AUGMENTATION MAMMAPLASTY    . BREAST SURGERY    . GYN surgery    . TONSILLECTOMY      There were no vitals filed for this visit.      Subjective Assessment - 06/18/17 0839    Subjective Renee Norris has been helping care for her husband after his knee surgery.  He is not doing well and Renee Norris is having to help him after having two falls. She reports her back is the same, not worse.  she feels some pain relief after needling, lasting about a day this time.    Patient Stated Goals try and decrease pain   Currently in Pain? Yes   Pain Score 7    Pain Location Back   Pain Orientation Left;Right;Lower   Pain Descriptors / Indicators Stabbing   Pain Type Chronic pain   Pain Onset More than a month ago   Pain Frequency Constant   Aggravating Factors  sitting   Pain Relieving Factors lying down is better now and acupunture mat.                           [facility] Adult PT Treatment/Exercise - 06/18/17 0001      Exercises   Exercises Lumbar     Lumbar Exercises: Stretches   Lower Trunk Rotation 3 reps   Quadruped Mid Back Stretch Limitations childs pose    Prone Mid Back Stretch Limitations supine morning stretch over arched bolsters with deep breathing     Modalities   Modalities Electrical Stimulation;Moist Heat     Moist Heat Therapy   Number Minutes Moist Heat 20 Minutes   Moist Heat Location Lumbar Spine  and buttocks in prone     Electrical Stimulation   Electrical Stimulation Location lumbar and buttocks in prone   Electrical Stimulation Action IFC   Electrical Stimulation Parameters to tolerance   Electrical Stimulation Goals Pain;Tone     Manual Therapy   Manual Therapy Soft tissue mobilization   Soft tissue mobilization STm to bilat lumbar and lower thoracic paraspinals , bilat buttocks,  Lt buttocks and llumbar paraspinals tighter than Rt, Rt lower thoracic paraspinals tigher than Lt           Trigger Point Dry  Needling - 06/18/17 [id number]    Consent Given? Yes   Education Handout Provided No   Muscles Treated Upper Body Longissimus   Muscles Treated Lower Body Gluteus minimus;Gluteus maximus;Piriformis   Longissimus Response Palpable increased muscle length;Twitch response elicited  bilat with stim, Lt L4-T12, Rt L3-T9   Gluteus Maximus Response Palpable increased muscle length;Twitch response elicited  bilat   Gluteus Minimus Response Palpable increased muscle length;Twitch response elicited  bilat   Piriformis Response Palpable increased muscle length;Twitch response elicited  bilat                   PT Long Term Goals - 06/18/17 0927      PT LONG TERM GOAL #1   Title report =/> 50% reduction in low back pain with daily activity ( 07/09/17)    Status On-going     PT LONG TERM GOAL #2   Title demo lumbar ROM and  with no more than 1/10 pain ( 07/09/17)     Status Achieved     PT LONG TERM GOAL #3   Title Improve core strength progressing with functional strengthening program and increasing sitting tolerance to 15 min ( 07/09/17)    Status On-going     PT LONG TERM GOAL #4   Title Independent in HEP for lumbar stability (07/09/17)   Status On-going     PT LONG TERM GOAL #5   Title Improve FOTO to </= 42% limitation (07/09/17)    Status On-going             Patient will benefit from skilled therapeutic intervention in order to improve the following deficits and impairments:     Visit Diagnosis: Chronic bilateral low back pain without sciatica  Other symptoms and signs involving the musculoskeletal system  Muscle weakness (generalized)     Problem List Patient Active Problem List   Diagnosis Date Noted  . 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  . 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 Sinus bradycardia, chronic 04/04/2016  . Lumbar spinal stenosis 03/29/2016  . H/O mammogram 02/14/2016  . History of bradycardia 02/14/2016  . Chronic back pain 01/30/2016  . Depression 01/30/2016  . Insomnia 01/30/2016  . Cephalalgia 01/30/2016  . Medication management 01/30/2016  . Other fatigue 01/30/2016  . Postmenopausal 01/30/2016    Roderic ScarceSusan Shaver PT  06/18/2017, 9:28 AM  [facility] [address] [place], [place], [address] Phone: [phone]   Fax:  [phone]  Name: Renee Norris MRN: [id number] Date of Birth: 11-04-1965

## 2017-06-19 ENCOUNTER — Other Ambulatory Visit: Payer: BLUE CROSS/BLUE SHIELD

## 2017-06-27 ENCOUNTER — Ambulatory Visit (INDEPENDENT_AMBULATORY_CARE_PROVIDER_SITE_OTHER): Payer: BLUE CROSS/BLUE SHIELD | Admitting: Physical Therapy

## 2017-06-27 ENCOUNTER — Encounter: Payer: Self-pay | Admitting: Physical Therapy

## 2017-06-27 DIAGNOSIS — R29898 Other symptoms and signs involving the musculoskeletal system: Secondary | ICD-10-CM | POA: Diagnosis not present

## 2017-06-27 DIAGNOSIS — M545 Low back pain, unspecified: Secondary | ICD-10-CM

## 2017-06-27 DIAGNOSIS — G8929 Other chronic pain: Secondary | ICD-10-CM | POA: Diagnosis not present

## 2017-06-27 DIAGNOSIS — M6281 Muscle weakness (generalized): Secondary | ICD-10-CM | POA: Diagnosis not present

## 2017-06-27 NOTE — Therapy (Signed)
[place], [place], [address] Phone: [phone]   Fax:  [phone]  Physical Therapy Treatment  Patient Details  Name: Renee Norris MRN: [id number] Date of Birth: 01-02-1966 Referring Provider: Dr Steva Colder   Encounter Date: 06/27/2017  PT End of Session - 06/27/17 0847    Visit Number  5    Number of Visits  6    Date for PT Re-Evaluation  07/09/17    PT Start Time  3546    PT Stop Time  0934    PT Time Calculation (min)  47 min    Activity Tolerance  Patient tolerated treatment well       Past Medical History:  Diagnosis Date  . Chronic back pain 01/30/2016   folowing with Ortho for Tramadol, we are managing benzo therapy, started TCA as well, refer to PT, refer to [facility] for CBT   . Depression 01/30/2016   TCA to help this as well as pain/sleep issues, refer for CBT   . Insomnia 01/30/2016   TCA, do not take with benzos   . Postmenopausal 01/30/2016   HRT from OBGYN     Past Surgical History:  Procedure Laterality Date  . ABDOMINAL SURGERY    . AUGMENTATION MAMMAPLASTY    . BREAST SURGERY    . GYN surgery    . TONSILLECTOMY      There were no vitals filed for this visit.  Subjective Assessment - 06/27/17 0848    Subjective  Renee Norris reports her pain is not intermittent it does move from side to side.     How long can you sit comfortably?  5' still    Currently in Pain?  Yes    Pain Score  3     Pain Location  Hip    Pain Orientation  Right;Left    Pain Descriptors / Indicators  Aching    Pain Type  Chronic pain    Pain Onset  More than a month ago    Pain Frequency  Intermittent    Aggravating Factors   sitting     Pain Relieving Factors  standing                      [facility] Adult PT Treatment/Exercise - 06/27/17 0001      Modalities   Modalities  Electrical Stimulation;Moist Heat      Moist Heat Therapy   Number Minutes Moist Heat  20 Minutes    Moist  Heat Location  -- upper gluts   upper gluts     Electrical Stimulation   Electrical Stimulation Location  upper gluts    Electrical Stimulation Action  IFC    Electrical Stimulation Parameters  to tolerance    Electrical Stimulation Goals  Pain;Tone      Manual Therapy   Manual Therapy  Soft tissue mobilization;Myofascial release    Soft tissue mobilization  STM to bilat buttocks/gluts, piriformis    Myofascial Release  soft tissue releases on either side of the sacrum       Trigger Point Dry Needling - 06/27/17 0851    Consent Given?  Yes    Education Handout Provided  No    Muscles Treated Lower Body  Gluteus maximus;Gluteus minimus    Gluteus Maximus Response  Palpable increased muscle length;Twitch response elicited bilat   bilat   Gluteus Minimus Response  Palpable increased muscle length;Twitch response elicited bilay   bilay  PT Long Term Goals - 06/27/17 3299      PT LONG TERM GOAL #1   Title  report =/> 50% reduction in low back pain with daily activity ( 07/09/17)     Status  Achieved Pt reports 50% improvement   Pt reports 50% improvement     PT LONG TERM GOAL #2   Title  demo lumbar ROM and  with no more than 1/10 pain ( 07/09/17)     Status  Achieved      PT LONG TERM GOAL #3   Title  Improve core strength progressing with functional strengthening program and increasing sitting tolerance to 15 min ( 07/09/17)     Status  On-going      PT LONG TERM GOAL #4   Title  Independent in HEP for lumbar stability (07/09/17)    Status  On-going      PT LONG TERM GOAL #5   Title  Improve FOTO to </= 42% limitation (07/09/17)     Status  On-going            Plan - 06/27/17 0919    Clinical Impression Statement  Renee Norris continues to see improvement, reports this is working better than the injections did,  She has met another goal and making progress to the rest of them.  She was mainly tight and tender in the upper gluts and around her  sacrum,  had good releases with manual work today.     Rehab Potential  Good    PT Frequency  1x / week    PT Duration  6 weeks    PT Treatment/Interventions  Moist Heat;Ultrasound;Therapeutic exercise;Dry needling;Manual techniques;Neuromuscular re-education;Cryotherapy;Electrical Stimulation;Iontophoresis 48m/ml Dexamethasone;Patient/family education    PT Next Visit Plan  FOTO and reassess    Consulted and Agree with Plan of Care  Patient       Patient will benefit from skilled therapeutic intervention in order to improve the following deficits and impairments:  Pain, Decreased strength, Hypermobility, Increased muscle spasms  Visit Diagnosis: Chronic bilateral low back pain without sciatica  Other symptoms and signs involving the musculoskeletal system  Muscle weakness (generalized)     Problem List Patient Active Problem List   Diagnosis Date Noted  . 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  . 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 Sinus bradycardia, chronic 04/04/2016  . Lumbar spinal stenosis 03/29/2016  . H/O mammogram 02/14/2016  . History of bradycardia 02/14/2016  . Chronic back pain 01/30/2016  . Depression 01/30/2016  . Insomnia 01/30/2016  . Cephalalgia 01/30/2016  . Medication management 01/30/2016  . Other fatigue 01/30/2016  . Postmenopausal 01/30/2016    SJeral PinchPT  06/27/2017, 9:21 AM  [facility] [place] [address]Phone: [phone]  Fax:  [phone] Name: Renee STEGMAIERMRN: [id number] of Birth: [birth year]

## 2017-06-28 DIAGNOSIS — F331 Major depressive disorder, recurrent, moderate: Secondary | ICD-10-CM | POA: Diagnosis not present

## 2017-06-28 DIAGNOSIS — F411 Generalized anxiety disorder: Secondary | ICD-10-CM | POA: Diagnosis not present

## 2017-07-01 ENCOUNTER — Ambulatory Visit (INDEPENDENT_AMBULATORY_CARE_PROVIDER_SITE_OTHER): Payer: BLUE CROSS/BLUE SHIELD | Admitting: Family Medicine

## 2017-07-01 ENCOUNTER — Encounter: Payer: Self-pay | Admitting: Family Medicine

## 2017-07-01 VITALS — BP 121/75 | HR 62 | Wt 160.0 lb

## 2017-07-01 DIAGNOSIS — G8929 Other chronic pain: Secondary | ICD-10-CM | POA: Diagnosis not present

## 2017-07-01 DIAGNOSIS — M5416 Radiculopathy, lumbar region: Secondary | ICD-10-CM | POA: Diagnosis not present

## 2017-07-01 DIAGNOSIS — M545 Low back pain, unspecified: Secondary | ICD-10-CM

## 2017-07-01 DIAGNOSIS — M5116 Intervertebral disc disorders with radiculopathy, lumbar region: Secondary | ICD-10-CM | POA: Diagnosis not present

## 2017-07-01 DIAGNOSIS — M4316 Spondylolisthesis, lumbar region: Secondary | ICD-10-CM | POA: Diagnosis not present

## 2017-07-01 DIAGNOSIS — M48061 Spinal stenosis, lumbar region without neurogenic claudication: Secondary | ICD-10-CM | POA: Diagnosis not present

## 2017-07-01 DIAGNOSIS — M4726 Other spondylosis with radiculopathy, lumbar region: Secondary | ICD-10-CM | POA: Diagnosis not present

## 2017-07-01 NOTE — Patient Instructions (Signed)
Thank you for coming in today. Try to get me info about these out of the country surgery options you have researched.  Please schedule the ablation.  Call [facility] [phone]  Recheck as needed.

## 2017-07-01 NOTE — Progress Notes (Signed)
   Renee Norris is a 51 y.o. female who [facility] today for follow up back pain. Inetta Fermoina has chronic low back pain facet DJD.  In the interim has been attending physical therapy and had mild improvement in symptoms.  She has had ablation scheduled notes that is sometimes difficult to schedule her lumbar ablation due to family obligations.  She is interested in having a surgery [place] I would like my opinion about this.   Past Medical History:  Diagnosis Date  . Chronic back pain 01/30/2016   folowing with Ortho for Tramadol, we are managing benzo therapy, started TCA as well, refer to PT, [facility]   . Depression 01/30/2016   TCA to help this as well as pain/sleep issues, refer for CBT   . Insomnia 01/30/2016   TCA, do not take with benzos   . Postmenopausal 01/30/2016   HRT from OBGYN    Past Surgical History:  Procedure Laterality Date  . ABDOMINAL SURGERY    . AUGMENTATION MAMMAPLASTY    . BREAST SURGERY    . GYN surgery    . TONSILLECTOMY     Social History   Tobacco Use  . Smoking status: Former Games developermoker  . Smokeless tobacco: Never Used  Substance Use Topics  . Alcohol use: No    Comment: 2-3 glasses of wine a day     ROS:  As above   Medications: Current Outpatient Medications  Medication Sig Dispense Refill  . BLACK PEPPER-TURMERIC PO Take by mouth.    . diazepam (VALIUM) 10 MG tablet TAKE 0.5 TO 1 TABLET BY MOUTH EVERY DAY AS NEEDED FOR SEVERE SPASMS 30 tablet 1  . lamoTRIgine (LAMICTAL) 100 MG tablet TAKE 1 TABLET BY MOUTH EVERY DAY IN THE MORNING  0  . Omega-3 Fatty Acids (OMEGA 3 PO) Take by mouth.    . traMADol (ULTRAM) 50 MG tablet TAKE 1 TABLET BY MOUTH EVERY 12 HOURS AS NEEDED FOR PAIN 50 tablet 2   No current facility-administered medications for this visit.    No Known Allergies   Exam:  BP 121/75   Pulse 62   Wt 160 lb (72.6 kg)   LMP 04/21/2014 (Approximate)   BMI  27.04 kg/m  General: Well Developed, well nourished, and in no acute distress.  Neuro/Psych: Alert and oriented x3, extra-ocular muscles intact, able to move all 4 extremities, sensation grossly intact. Skin: Warm and dry, no rashes noted.  Respiratory: Not using accessory muscles, speaking in full sentences, trachea midline.  Cardiovascular: Pulses palpable, no extremity edema. Abdomen: Does not appear distended. MSK:  L-spine normal motion and normal gait.     No results found for this or any previous visit (from the past 48 hour(s)). No results found.    Assessment and Plan: 51 y.o. female with lumbar spine chronic pain.  Likely due to degenerative disease.  Patient had some benefit with physical therapy.  Will proceed with ablation.  Patient will get information about her potential out of the country surgery which I will do some research on. Recheck as needed.    No orders of the defined types were placed in this encounter.  No orders of the defined types were placed in this encounter.   Discussed warning signs or symptoms. Please see discharge instructions. Patient expresses understanding.

## 2017-07-02 ENCOUNTER — Ambulatory Visit (INDEPENDENT_AMBULATORY_CARE_PROVIDER_SITE_OTHER): Payer: BLUE CROSS/BLUE SHIELD | Admitting: Physical Therapy

## 2017-07-02 DIAGNOSIS — G8929 Other chronic pain: Secondary | ICD-10-CM | POA: Diagnosis not present

## 2017-07-02 DIAGNOSIS — M545 Low back pain, unspecified: Secondary | ICD-10-CM

## 2017-07-02 DIAGNOSIS — M6281 Muscle weakness (generalized): Secondary | ICD-10-CM

## 2017-07-02 DIAGNOSIS — R29898 Other symptoms and signs involving the musculoskeletal system: Secondary | ICD-10-CM | POA: Diagnosis not present

## 2017-07-02 NOTE — Therapy (Addendum)
[place], [place], [address] Phone: [phone]   Fax:  [phone]  Physical Therapy Treatment  Patient Details  Name: SHANETTA NICOLLS MRN: [id number] Date of Birth: Oct 30, 1965 Referring Provider: Dr Steva Colder   Encounter Date: 07/02/2017  PT End of Session - 07/02/17 0852    Visit Number  6    Number of Visits  9    Date for PT Re-Evaluation  08/13/17    PT Start Time  0852    PT Stop Time  0952    PT Time Calculation (min)  60 min    Activity Tolerance  Patient tolerated treatment well       Past Medical History:  Diagnosis Date  . Chronic back pain 01/30/2016   folowing with Ortho for Tramadol, we are managing benzo therapy, started TCA as well, refer to PT, refer to [facility] for CBT   . Depression 01/30/2016   TCA to help this as well as pain/sleep issues, refer for CBT   . Insomnia 01/30/2016   TCA, do not take with benzos   . Postmenopausal 01/30/2016   HRT from OBGYN     Past Surgical History:  Procedure Laterality Date  . ABDOMINAL SURGERY    . AUGMENTATION MAMMAPLASTY    . BREAST SURGERY    . GYN surgery    . TONSILLECTOMY      There were no vitals filed for this visit.  Subjective Assessment - 07/02/17 0852    Subjective  Saw her MD, he wants her to have the ablation, it is scheduled however she is not sure she really wants it.  Had a second opinion from and ortho MD and he thinks it is facet joint inflammation from arthritis, wants to do an injection on the Rt  facet has appointment 07/26/17. She will being seeing another MD the week after to discuss    Pertinent History  currently using an acupunture mat to help with pain relief 3-4 times a day. Not consistent with yoga/pilates exercise.     How long can you sit comfortably?  30' now.     How long can you walk comfortably?  no limitations    Patient Stated Goals  try and decrease pain    Currently in Pain?  Yes    Pain  Score  1     Pain Location  Hip    Pain Orientation  Left    Pain Descriptors / Indicators  Aching deep    Pain Type  Chronic pain    Pain Onset  More than a month ago    Pain Frequency  Intermittent    Aggravating Factors   sitting    Pain Relieving Factors  standing         [facility] PT Assessment - 07/02/17 0001      Assessment   Medical Diagnosis  Lumbar spondylosis      Squat   Comments  WNL      AROM   Lumbar Flexion  WNL    Lumbar Extension  50% present, no pain    Lumbar - Right Side Bend  WNL    Lumbar - Left Side Bend  WNL    Lumbar - Right Rotation  WNL    Lumbar - Left Rotation  WNL      Strength   Right Hip ABduction  4+/5    Left Hip ABduction  -- 5-/5  [facility] Adult PT Treatment/Exercise - 07/02/17 0001      Modalities   Modalities  Electrical Stimulation;Moist Heat      Moist Heat Therapy   Number Minutes Moist Heat  20 Minutes    Moist Heat Location  Lumbar Spine an dupper gluts      Electrical Stimulation   Electrical Stimulation Location  lumbar and upper gluts    Electrical Stimulation Action  IFC    Electrical Stimulation Parameters  to tolerance    Electrical Stimulation Goals  Pain;Tone      Manual Therapy   Manual Therapy  Soft tissue mobilization;Myofascial release    Soft tissue mobilization  STM to bilat buttocks/gluts, piriformis    Myofascial Release  lumbar paraspinals       Trigger Point Dry Needling - 07/02/17 0903    Consent Given?  Yes    Education Handout Provided  No    Muscles Treated Upper Body  Longissimus    Muscles Treated Lower Body  Gluteus maximus;Gluteus minimus    Longissimus Response  Palpable increased muscle length;Twitch response elicited bilat with stim    Gluteus Maximus Response  Palpable increased muscle length;Twitch response elicited bilat, Lt side tighter than rt today.     Gluteus Minimus Response  Palpable increased muscle length;Twitch response elicited bilat                 PT Long Term Goals - 07/02/17 0942      PT LONG TERM GOAL #1   Title  report =/> 75% reduction in low back pain with daily activity ( 08/13/17)     Time  6    Period  Weeks    Status  Revised she had met the previous goal of 50% improvement      PT LONG TERM GOAL #2   Title  demo lumbar ROM and  with no more than 1/10 pain ( 07/09/17)     Status  Achieved      PT LONG TERM GOAL #3   Title  Improve core strength progressing with functional strengthening program and increasing sitting tolerance to 15 min ( 07/09/17)     Status  Achieved      PT LONG TERM GOAL #4   Title  Independent in HEP for lumbar stability (08/13/17)    Time  6    Period  Weeks    Status  On-going      PT LONG TERM GOAL #5   Title  Improve FOTO to </= 42% limitation (07/14/17)     Status  Achieved 39% limited      Additional Long Term Goals   Additional Long Term Goals  Yes      PT LONG TERM GOAL #6   Title  tolerate sitting =/> 30' to sit through a TV show ( 08/13/17)     Time  6    Period  Weeks    Status  New      PT LONG TERM GOAL #7   Title  demo bilat hip strength =/> 5-/5 ( 08/13/17)     Time  [place] - 07/02/17 0938    Clinical Impression Statement  Lillieann has been making steady progress with PT, tolerating sitting for 15 minutes now. Her muscles are less tight than initially however she still has some tightness in her gluts and  intermittently into her low back..  She is looking into getting a couple different types of injections and is still learning about potential surgery.  ideally she would like to not need surgery.  She has met most of her goals, new ones have been set and would benefit from more therapy to help her achieve maximal benefits.     Rehab Potential  Good    PT Frequency  Biweekly    PT Duration  6 weeks    PT Treatment/Interventions  Moist Heat;Ultrasound;Therapeutic exercise;Dry needling;Manual  techniques;Neuromuscular re-education;Cryotherapy;Electrical Stimulation;Iontophoresis 4mg/ml Dexamethasone;Patient/family education    PT Next Visit Plan  decrease frequency to every other week.     Consulted and Agree with Plan of Care  Patient       Patient will benefit from skilled therapeutic intervention in order to improve the following deficits and impairments:  Pain, Decreased strength, Hypermobility, Increased muscle spasms  Visit Diagnosis: Chronic bilateral low back pain without sciatica - Plan: PT plan of care cert/re-cert  Other symptoms and signs involving the musculoskeletal system - Plan: PT plan of care cert/re-cert  Muscle weakness (generalized) - Plan: PT plan of care cert/re-cert     Problem List Patient Active Problem List   Diagnosis Date Noted  . 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  . 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 Sinus bradycardia, chronic 04/04/2016  . Lumbar spinal stenosis 03/29/2016  . H/O mammogram 02/14/2016  . History of bradycardia 02/14/2016  . Chronic back pain 01/30/2016  . Depression 01/30/2016  . Insomnia 01/30/2016  . Cephalalgia 01/30/2016  . Medication management 01/30/2016  . Other fatigue 01/30/2016  . Postmenopausal 01/30/2016      PT  07/02/2017, 9:48 AM  [facility] [address] [place], [place], [address] Phone: [phone]   Fax:  [phone]  Name: Leylany M Ciullo MRN: [id number] Date of Birth: 10/18/1965  PHYSICAL THERAPY DISCHARGE SUMMARY   Visits from Start of Care: 6  Current functional level related to goals / functional outcomes: See last progress note for discharge status. Patient making progress with PT and recommendation was to continue treatment. However, patient has not scheduled appointments since last visit.   Remaining deficits: Unknown    Education /  Equipment: HEP Plan: Patient agrees to discharge.  Patient goals were partially met. Patient is being discharged due to not returning since the last visit.  ?????     Celyn P. Holt PT, MPH 07/29/17 2:23 PM   

## 2017-07-05 DIAGNOSIS — F411 Generalized anxiety disorder: Secondary | ICD-10-CM | POA: Diagnosis not present

## 2017-07-05 DIAGNOSIS — F331 Major depressive disorder, recurrent, moderate: Secondary | ICD-10-CM | POA: Diagnosis not present

## 2017-07-16 ENCOUNTER — Encounter: Payer: Self-pay | Admitting: Family Medicine

## 2017-07-16 DIAGNOSIS — G8929 Other chronic pain: Secondary | ICD-10-CM

## 2017-07-16 DIAGNOSIS — M545 Low back pain, unspecified: Secondary | ICD-10-CM

## 2017-07-16 MED ORDER — AMBULATORY NON FORMULARY MEDICATION: 0 refills | Status: DC

## 2017-07-17 ENCOUNTER — Encounter: Payer: BLUE CROSS/BLUE SHIELD | Admitting: Physical Therapy

## 2017-07-19 DIAGNOSIS — F411 Generalized anxiety disorder: Secondary | ICD-10-CM | POA: Diagnosis not present

## 2017-07-19 DIAGNOSIS — F331 Major depressive disorder, recurrent, moderate: Secondary | ICD-10-CM | POA: Diagnosis not present

## 2017-07-26 DIAGNOSIS — M47816 Spondylosis without myelopathy or radiculopathy, lumbar region: Secondary | ICD-10-CM | POA: Diagnosis not present

## 2017-07-26 DIAGNOSIS — M5416 Radiculopathy, lumbar region: Secondary | ICD-10-CM | POA: Diagnosis not present

## 2017-07-28 ENCOUNTER — Other Ambulatory Visit: Payer: Self-pay | Admitting: Osteopathic Medicine

## 2017-07-30 ENCOUNTER — Other Ambulatory Visit: Payer: BLUE CROSS/BLUE SHIELD

## 2017-07-30 ENCOUNTER — Inpatient Hospital Stay: Admission: RE | Admit: 2017-07-30 | Payer: BLUE CROSS/BLUE SHIELD | Source: Ambulatory Visit

## 2017-08-02 ENCOUNTER — Ambulatory Visit
Admission: RE | Admit: 2017-08-02 | Discharge: 2017-08-02 | Disposition: A | Discharge status: Home / Self Care | Payer: BLUE CROSS/BLUE SHIELD | Source: Ambulatory Visit | Attending: Family Medicine | Admitting: Family Medicine

## 2017-08-02 ENCOUNTER — Other Ambulatory Visit: Payer: Self-pay | Admitting: Family Medicine

## 2017-08-02 DIAGNOSIS — M47816 Spondylosis without myelopathy or radiculopathy, lumbar region: Secondary | ICD-10-CM

## 2017-08-02 DIAGNOSIS — M47817 Spondylosis without myelopathy or radiculopathy, lumbosacral region: Secondary | ICD-10-CM | POA: Diagnosis not present

## 2017-08-02 NOTE — Discharge Instructions (Signed)

## 2017-08-06 ENCOUNTER — Other Ambulatory Visit: Payer: BLUE CROSS/BLUE SHIELD

## 2017-08-06 MED ORDER — SODIUM CHLORIDE 0.9 % IV SOLN
Freq: Once | INTRAVENOUS | Status: AC
  Administered 2017-08-07: 08:00:00 via INTRAVENOUS

## 2017-08-06 MED ORDER — MIDAZOLAM HCL 2 MG/2ML IJ SOLN
1.0000 mg | INTRAMUSCULAR | Status: DC | PRN
  Administered 2017-08-07 (×2): 1 mg via INTRAVENOUS
  Administered 2017-08-07: 0.5 mg via INTRAVENOUS

## 2017-08-06 MED ORDER — FENTANYL CITRATE (PF) 100 MCG/2ML IJ SOLN
25.0000 ug | INTRAMUSCULAR | Status: DC | PRN
  Administered 2017-08-07: 50 ug via INTRAVENOUS

## 2017-08-06 MED ORDER — KETOROLAC TROMETHAMINE 30 MG/ML IJ SOLN
30.0000 mg | Freq: Once | INTRAMUSCULAR | Status: AC
  Administered 2017-08-07: 30 mg via INTRAVENOUS

## 2017-08-06 NOTE — Discharge Instructions (Signed)
Radio Frequency Ablation Post Procedure Discharge Instructions ° °1. May resume a regular diet and any medications that you routinely take (including pain medications). °2. No driving day of procedure. °3. Upon discharge go home and rest for at least 4 hours.  May use an ice pack as needed to injection sites on back. °4. Remove bandades later, today. ° ° ° °Please contact our office at [phone] for the following symptoms: ° °· Fever greater than 100 degrees °· Increased swelling, pain, or redness at injection site. ° ° °Thank you for visiting [facility]. °

## 2017-08-07 ENCOUNTER — Ambulatory Visit
Admission: RE | Admit: 2017-08-07 | Discharge: 2017-08-07 | Disposition: A | Discharge status: Home / Self Care | Payer: BLUE CROSS/BLUE SHIELD | Source: Ambulatory Visit | Attending: Family Medicine | Admitting: Family Medicine

## 2017-08-07 ENCOUNTER — Other Ambulatory Visit: Payer: Self-pay | Admitting: Family Medicine

## 2017-08-07 DIAGNOSIS — M47816 Spondylosis without myelopathy or radiculopathy, lumbar region: Secondary | ICD-10-CM

## 2017-08-07 MED ORDER — METHYLPREDNISOLONE ACETATE 40 MG/ML INJ SUSP (RADIOLOG
160.0000 mg | Freq: Once | INTRAMUSCULAR | Status: AC
  Administered 2017-08-07: 160 mg via INTRALESIONAL

## 2017-08-13 ENCOUNTER — Other Ambulatory Visit: Payer: Self-pay | Admitting: Osteopathic Medicine

## 2017-08-16 DIAGNOSIS — F331 Major depressive disorder, recurrent, moderate: Secondary | ICD-10-CM | POA: Diagnosis not present

## 2017-08-16 DIAGNOSIS — F411 Generalized anxiety disorder: Secondary | ICD-10-CM | POA: Diagnosis not present

## 2017-08-19 DIAGNOSIS — M5416 Radiculopathy, lumbar region: Secondary | ICD-10-CM | POA: Diagnosis not present

## 2017-08-26 DIAGNOSIS — F411 Generalized anxiety disorder: Secondary | ICD-10-CM | POA: Diagnosis not present

## 2017-08-26 DIAGNOSIS — F331 Major depressive disorder, recurrent, moderate: Secondary | ICD-10-CM | POA: Diagnosis not present

## 2017-08-30 DIAGNOSIS — F331 Major depressive disorder, recurrent, moderate: Secondary | ICD-10-CM | POA: Diagnosis not present

## 2017-08-30 DIAGNOSIS — F411 Generalized anxiety disorder: Secondary | ICD-10-CM | POA: Diagnosis not present

## 2017-08-31 DIAGNOSIS — M545 Low back pain: Secondary | ICD-10-CM | POA: Diagnosis not present

## 2017-09-09 ENCOUNTER — Ambulatory Visit: Payer: BLUE CROSS/BLUE SHIELD | Admitting: Osteopathic Medicine

## 2017-09-10 ENCOUNTER — Other Ambulatory Visit: Payer: Self-pay | Admitting: Physician Assistant

## 2017-09-10 DIAGNOSIS — Z760 Encounter for issue of repeat prescription: Secondary | ICD-10-CM

## 2017-09-12 ENCOUNTER — Telehealth: Payer: Self-pay | Admitting: Osteopathic Medicine

## 2017-09-12 DIAGNOSIS — Z760 Encounter for issue of repeat prescription: Secondary | ICD-10-CM

## 2017-09-12 MED ORDER — DIAZEPAM 10 MG PO TABS: ORAL_TABLET | ORAL | 1 refills | Status: DC

## 2017-09-12 NOTE — Telephone Encounter (Signed)
Refill erx sent  

## 2017-11-29 ENCOUNTER — Ambulatory Visit (INDEPENDENT_AMBULATORY_CARE_PROVIDER_SITE_OTHER): Payer: BLUE CROSS/BLUE SHIELD | Admitting: Family Medicine

## 2017-11-29 ENCOUNTER — Encounter: Payer: Self-pay | Admitting: Family Medicine

## 2017-11-29 VITALS — BP 119/78 | HR 59 | Wt 177.0 lb

## 2017-11-29 DIAGNOSIS — J302 Other seasonal allergic rhinitis: Secondary | ICD-10-CM

## 2017-11-29 DIAGNOSIS — R6883 Chills (without fever): Secondary | ICD-10-CM

## 2017-11-29 DIAGNOSIS — R195 Other fecal abnormalities: Secondary | ICD-10-CM

## 2017-11-29 DIAGNOSIS — B338 Other specified viral diseases: Secondary | ICD-10-CM | POA: Diagnosis not present

## 2017-11-29 DIAGNOSIS — Z800 Family history of malignant neoplasm of digestive organs: Secondary | ICD-10-CM

## 2017-11-29 DIAGNOSIS — R5383 Other fatigue: Secondary | ICD-10-CM | POA: Diagnosis not present

## 2017-11-29 LAB — CBC WITH DIFFERENTIAL/PLATELET
Basophils Absolute: 22 cells/uL (ref 0–200)
Basophils Relative: 0.5 %
Eosinophils Absolute: 39 cells/uL (ref 15–500)
Eosinophils Relative: 0.9 %
HCT: 38.4 % (ref 35.0–45.0)
Hemoglobin: 13 g/dL (ref 11.7–15.5)
Lymphs Abs: 2206 cells/uL (ref 850–3900)
MCH: 30.7 pg (ref 27.0–33.0)
MCHC: 33.9 g/dL (ref 32.0–36.0)
MCV: 90.8 fL (ref 80.0–100.0)
MPV: 9.7 fL (ref 7.5–12.5)
Monocytes Relative: 8.4 %
Neutro Abs: 1673 cells/uL (ref 1500–7800)
Neutrophils Relative %: 38.9 %
Platelets: 283 10*3/uL (ref 140–400)
RBC: 4.23 10*6/uL (ref 3.80–5.10)
RDW: 12.2 % (ref 11.0–15.0)
Total Lymphocyte: 51.3 %
WBC mixed population: 361 cells/uL (ref 200–950)
WBC: 4.3 10*3/uL (ref 3.8–10.8)

## 2017-11-29 LAB — COMPLETE METABOLIC PANEL WITH GFR
AG Ratio: 2 (calc) (ref 1.0–2.5)
ALT: 13 U/L (ref 6–29)
AST: 19 U/L (ref 10–35)
Albumin: 4.3 g/dL (ref 3.6–5.1)
Alkaline phosphatase (APISO): 50 U/L (ref 33–130)
BUN: 11 mg/dL (ref 7–25)
CO2: 29 mmol/L (ref 20–32)
Calcium: 9.3 mg/dL (ref 8.6–10.4)
Chloride: 104 mmol/L (ref 98–110)
Creat: 0.78 mg/dL (ref 0.50–1.05)
GFR, Est African American: 101 mL/min/{1.73_m2} (ref >=60)
GFR, Est Non African American: 87 mL/min/{1.73_m2} (ref >=60)
Globulin: 2.2 g/dL (calc) (ref 1.9–3.7)
Glucose, Bld: 87 mg/dL (ref 65–99)
Potassium: 4.2 mmol/L (ref 3.5–5.3)
Sodium: 136 mmol/L (ref 135–146)
Total Bilirubin: 0.3 mg/dL (ref 0.2–1.2)
Total Protein: 6.5 g/dL (ref 6.1–8.1)

## 2017-11-29 LAB — LIPASE: Lipase: 20 U/L (ref 7–60)

## 2017-11-29 LAB — GAMMA GT: GGT: 11 U/L (ref 3–70)

## 2017-11-29 LAB — AMYLASE: Amylase: 53 U/L (ref 21–101)

## 2017-11-29 NOTE — Progress Notes (Signed)
Renee Norris is a 52 y.o. female who [facility] today for fatigue and pale stools.  Inetta Fermoina notes a several day history of fatigue with decreased energy associated with occasional chills.  She notes this is associate with a few day history of pale or light colored stools.  Her father had pancreatic cancer and she is concerned that she may be having pancreatic cancer or some other serious etiology.  She denies abdominal pain vomiting or diarrhea or significant weight loss.  Additionally she notes a few day history of itchy watery eyes sneezing and cough.  She suspects this may be due to seasonal allergies but is worried about taking medications and is concerned about potential interference with her chronic medications.   Past Medical History:  Diagnosis Date  . Chronic back pain 01/30/2016   folowing with Ortho for Tramadol, we are managing benzo therapy, started TCA as well, refer to PT, [facility]   . Depression 01/30/2016   TCA to help this as well as pain/sleep issues, refer for CBT   . Insomnia 01/30/2016   TCA, do not take with benzos   . Postmenopausal 01/30/2016   HRT from OBGYN    Past Surgical History:  Procedure Laterality Date  . ABDOMINAL SURGERY    . AUGMENTATION MAMMAPLASTY    . BREAST SURGERY    . GYN surgery    . TONSILLECTOMY     Social History   Tobacco Use  . Smoking status: Former Games developermoker  . Smokeless tobacco: Never Used  Substance Use Topics  . Alcohol use: No    Comment: 2-3 glasses of wine a day   family history includes Cancer in her father and mother; Diabetes in her father and mother; Hypertension in her father and mother.  ROS as above:  Medications: Current Outpatient Medications  Medication Sig Dispense Refill  . AMBULATORY NON FORMULARY MEDICATION Medical massage for pain. Visits per therapist  discretion for 6 weeks as needed.  Use modalities as needed 1 each 0  . BLACK PEPPER-TURMERIC PO Take by mouth.    . diazepam (VALIUM) 10 MG tablet TAKE 1/2-1 TABLET BY MOUTH EVERY DAY AS NEEDED FOR SEVERE SPASMS 30 tablet 1  . lamoTRIgine (LAMICTAL) 100 MG tablet TAKE 1 TABLET BY MOUTH EVERY DAY IN THE MORNING  0  . Omega-3 Fatty Acids (OMEGA 3 PO) Take by mouth.    . topiramate (TOPAMAX) 25 MG tablet 1TAB NIGHTLY X14 DAYS THNE 2TABS NIGHTLY THEREAFTER 60 tablet 2  . traMADol (ULTRAM) 50 MG tablet TAKE 1 TABLET BY MOUTH EVERY 12 HOURS 50 tablet 2   No current facility-administered medications for this visit.    No Known Allergies  Health Maintenance Health Maintenance  Topic Date Due  . Hepatitis C Screening  08/20/2018 (Originally 07/07/1966)  . HIV Screening  08/20/2018 (Originally 05/15/1979)  . INFLUENZA VACCINE  03/20/2018  . MAMMOGRAM  06/06/2019  . PAP SMEAR  08/24/2019  . TETANUS/TDAP  01/04/2025  . COLONOSCOPY  02/02/2025     Exam:  BP 119/78   Pulse (!) 59   Wt 177 lb (80.3 kg)   LMP 04/21/2014 (Approximate)   BMI 29.91 kg/m  Gen: Well NAD HEENT: EOMI,  MMM no significant jaundice or scleral icterus present.  No conjunctival injection.  Clear nasal discharge with inflamed nasal turbinates.  Normal tympanic membranes.  No cervical lymphadenopathy. Lungs: Normal work of breathing.  CTABL Heart: RRR no MRG Abd: NABS, Soft. Nondistended, Nontender no ascites or fluid wave palpated.  No abdominal masses palpated Exts: Brisk capillary refill, warm and well perfused.    CT scan abdomen and pelvis from January 2014 images and report independently reviewed.   Assessment and Plan: 52 y.o. female with  Fatigue associated with some systemic symptoms with the setting of pale stools.  Fortunately patient is not jaundice.  I do think it is reasonable to proceed with a bit of a lab workup today.  CBC metabolic panel lipase and amylase and GGT should help evaluate for etiologies  such as gallstones or biliary tract issues or pancreatic cancer.  If labs are abnormal or if patient has failure to improve in the near future next step should be CT scan of abdomen pelvis plus or minus ultrasound.  Patient additionally has a seasonal allergy symptoms.  Recommend using Zyrtec and Flonase as this should be safe with her current medication list.   Orders Placed This Encounter  Procedures  . CBC with Differential/Platelet  . COMPLETE METABOLIC PANEL WITH GFR  . Lipase  . Amylase  . Gamma GT   No orders of the defined types were placed in this encounter.    Discussed warning signs or symptoms. Please see discharge instructions. Patient expresses understanding.

## 2017-11-29 NOTE — Patient Instructions (Signed)
Thank you for coming in today. Continue over the counter allergy medicine like zyrtec and flonase.  Get the generic version.  Use albuterol as needed.   Get labs now and we will start workup for pale stool.  If anything is off we will do a CT scan.  If not better we will do a CT scan or ultrasound or both.   Allergic Rhinitis, Adult Allergic rhinitis is an allergic reaction that affects the mucous membrane inside the nose. It causes sneezing, a runny or stuffy nose, and the feeling of mucus going down the back of the throat (postnasal drip). Allergic rhinitis can be mild to severe. There are two types of allergic rhinitis:  Seasonal. This type is also called hay fever. It happens only during certain seasons.  Perennial. This type can happen at any time of the year.  What are the causes? This condition happens when the body's defense system (immune system) responds to certain harmless substances called allergens as though they were germs.  Seasonal allergic rhinitis is triggered by pollen, which can come from grasses, trees, and weeds. Perennial allergic rhinitis may be caused by:  House dust mites.  Pet dander.  Mold spores.  What are the signs or symptoms? Symptoms of this condition include:  Sneezing.  Runny or stuffy nose (nasal congestion).  Postnasal drip.  Itchy nose.  Tearing of the eyes.  Trouble sleeping.  Daytime sleepiness.  How is this diagnosed? This condition may be diagnosed based on:  Your medical history.  A physical exam.  Tests to check for related conditions, such as: ? Asthma. ? Pink eye. ? Ear infection. ? Upper respiratory infection.  Tests to find out which allergens trigger your symptoms. These may include skin or blood tests.  How is this treated? There is no cure for this condition, but treatment can help control symptoms. Treatment may include:  Taking medicines that block allergy symptoms, such as antihistamines. Medicine may  be given as a shot, nasal spray, or pill.  Avoiding the allergen.  Desensitization. This treatment involves getting ongoing shots until your body becomes less sensitive to the allergen. This treatment may be done if other treatments do not help.  If taking medicine and avoiding the allergen does not work, new, stronger medicines may be prescribed.  Follow these instructions at home:  Find out what you are allergic to. Common allergens include smoke, dust, and pollen.  Avoid the things you are allergic to. These are some things you can do to help avoid allergens: ? Replace carpet with wood, tile, or vinyl flooring. Carpet can trap dander and dust. ? Do not smoke. Do not allow smoking in your home. ? Change your heating and air conditioning filter at least once a month. ? During allergy season:  Keep windows closed as much as possible.  Plan outdoor activities when pollen counts are lowest. This is usually during the evening hours.  When coming indoors, change clothing and shower before sitting on furniture or bedding.  Take over-the-counter and prescription medicines only as told by your health care provider.  Keep all follow-up visits as told by your health care provider. This is important. Contact a health care provider if:  You have a fever.  You develop a persistent cough.  You make whistling sounds when you breathe (you wheeze).  Your symptoms interfere with your normal daily activities. Get help right away if:  You have shortness of breath. Summary  This condition can be managed by taking  medicines as directed and avoiding allergens.  Contact your health care provider if you develop a persistent cough or fever.  During allergy season, keep windows closed as much as possible. This information is not intended to replace advice given to you by your health care provider. Make sure you discuss any questions you have with your health care provider. Document Released:  05/01/2001 Document Revised: 09/13/2016 Document Reviewed: 09/13/2016 Elsevier Interactive Patient Education  [employer].

## 2017-11-30 ENCOUNTER — Other Ambulatory Visit: Payer: Self-pay | Admitting: Osteopathic Medicine

## 2017-11-30 DIAGNOSIS — Z760 Encounter for issue of repeat prescription: Secondary | ICD-10-CM

## 2017-12-02 NOTE — Telephone Encounter (Signed)
[facility] requesting med refill for diazepam.  

## 2017-12-03 NOTE — Telephone Encounter (Signed)
Pt has been updated.  

## 2017-12-09 ENCOUNTER — Other Ambulatory Visit: Payer: Self-pay | Admitting: Osteopathic Medicine

## 2017-12-09 NOTE — Telephone Encounter (Signed)
[facility] requesting med refill for tramadol. Thanks.

## 2017-12-10 NOTE — Telephone Encounter (Signed)
Left a detailed msg regarding med refill request [facility] to [facility]. Call back information provided.

## 2017-12-19 ENCOUNTER — Encounter: Payer: Self-pay | Admitting: Osteopathic Medicine

## 2017-12-19 DIAGNOSIS — F411 Generalized anxiety disorder: Secondary | ICD-10-CM | POA: Diagnosis not present

## 2017-12-19 DIAGNOSIS — F331 Major depressive disorder, recurrent, moderate: Secondary | ICD-10-CM | POA: Diagnosis not present

## 2017-12-20 ENCOUNTER — Telehealth: Payer: Self-pay

## 2017-12-20 MED ORDER — BUPROPION HCL ER (XL) 150 MG PO TB24: 150.0000 mg | ORAL_TABLET | ORAL | 0 refills | Status: DC

## 2017-12-20 NOTE — Telephone Encounter (Signed)
Pt left a vm msg requesting a rx for wellbutrin xl 150 mg. As per pt, she is ready to start a medication. No other details given to why she wanted to start medication. Attempted to contact pt, no answer. Unable to leave a msg, voicemail was "full".

## 2017-12-20 NOTE — Telephone Encounter (Signed)
I already responded to this on MyChart

## 2018-02-10 ENCOUNTER — Emergency Department (INDEPENDENT_AMBULATORY_CARE_PROVIDER_SITE_OTHER)
Admission: EM | Admit: 2018-02-10 | Discharge: 2018-02-10 | Disposition: A | Discharge status: Home / Self Care | Payer: BLUE CROSS/BLUE SHIELD | Source: Home / Self Care | Attending: Family Medicine | Admitting: Family Medicine

## 2018-02-10 ENCOUNTER — Other Ambulatory Visit: Payer: Self-pay

## 2018-02-10 ENCOUNTER — Encounter: Payer: Self-pay | Admitting: Emergency Medicine

## 2018-02-10 DIAGNOSIS — R319 Hematuria, unspecified: Secondary | ICD-10-CM | POA: Diagnosis not present

## 2018-02-10 DIAGNOSIS — M545 Low back pain, unspecified: Secondary | ICD-10-CM

## 2018-02-10 LAB — POCT URINALYSIS DIP (MANUAL ENTRY)
Bilirubin, UA: NEGATIVE
Glucose, UA: NEGATIVE mg/dL
Ketones, POC UA: NEGATIVE mg/dL
Nitrite, UA: NEGATIVE
Protein Ur, POC: NEGATIVE mg/dL
Spec Grav, UA: <=1.005 — AB (ref 1.010–1.025)
Urobilinogen, UA: 0.2 E.U./dL
pH, UA: 6 (ref 5.0–8.0)

## 2018-02-10 MED ORDER — CYCLOBENZAPRINE HCL 10 MG PO TABS: 10.0000 mg | ORAL_TABLET | Freq: Two times a day (BID) | ORAL | 0 refills | Status: DC | PRN

## 2018-02-10 MED ORDER — MELOXICAM 7.5 MG PO TABS: 7.5000 mg | ORAL_TABLET | Freq: Every day | ORAL | 0 refills | Status: DC

## 2018-02-10 NOTE — ED Triage Notes (Signed)
Low right back pain since last night

## 2018-02-10 NOTE — Discharge Instructions (Signed)
°  Flexeril is a muscle relaxer and may cause drowsiness. Do not drink alcohol, drive, or operate heavy machinery while taking.  Meloxicam (Mobic) is an antiinflammatory to help with pain and inflammation.  Do not take ibuprofen, Advil, Aleve, or any other medications that contain NSAIDs while taking meloxicam as this may cause stomach upset or even ulcers if taken in large amounts for an extended period of time.   Please follow up with family medicine or sports medicine later this week if not improving.

## 2018-02-10 NOTE — ED Provider Notes (Signed)
[name]    CSN: [id number] Arrival date & time: 02/10/18  1926     History   Chief Complaint Chief Complaint  Patient presents with  . Back Pain    HPI Renee Norris is a 52 y.o. female.   HPI  Renee Norris is a 52 y.o. female presenting to UC with c/o Right sided mid to lower back pain that is radiating into her Right flank and Right side of abdomen.  Certain movements or positions make it worse. Hx of chronic back pain due to DDD but states this pain feels different and worse. Pain is cramping and sharp, moderate to severe at times. She did do "side planks" yesterday before pain started last night but did not feel any pain while doing them.  Denies radiation of pain or numbness in arms or legs.  She has taken Tramadol and motrin with minimal relief.  Denies fever or chills. No hx of kidney stones. No urinary symptoms.   Past Medical History:  Diagnosis Date  . Chronic back pain 01/30/2016   folowing with Ortho for Tramadol, we are managing benzo therapy, started TCA as well, refer to PT, refer to [facility] for CBT   . Depression 01/30/2016   TCA to help this as well as pain/sleep issues, refer for CBT   . Insomnia 01/30/2016   TCA, do not take with benzos   . Postmenopausal 01/30/2016   HRT from OBGYN     Patient Active Problem List   Diagnosis Date Noted  . 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  . 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 Sinus bradycardia, chronic 04/04/2016  . Lumbar spinal stenosis 03/29/2016  . H/O mammogram 02/14/2016  . History of bradycardia 02/14/2016  . Chronic back pain 01/30/2016  . Depression 01/30/2016  . Insomnia 01/30/2016  . Cephalalgia 01/30/2016  . Medication management 01/30/2016  . Other fatigue 01/30/2016  . Postmenopausal 01/30/2016    Past Surgical History:  Procedure Laterality Date  . ABDOMINAL SURGERY    . AUGMENTATION MAMMAPLASTY    . BREAST SURGERY    .  GYN surgery    . TONSILLECTOMY      OB History   None      Home Medications    Prior to Admission medications   Medication Sig Start Date End Date Taking? Authorizing Provider  BLACK PEPPER-TURMERIC PO Take by mouth.    [provider]  buPROPion (WELLBUTRIN XL) 150 MG 24 hr tablet Take 1 tablet (150 mg total) by mouth every morning. 12/20/17   Sunnie Nielsen, DO  cyclobenzaprine (FLEXERIL) 10 MG tablet Take 1 tablet (10 mg total) by mouth 2 (two) times daily as needed. 02/10/18   Lurene Shadow, PA-C  lamoTRIgine (LAMICTAL) 100 MG tablet TAKE 1 TABLET BY MOUTH EVERY DAY IN THE MORNING 05/01/17   [provider]  meloxicam (MOBIC) 7.5 MG tablet Take 1-2 tablets (7.5-15 mg total) by mouth daily. 02/10/18   Lurene Shadow, PA-C  Omega-3 Fatty Acids (OMEGA 3 PO) Take by mouth.    [provider]  traMADol (ULTRAM) 50 MG tablet TAKE 1 TABLET BY MOUTH EVERY 12 HOURS 12/10/17   Sunnie Nielsen, DO    Family History Family History  Problem Relation Age of Onset  . Diabetes Mother   . Hypertension Mother   . Cancer Mother   . Diabetes Father   . Hypertension Father   . Cancer Father  Social History Social History   Tobacco Use  . Smoking status: Former Games developermoker  . Smokeless tobacco: Never Used  Substance Use Topics  . Alcohol use: No    Comment: 2-3 glasses of wine a day  . Drug use: No     Allergies   Patient has no known allergies.   Review of Systems Review of Systems  Constitutional: Negative for chills and fever.  Gastrointestinal: Positive for abdominal pain. Negative for diarrhea, nausea and vomiting.  Genitourinary: Positive for flank pain (Right). Negative for dysuria, frequency, hematuria, pelvic pain and urgency.  Musculoskeletal: Positive for back pain and myalgias.  Skin: Negative for rash.     Physical Exam Triage Vital Signs ED Triage Vitals  Enc Vitals Group     BP 02/10/18 1943 122/80     Pulse Rate 02/10/18 1943  (!) 57     Resp --      Temp 02/10/18 1943 98.2 F (36.8 C)     Temp Source 02/10/18 1943 Oral     SpO2 02/10/18 1943 100 %     Weight 02/10/18 1944 178 lb (80.7 kg)     Height 02/10/18 1944 5\' 4"  (1.626 m)     Head Circumference --      Peak Flow --      Pain Score 02/10/18 1944 8     Pain Loc --      Pain Edu? --      Excl. in GC? --    No data found.  Updated Vital Signs BP 122/80 (BP Location: Right Arm)   Pulse (!) 57   Temp 98.2 F (36.8 C) (Oral)   Ht 5\' 4"  (1.626 m)   Wt 178 lb (80.7 kg)   LMP 04/21/2014 (Approximate)   SpO2 100%   BMI 30.55 kg/m   Visual Acuity Right Eye Distance:   Left Eye Distance:   Bilateral Distance:    Right Eye Near:   Left Eye Near:    Bilateral Near:     Physical Exam  Constitutional: She is oriented to person, place, and time. She appears well-developed and well-nourished. No distress.  HENT:  Head: Normocephalic and atraumatic.  Eyes: EOM are normal.  Neck: Normal range of motion.  Cardiovascular: Normal rate.  Pulmonary/Chest: Effort normal.  Musculoskeletal: Normal range of motion. She exhibits tenderness. She exhibits no edema.  No midline spinal tenderness. Tenderness to Right side lower thoracic and lumbar muscles. Full ROM upper and lower extremities. Negative straight leg raise  Neurological: She is alert and oriented to person, place, and time.  Skin: Skin is warm and dry. No rash noted. She is not diaphoretic. No erythema.  Psychiatric: She has a normal mood and affect. Her behavior is normal.  Nursing note and vitals reviewed.    UC Treatments / Results  Labs (all labs ordered are listed, but only abnormal results are displayed) Labs Reviewed  POCT URINALYSIS DIP (MANUAL ENTRY) - Abnormal; Notable for the following components:      Result Value   Spec Grav, UA <=1.005 (*)    Blood, UA trace-lysed (*)    Leukocytes, UA Small (1+) (*)    All other components within normal limits  URINE CULTURE     EKG None  Radiology No results found.  Procedures Procedures (including critical care time)  Medications Ordered in UC Medications - No data to display  Initial Impression / Assessment and Plan / UC Course  I have reviewed the triage vital signs and  the nursing notes.  Pertinent labs & imaging results that were available during my care of the patient were reviewed by me and considered in my medical decision making (see chart for details).     Hx and exam c/w muscle strain Will encouraged symptomatic treatment  Pt info packet provided.  Final Clinical Impressions(s) / UC Diagnoses   Final diagnoses:  Hematuria, unspecified type  Acute right-sided low back pain without sciatica     Discharge Instructions      Flexeril is a muscle relaxer and may cause drowsiness. Do not drink alcohol, drive, or operate heavy machinery while taking.  Meloxicam (Mobic) is an antiinflammatory to help with pain and inflammation.  Do not take ibuprofen, Advil, Aleve, or any other medications that contain NSAIDs while taking meloxicam as this may cause stomach upset or even ulcers if taken in large amounts for an extended period of time.   Please follow up with family medicine or sports medicine later this week if not improving.    ED Prescriptions    Medication Sig Dispense Auth. Provider   cyclobenzaprine (FLEXERIL) 10 MG tablet Take 1 tablet (10 mg total) by mouth 2 (two) times daily as needed. 20 tablet Waylan Rocher O, PA-C   meloxicam (MOBIC) 7.5 MG tablet Take 1-2 tablets (7.5-15 mg total) by mouth daily. 20 tablet Lurene Shadow, PA-C     Controlled Substance Prescriptions [place] Controlled Substance Registry consulted? Not Applicable   Rolla Plate 02/11/18 9604

## 2018-02-12 ENCOUNTER — Telehealth: Payer: Self-pay | Admitting: Emergency Medicine

## 2018-02-12 LAB — URINE CULTURE
MICRO NUMBER:: 90752718
SPECIMEN QUALITY:: ADEQUATE

## 2018-02-13 DIAGNOSIS — L570 Actinic keratosis: Secondary | ICD-10-CM | POA: Diagnosis not present

## 2018-02-13 DIAGNOSIS — D485 Neoplasm of uncertain behavior of skin: Secondary | ICD-10-CM | POA: Diagnosis not present

## 2018-02-15 ENCOUNTER — Other Ambulatory Visit: Payer: Self-pay | Admitting: Osteopathic Medicine

## 2018-03-20 ENCOUNTER — Encounter: Payer: Self-pay | Admitting: Osteopathic Medicine

## 2018-03-20 ENCOUNTER — Ambulatory Visit (INDEPENDENT_AMBULATORY_CARE_PROVIDER_SITE_OTHER): Payer: Managed Care, Other (non HMO) | Admitting: Osteopathic Medicine

## 2018-03-20 VITALS — BP 109/67 | HR 55 | Temp 98.2°F | Wt 169.9 lb

## 2018-03-20 DIAGNOSIS — Z780 Asymptomatic menopausal state: Secondary | ICD-10-CM | POA: Diagnosis not present

## 2018-03-20 DIAGNOSIS — M48061 Spinal stenosis, lumbar region without neurogenic claudication: Secondary | ICD-10-CM | POA: Diagnosis not present

## 2018-03-20 DIAGNOSIS — F331 Major depressive disorder, recurrent, moderate: Secondary | ICD-10-CM

## 2018-03-20 DIAGNOSIS — M545 Low back pain, unspecified: Secondary | ICD-10-CM

## 2018-03-20 DIAGNOSIS — G8929 Other chronic pain: Secondary | ICD-10-CM

## 2018-03-20 MED ORDER — VENLAFAXINE HCL ER 37.5 MG PO CP24: 37.5000 mg | ORAL_CAPSULE | Freq: Every day | ORAL | 1 refills | Status: DC

## 2018-03-20 NOTE — Progress Notes (Signed)
HPI: Renee Norris is a 52 y.o. female who  has a past medical history of Chronic back pain (01/30/2016), Depression (01/30/2016), Insomnia (01/30/2016), and Postmenopausal (01/30/2016).  she presents to [facility] today, 03/20/18,  for chief complaint of:  Medication management - concerns about moods  Patient here today after having been off of her psychiatric medicines for about 3 weeks, noticing no difference in mood, depression is about the same.  She just wanted to try coming off of medicines to see how she was doing.  Had noticed more mood swings irritability and depression since going through menopause.  She is seeing integrative health, they are managing thyroid medicines, estrogen, progesterone.  It has been about a year or so since she has seen her psychiatrist.  She has been on multiple medication regimens in the past for depression which has had variable efficacy.  She cannot remember the names of all of the medication regimens  Back pain a bit worse than usual. Using tramadol consistently. Rarely needing to use the Valium for additional muscle spasm.  Previously on Cymbalta but this was not really helpful for her.    Past medical history, surgical history, and family history reviewed.  Current medication list and allergy/intolerance information reviewed.   (See remainder of HPI, ROS, Phys Exam below)    ASSESSMENT/PLAN:   Moderate episode of recurrent major depressive disorder (HCC)  Chronic bilateral low back pain without sciatica  Spinal stenosis of lumbar region, unspecified whether neurogenic claudication present  Post-menopausal   Meds ordered this encounter  Medications  . venlafaxine XR (EFFEXOR-XR) 37.5 MG 24 hr capsule    Sig: Take 1 capsule (37.5 mg total) by mouth daily with breakfast.    Dispense:  30 capsule    Refill:  1    Patient Instructions  Call [facility]: [phone] Assistance with medication management for  moods Have them send [place] records: [phone]   Follow-up plan: Return in about 1 month (around 04/17/2018) for recheck on Effexor, see me or call/message sooner if needed! .     ############################################ ############################################ ############################################ ############################################  Medications reviewed but list needs to be confirmed with pharmacy, she is not sure the dose she is taking for some of her hormonal medications.   No Known Allergies    Review of Systems:  Constitutional: No recent illness  HEENT: No  headache, no vision change  Cardiac: No  chest pain, No  pressure, No palpitations  Respiratory:  No  shortness of breath. No  Cough  Gastrointestinal: No  abdominal pain, no change on bowel habits  Musculoskeletal: No new myalgia/arthralgia, + recent exacerbation of chronic back pain  Neurologic: No  weakness, No  Dizziness  Psychiatric: +concerns with depression, +concerns with anxiety  Exam:  BP 109/67 (BP Location: Left Arm, Patient Position: Sitting, Cuff Size: Normal)   Pulse (!) 55   Temp 98.2 F (36.8 C) (Oral)   Wt 169 lb 14.4 oz (77.1 kg)   LMP 04/21/2014 (Approximate)   BMI 29.16 kg/m   Constitutional: VS see above. General Appearance: alert, well-developed, well-nourished, NAD  Eyes: Normal lids and conjunctive, non-icteric sclera  Ears, Nose, Mouth, Throat: MMM, Normal external inspection ears/nares/mouth/lips/gums.  Neck: No masses, trachea midline.   Respiratory: Normal respiratory effort. no wheeze, no rhonchi, no rales  Cardiovascular: S1/S2 normal, no murmur, no rub/gallop auscultated. RRR.   Musculoskeletal: Gait normal. Symmetric and independent movement of all extremities  Neurological: Normal balance/coordination. No tremor.  Skin: warm, dry,  intact.   Psychiatric: Normal judgment/insight. Normal mood and affect. Oriented x3.   Visit summary with  medication list and pertinent instructions was printed for patient to review, advised to alert us if any changes needed. All questions at time of visit were answered - patient instructed to contact office with any additional concerns. ER/RTC precautions were reviewed with the patient and understanding verbalized.   Follow-up plan: Return in about 1 month (around 04/17/2018) for recheck on Effexor, see me or call/message sooner if needed! .  Note: Total time spent 25 minutes, greater than 50% of the visit was spent face-to-face counseling and coordinating care for the following: The primary encounter diagnosis was Moderate episode of recurrent major depressive disorder (HCC). Diagnoses of Chronic bilateral low back pain without sciatica, Spinal stenosis of lumbar region, unspecified whether neurogenic claudication present, and Post-menopausal were also pertinent to this visit.Marland Kitchen.  Please note: voice recognition software was used to produce this document, and typos may escape review. Please contact Dr. Lyn HollingsheadAlexander for any needed clarifications.

## 2018-03-20 NOTE — Patient Instructions (Signed)
Call [facility]: [phone] Assistance with medication management for moods Have them send us records: [phone]

## 2018-03-24 ENCOUNTER — Telehealth: Payer: Self-pay | Admitting: Osteopathic Medicine

## 2018-03-24 NOTE — Telephone Encounter (Signed)
I believe the fax was received of current Rx's. Routing to verify.

## 2018-03-24 NOTE — Telephone Encounter (Signed)
-----   Message from Sunnie NielsenNatalie Alexander, DO sent at 03/20/2018  9:21 AM EDT ----- Regarding: Call pharmacy to confirm med list Can we call pharmacy to confirm her medication list?  If they can just access whatever she is filled in the past couple months or a year, that would be fine.  She is not sure what her dose of estrogen/progesterone her thyroid medicine is.  Thanks!

## 2018-03-30 ENCOUNTER — Other Ambulatory Visit: Payer: Self-pay | Admitting: Osteopathic Medicine

## 2018-04-03 NOTE — Telephone Encounter (Signed)
I do not recall reviewing this fax, can we please request it from pharmacy again?  Thank you

## 2018-04-04 NOTE — Telephone Encounter (Signed)
List of pt's current active med list from [facility] & pharmacy placed in provider's box.

## 2018-04-04 NOTE — Telephone Encounter (Signed)
Call [facility]: [phone] for assistance with medication management. As per Myriam JacobsonHelen, will send/fax over.

## 2018-04-04 NOTE — Telephone Encounter (Signed)
Spoke with pharmacy, they will send over.

## 2018-04-04 NOTE — Telephone Encounter (Signed)
FYI

## 2018-04-09 NOTE — Telephone Encounter (Signed)
Updated list in Epic to reflect psych notes Can we confirm if she is still on Effexor?

## 2018-04-10 NOTE — Telephone Encounter (Signed)
Called patient and she is not taking the Effexor anymore not the Lamictal.  SHe is not at home and can not remember what she is on but will call back later to let you know. KG LPN

## 2018-04-10 NOTE — Addendum Note (Signed)
Addended by: Deirdre PippinsALEXANDER, Kim Lauver M on: 04/10/2018 11:30 AM   Modules accepted: Orders

## 2018-04-10 NOTE — Telephone Encounter (Signed)
List up dated.

## 2018-04-11 ENCOUNTER — Telehealth: Payer: Self-pay

## 2018-04-11 NOTE — Telephone Encounter (Signed)
Pt called - she wanted provider to be aware she is currently taking venlafaxine XR 37.5 mg. Pt can be contacted at [phone], if needed.

## 2018-04-12 MED ORDER — VENLAFAXINE HCL ER 37.5 MG PO CP24: 37.5000 mg | ORAL_CAPSULE | Freq: Every day | ORAL | 3 refills | Status: DC

## 2018-04-12 NOTE — Telephone Encounter (Signed)
OK. Added to list

## 2018-04-13 ENCOUNTER — Encounter: Payer: Self-pay | Admitting: Osteopathic Medicine

## 2018-04-14 MED ORDER — PROGESTERONE MICRONIZED 100 MG PO CAPS: 100.0000 mg | ORAL_CAPSULE | Freq: Every day | ORAL | 1 refills | Status: DC

## 2018-04-14 MED ORDER — VENLAFAXINE HCL ER 75 MG PO CP24: 75.0000 mg | ORAL_CAPSULE | Freq: Every day | ORAL | 1 refills | Status: DC

## 2018-04-22 ENCOUNTER — Encounter: Payer: Self-pay | Admitting: Osteopathic Medicine

## 2018-04-22 ENCOUNTER — Ambulatory Visit (INDEPENDENT_AMBULATORY_CARE_PROVIDER_SITE_OTHER): Payer: Managed Care, Other (non HMO) | Admitting: Osteopathic Medicine

## 2018-04-22 VITALS — BP 122/74 | HR 54 | Temp 98.2°F | Wt 170.9 lb

## 2018-04-22 DIAGNOSIS — Z780 Asymptomatic menopausal state: Secondary | ICD-10-CM | POA: Diagnosis not present

## 2018-04-22 DIAGNOSIS — M545 Low back pain, unspecified: Secondary | ICD-10-CM

## 2018-04-22 DIAGNOSIS — Z23 Encounter for immunization: Secondary | ICD-10-CM

## 2018-04-22 DIAGNOSIS — F331 Major depressive disorder, recurrent, moderate: Secondary | ICD-10-CM | POA: Diagnosis not present

## 2018-04-22 DIAGNOSIS — G8929 Other chronic pain: Secondary | ICD-10-CM

## 2018-04-22 DIAGNOSIS — E039 Hypothyroidism, unspecified: Secondary | ICD-10-CM

## 2018-04-22 DIAGNOSIS — Z0000 Encounter for general adult medical examination without abnormal findings: Secondary | ICD-10-CM

## 2018-04-22 MED ORDER — ESTRADIOL 0.5 MG PO TABS: 0.5000 mg | ORAL_TABLET | Freq: Every day | ORAL | 3 refills | Status: DC

## 2018-04-22 NOTE — Progress Notes (Signed)
HPI: Renee Norris is a 52 y.o. female who  has a past medical history of Chronic back pain (01/30/2016), Depression (01/30/2016), Insomnia (01/30/2016), and Postmenopausal (01/30/2016).  she presents to [facility] today, 04/22/18,  for chief complaint of:  Medication follow-up   Seen about a month ago, concern for mediction management for moods/depression/irritbaility/anxiety - at time of that visit, she had been off medications for 3 weeks or so. About a year since seen her psychiatrist.   We restarted Effexor 37.5 mg daily and she is here to follow-up on this. She reported some improvement (see email note). She is not on Wellbutrin. She is feeling much better back on the Effexor.   Pt was advised at last visit to contact her psychiatrist office to set up an appointment, given her multiple other medication regimens in the past and difficulty finding effective meds. She hasn't contacted them since she has felt okay.   [facility] was managing her hormones replacement, we refilled these, as well.     Past medical history, surgical history, and family history reviewed.  Current medication list and allergy/intolerance information reviewed.   (See remainder of HPI, ROS, Phys Exam below)     ASSESSMENT/PLAN:   Moderate episode of recurrent major depressive disorder (HCC) - doing well.   Labs ordered for future visit, other diagnoses not billed today.   Need for influenza vaccination - Plan: Flu Vaccine QUAD 6+ mos PF IM (Fluarix Quad PF)     Orders Placed This Encounter  Procedures  . Flu Vaccine QUAD 6+ mos PF IM (Fluarix Quad PF)  . CBC  . COMPLETE METABOLIC PANEL WITH GFR  . Lipid panel  . TSH     Follow-up plan: Return for annual physical sometime next few months  .     ############################################ ############################################ ############################################ ############################################    Outpatient Encounter Medications as of 04/22/2018  Medication Sig  . ARMOUR THYROID 30 MG tablet TAKE ONE TABLET EVERY MORNING 30 MINUTES BEFORE BREAKFAST  . buPROPion (WELLBUTRIN XL) 150 MG 24 hr tablet Take 150 mg by mouth daily.  . diazepam (VALIUM) 10 MG tablet Take 5-10 mg by mouth daily as needed for anxiety.  . Estradiol (ESTROGEL TD)   . progesterone (PROMETRIUM) 100 MG capsule Take 1-2 capsules (100-200 mg total) by mouth daily.  . traMADol (ULTRAM) 50 MG tablet TAKE 1 TABLET BY MOUTH EVERY 12 HOURS  . venlafaxine XR (EFFEXOR-XR) 75 MG 24 hr capsule Take 1 capsule (75 mg total) by mouth daily with breakfast.   No facility-administered encounter medications on file as of 04/22/2018.    No Known Allergies    Review of Systems:  Constitutional: No recent illness  HEENT: No  headache, no vision change  Cardiac: No  chest pain, No  pressure  Psychiatric: No  concerns with depression, No  concerns with anxiety  Exam:  BP 122/74 (BP Location: Left Arm, Patient Position: Sitting, Cuff Size: Normal)   Pulse (!) 54   Temp 98.2 F (36.8 C) (Oral)   Wt 170 lb 14.4 oz (77.5 kg)   LMP 04/21/2014 (Approximate)   BMI 29.33 kg/m   Constitutional: VS see above. General Appearance: alert, well-developed, well-nourished, NAD  Eyes: Normal lids and conjunctive, non-icteric sclera  Ears, Nose, Mouth, Throat: MMM, Normal external inspection ears/nares/mouth/lips/gums.  Neck: No masses, trachea midline.   Respiratory: Normal respiratory effort. no wheeze, no rhonchi, no rales  Cardiovascular: S1/S2 normal, no murmur, no rub/gallop auscultated. RRR.  Musculoskeletal: Gait normal. Symmetric and independent movement of all extremities  Neurological: Normal balance/coordination. No  tremor.  Skin: warm, dry, intact.   Psychiatric: Normal judgment/insight. Normal mood and affect. Oriented x3.   Visit summary with medication list and pertinent instructions was printed for patient to review, advised to alert [place] if any changes needed. All questions at time of visit were answered - patient instructed to contact office with any additional concerns. ER/RTC precautions were reviewed with the patient and understanding verbalized.   Follow-up plan: Return for annual physical sometime next few months .  Note: Total time spent 15 minutes, greater than 50% of the visit was spent face-to-face counseling and coordinating care for the following: The primary encounter diagnosis was Moderate episode of recurrent major depressive disorder (HCC). Diagnoses of Post-menopausal, Chronic midline low back pain without sciatica, Annual physical exam, Hypothyroidism, unspecified type, and Need for influenza vaccination were also pertinent to this visit.Marland Kitchen  Please note: voice recognition software was used to produce this document, and typos may escape review. Please contact Dr. Lyn Hollingshead for any needed clarifications.

## 2018-05-09 DIAGNOSIS — C44519 Basal cell carcinoma of skin of other part of trunk: Secondary | ICD-10-CM | POA: Diagnosis not present

## 2018-05-09 DIAGNOSIS — D485 Neoplasm of uncertain behavior of skin: Secondary | ICD-10-CM | POA: Diagnosis not present

## 2018-05-09 DIAGNOSIS — L570 Actinic keratosis: Secondary | ICD-10-CM | POA: Diagnosis not present

## 2018-05-13 ENCOUNTER — Encounter: Payer: Self-pay | Admitting: Osteopathic Medicine

## 2018-05-15 ENCOUNTER — Encounter: Payer: Self-pay | Admitting: Osteopathic Medicine

## 2018-05-15 MED ORDER — DIAZEPAM 10 MG PO TABS: 5.0000 mg | ORAL_TABLET | Freq: Every day | ORAL | 0 refills | Status: DC | PRN

## 2018-05-16 DIAGNOSIS — T8140XA Infection following a procedure, unspecified, initial encounter: Secondary | ICD-10-CM | POA: Diagnosis not present

## 2018-05-16 DIAGNOSIS — L231 Allergic contact dermatitis due to adhesives: Secondary | ICD-10-CM | POA: Diagnosis not present

## 2018-05-19 ENCOUNTER — Other Ambulatory Visit: Payer: Self-pay | Admitting: Osteopathic Medicine

## 2018-05-19 NOTE — Telephone Encounter (Signed)
Called pharmacy, verbal order given.  

## 2018-05-29 ENCOUNTER — Encounter: Payer: Self-pay | Admitting: Family Medicine

## 2018-05-29 ENCOUNTER — Ambulatory Visit (INDEPENDENT_AMBULATORY_CARE_PROVIDER_SITE_OTHER): Payer: Managed Care, Other (non HMO) | Admitting: Family Medicine

## 2018-05-29 VITALS — BP 115/75 | HR 78 | Ht 64.0 in | Wt 175.0 lb

## 2018-05-29 DIAGNOSIS — M7062 Trochanteric bursitis, left hip: Secondary | ICD-10-CM

## 2018-05-29 MED ORDER — CYCLOBENZAPRINE HCL 10 MG PO TABS: 5.0000 mg | ORAL_TABLET | Freq: Every evening | ORAL | 0 refills | Status: DC | PRN

## 2018-05-29 NOTE — Patient Instructions (Addendum)
Thank you for coming in today.  Attend PT.   Do the home stretches and side leg raises.  Continue heating pad and TENS unit.  Continue current medicine.  OK to add flexeril at bedtime as needed.   Recheck if not improving.   Come back or go to the emergency room if you notice new weakness new numbness problems walking or bowel or bladder problems.    Trochanteric Bursitis Rehab Ask your health care provider which exercises are safe for you. Do exercises exactly as told by your health care provider and adjust them as directed. It is normal to feel mild stretching, pulling, tightness, or discomfort as you do these exercises, but you should stop right away if you feel sudden pain or your pain gets worse.Do not begin these exercises until told by your health care provider. Stretching exercises These exercises warm up your muscles and joints and improve the movement and flexibility of your hip. These exercises also help to relieve pain and stiffness. Exercise A: Iliotibial band stretch  1. Lie on your side with your left / right leg in the top position. 2. Bend your left / right knee and grab your ankle. 3. Slowly bring your knee back so your thigh is behind your body. 4. Slowly lower your knee toward the floor until you feel a gentle stretch on the outside of your left / right thigh. If you do not feel a stretch and your knee will not fall farther, place the heel of your other foot on top of your outer knee and pull your thigh down farther. 5. Hold this position for __________ seconds. 6. Slowly return to the starting position. Repeat __________ times. Complete this exercise __________ times a day. Strengthening exercises These exercises build strength and endurance in your hip and pelvis. Endurance is the ability to use your muscles for a long time, even after they get tired. Exercise B: Bridge ( hip extensors) 1. Lie on your back on a firm surface with your knees bent and your feet  flat on the floor. 2. Tighten your buttocks muscles and lift your buttocks off the floor until your trunk is level with your thighs. You should feel the muscles working in your buttocks and the back of your thighs. If this exercise is too easy, try doing it with your arms crossed over your chest. 3. Hold this position for __________ seconds. 4. Slowly return to the starting position. 5. Let your muscles relax completely between repetitions. Repeat __________ times. Complete this exercise __________ times a day. Exercise C: Squats ( knee extensors and  quadriceps) 1. Stand in front of a table, with your feet and knees pointing straight ahead. You may rest your hands on the table for balance but not for support. 2. Slowly bend your knees and lower your hips like you are going to sit in a chair. ? Keep your weight over your heels, not over your toes. ? Keep your lower legs upright so they are parallel with the table legs. ? Do not let your hips go lower than your knees. ? Do not bend lower than told by your health care provider. ? If your hip pain increases, do not bend as low. 3. Hold this position for __________ seconds. 4. Slowly push with your legs to return to standing. Do not use your hands to pull yourself to standing. Repeat __________ times. Complete this exercise __________ times a day. Exercise D: Hip hike 1. Stand sideways on a bottom step.  Stand on your left / right leg with your other foot unsupported next to the step. You can hold onto the railing or wall if needed for balance. 2. Keeping your knees straight and your torso square, lift your left / right hip up toward the ceiling. 3. Hold this position for __________ seconds. 4. Slowly let your left / right hip lower toward the floor, past the starting position. Your foot should get closer to the floor. Do not lean or bend your knees. Repeat __________ times. Complete this exercise __________ times a day. Exercise E: Single leg  stand 1. Stand near a counter or door frame that you can hold onto for balance as needed. It is helpful to stand in front of a mirror for this exercise so you can watch your hip. 2. Squeeze your left / right buttock muscles then lift up your other foot. Do not let your left / right hip push out to the side. 3. Hold this position for __________ seconds. Repeat __________ times. Complete this exercise __________ times a day. This information is not intended to replace advice given to you by your health care provider. Make sure you discuss any questions you have with your health care provider. Document Released: 09/13/2004 Document Revised: 04/12/2016 Document Reviewed: 07/22/2015 Elsevier Interactive Patient Education  [employer].

## 2018-05-29 NOTE — Progress Notes (Signed)
Renee Norris is a 52 y.o. female who presents to [facility] today for low back pain and left lateral hip pain.  Patient developed pain starting yesterday.  She notes pain referring down the lateral thigh but not beyond the knee.  She denies any weakness or numbness bowel bladder dysfunction.  She denies any injury.  She notes that she did do some heavy lifting and pushing and pulling over the weekend but cannot recall any specific cause for her pain.  She is tried a variety of medications including tramadol Valium and ibuprofen.  Additionally she is tried massage heating pad and TENS unit which helped a little.  She feels well otherwise no fevers chills vomiting or diarrhea.    ROS:  As above  Exam:  BP 115/75   Pulse 78   Ht 5\' 4"  (1.626 m)   Wt 175 lb (79.4 kg)   LMP 04/21/2014 (Approximate)   BMI 30.04 kg/m  General: Well Developed, well nourished, and in no acute distress.  Neuro/Psych: Alert and oriented x3, extra-ocular muscles intact, able to move all 4 extremities, sensation grossly intact. Skin: Warm and dry, no rashes noted.  Respiratory: Not using accessory muscles, speaking in full sentences, trachea midline.  Cardiovascular: Pulses palpable, no extremity edema. Abdomen: Does not appear distended. MSK:  L-spine: Nontender to spinal midline.  Nontender paraspinal muscles. Lumbar motion normal extension.  Flexion limited.  Lateral lateral flexion limited and painful.  Rotation normal. Lower extremity reflexes sensation are intact throughout.  Strength is intact with the exception of hip abduction strength which is limited bilaterally 4/5 left and 4+/5 right.  Strength otherwise normal throughout.  Left hip: Normal-appearing normal motion. Tender to palpation greater trochanter. Hip abduction strength diminished as above and painful.  Hip rotational strength is normal.  Right hip: Normal-appearing nontender normal motion.  Hip  abduction strength slightly limited 4+/5.  Rotation strength normal.  Antalgic gait present.  EXAM: MRI LUMBAR SPINE WITHOUT CONTRAST  TECHNIQUE: Multiplanar, multisequence MR imaging of the lumbar spine was performed. No intravenous contrast was administered.  COMPARISON:  01/15/2016  FINDINGS: Segmentation:  5 lumbar type vertebral bodies.  Alignment: Mild straightening of the normal lumbar lordosis. 1 mm anterolisthesis at L3-4 in this position.  Vertebrae:  No fracture or primary bone lesion.  Conus medullaris: Extends to the L1 level and appears normal.  Paraspinal and other soft tissues: Negative  Disc levels:  T12-L1 and L1-2:  Normal.  L2-3: Mild bulging of the disc. Mild facet and ligamentous hypertrophy. No stenosis. Mild facet edema could be associated with pain.  L3-4: Advanced bilateral facet arthropathy with gaping, fluid-filled facet joints. Facet edema. 1 mm of anterolisthesis. Mild bulging of the disc. Mild narrowing of the canal but no compressive stenosis. Findings could certainly be associated with back pain or referred facet syndrome pain. Anterolisthesis would likely worsen with standing or flexion.  L4-5: Bulging of the disc with shallow protrusion in the left posterolateral to foraminal region. Facet and ligamentous hypertrophy. Narrowing of the lateral recesses worse on the left. Neural compression could occur, particularly in the left lateral recess. Facet osteoarthritis could contribute to pain.  L5-S1: Small left posterolateral disc herniation contacting the left S1 nerve root. Nerve compression is not demonstrated, but this could cause left S1 nerve irritation. Mild facet osteoarthritis.  Since the previous study, findings are quite similar with the exception of the newly seen shallow left-sided disc protrusion at L4-5.  IMPRESSION: L2-3: Disc  bulge. Facet osteoarthritis with mild edema. Facet osteoarthritis could be  symptomatic.  L3-4: Advanced bilateral facet osteoarthritis with edema in joint effusions. 1 mm anterolisthesis. Bulging of the disc. No compressive stenosis. Anterolisthesis could worsen with standing or flexion. The facet arthropathy would quite likely be painful.  L4-5: Disc bulge with newly seen shallow left posterolateral to foraminal disc protrusion. Facet and ligamentous hypertrophy. Slightly worsened stenosis of the left lateral recess and proximal foramen on the left that could be symptomatic. Facet arthropathy could also be symptomatic.  Chronic small left posterolateral disc herniation contacting the left S1 nerve root could cause irritation of that structure.   Electronically Signed   By: Paulina Fusi M.D.   On: 11/02/2016 12:01  I personally (independently) visualized and performed the interpretation of the images attached in this note.     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  Plan for physical therapy, continued existing prescription of tramadol and Valium as needed.  Limited cyclobenzaprine at bedtime as needed.  Continued heating pad and TENS unit.  Reviewed home exercise program.  Recheck if not improving.    Orders Placed This Encounter  Procedures  . Ambulatory referral to Physical Therapy    Referral Priority:   Routine    Referral Type:   Physical Medicine    Referral Reason:   Specialty Services Required    Requested Specialty:   Physical Therapy   Meds ordered this encounter  Medications  . cyclobenzaprine (FLEXERIL) 10 MG tablet    Sig: Take 0.5-1 tablets (5-10 mg total) by mouth at bedtime as needed for muscle spasms.    Dispense:  30 tablet    Refill:  0    Historical information moved to improve visibility of documentation.  Past Medical History:  Diagnosis Date  . Chronic back pain 01/30/2016   folowing with Ortho for Tramadol, we are managing benzo therapy, started TCA as well,  refer to PT, refer to [facility] for [facility]BT   . Depression 01/30/2016   TCA to help this as well as pain/sleep issues, refer for CBT   . Insomnia 01/30/2016   TCA, do not take with benzos   . Postmenopausal 01/30/2016   HRT from OBGYN    Past Surgical History:  Procedure Laterality Date  . ABDOMINAL SURGERY    . AUGMENTATION MAMMAPLASTY    . BREAST SURGERY    . GYN surgery    . TONSILLECTOMY     Social History   Tobacco Use  . Smoking status: Former Games developer  . Smokeless tobacco: Never Used  Substance Use Topics  . Alcohol use: No    Comment: 2-3 glasses of wine a day   family history includes Cancer in her father and mother; Diabetes in her father and mother; Hypertension in her father and mother.  Medications: Current Outpatient Medications  Medication Sig Dispense Refill  . ARMOUR THYROID 30 MG tablet TAKE ONE TABLET EVERY MORNING 30 MINUTES BEFORE BREAKFAST  3  . diazepam (VALIUM) 10 MG tablet Take 0.5-1 tablets (5-10 mg total) by mouth daily as needed for anxiety. 30 tablet 0  . estradiol (ESTRACE) 0.5 MG tablet Take 1 tablet (0.5 mg total) by mouth daily. 90 tablet 3  . progesterone (PROMETRIUM) 100 MG capsule Take 1-2 capsules (100-200 mg total) by mouth daily. 90 capsule 1  . traMADol (ULTRAM) 50 MG tablet TAKE 1 TABLET BY MOUTH EVERY 12 HOURS 50 tablet 2  . buPROPion [facility]  XL) 150 MG 24 hr tablet Take 150 mg by mouth daily.    . cyclobenzaprine (FLEXERIL) 10 MG tablet Take 0.5-1 tablets (5-10 mg total) by mouth at bedtime as needed for muscle spasms. 30 tablet 0   No current facility-administered medications for this visit.    Allergies  Allergen Reactions  . Aspirin       Discussed warning signs or symptoms. Please see discharge instructions. Patient expresses understanding.

## 2018-06-09 ENCOUNTER — Encounter: Payer: Self-pay | Admitting: Physical Therapy

## 2018-06-09 ENCOUNTER — Ambulatory Visit (INDEPENDENT_AMBULATORY_CARE_PROVIDER_SITE_OTHER): Payer: Managed Care, Other (non HMO) | Admitting: Physical Therapy

## 2018-06-09 DIAGNOSIS — M6281 Muscle weakness (generalized): Secondary | ICD-10-CM | POA: Diagnosis not present

## 2018-06-09 DIAGNOSIS — M25552 Pain in left hip: Secondary | ICD-10-CM | POA: Diagnosis not present

## 2018-06-09 NOTE — Therapy (Addendum)
[place], [place], [address] Phone: [phone]   Fax:  [phone]  Physical Therapy Evaluation/Discharge addendum  Patient Details  Name: Renee Norris MRN: [id number] Date of Birth: [birth year] Referring Provider (PT): Steva Colder   Encounter Date: 06/09/2018  PT End of Session - 06/09/18 0944    Visit Number  1    Number of Visits  12    Date for PT Re-Evaluation  07/21/18    PT Start Time  0805    PT Stop Time  0850    PT Time Calculation (min)  45 min    Activity Tolerance  Patient tolerated treatment well    Behavior During Therapy  [facility] for tasks assessed/performed       Past Medical History:  Diagnosis Date  . Chronic back pain 01/30/2016   folowing with Ortho for Tramadol, we are managing benzo therapy, started TCA as well, refer to PT, [facility] to [facility]   . Depression 01/30/2016   TCA to help this as well as pain/sleep issues, refer for CBT   . Insomnia 01/30/2016   TCA, do not take with benzos   . Postmenopausal 01/30/2016   HRT from OBGYN     Past Surgical History:  Procedure Laterality Date  . ABDOMINAL SURGERY    . AUGMENTATION MAMMAPLASTY    . BREAST SURGERY    . GYN surgery    . TONSILLECTOMY      There were no vitals filed for this visit.   Subjective Assessment - 06/09/18 0724    Subjective  Pt reports Lt sided back and hip for about 5 years, it started on Lt side, then after 2 years shifted to Rt side, now its back to Left side. She has had PT in the past for this with some relief. She tried chiro with no relief, tried injections and nerve ablation with no relief.  Her chief complaint is pain and discomfort with sitting. 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   (Pended)     Pertinent History  PMH: Cronic LBP, Depression, insomnia.  (Pended)     Limitations  Sitting  (Pended)     How long can you sit comfortably?   sitting more than 5 min  (Pended)     Diagnostic tests  lumbar MRI does show bulging discs, facet arthropathy, 1 mm anterolisthesis L3-4, Lt lateral stenosis L4-5. Disc herniation contacting Lt S1 nerve root.   (Pended)     Currently in Pain?  Yes  (Pended)     Pain Score  5   (Pended)     Pain Location  Hip  (Pended)    hip and Lt low back   Pain Orientation  Left;Posterior  (Pended)     Pain Type  Chronic pain  (Pended)     Pain Radiating Towards  down Lt posterior leg just above knee  (Pended)     Pain Onset  More than a month ago  (Pended)     Pain Frequency  Other (Comment)  (Pended)    both constant and intermittent depends on the day   Aggravating Factors   sitting, sometimes walking  (Pended)          [facility] PT Assessment - 06/09/18 0001      Assessment   Medical Diagnosis  Lt posterior hip pain/sciatica.    Referring Provider (PT)  Steva Colder  Onset Date/Surgical Date  --   chronic over 5 years   Next MD Visit  --   not scheduled     Precautions   Precautions  None      Restrictions   Weight Bearing Restrictions  No      Balance Screen   Has the patient fallen in the past 6 months  No      Prior Function   Level of Independence  Independent    Vocation  Full time employment    Vocation Requirements  office work      Cognition   Overall Cognitive Status  Within Functional Limits for tasks assessed      Observation/Other Assessments   Focus on Therapeutic Outcomes (FOTO)   51% limited      Sensation   Light Touch  Appears Intact      ROM / Strength   AROM / PROM / Strength  AROM;Strength      AROM   Overall AROM Comments  Lt sided hip/lumbar pain with ext, and side bending, Lt hip ROM WFL     AROM Assessment Site  Lumbar    Lumbar Flexion  WNL    Lumbar Extension  25%    Lumbar - Right Side Bend  50%    Lumbar - Left Side Bend  50%    Lumbar - Right Rotation  WNL    Lumbar - Left Rotation  WNL      Strength   Overall Strength Comments  knee WNL,  Lt hip flexion and abduciton limited to 4/5 MMT      Flexibility   Soft Tissue Assessment /Muscle Length  --   Tightness in Lt lumbar P.S, glutes, piriformis     Palpation   Palpation comment  TTP Lt piriformis, QL, lumbar P.S, glutes      Special Tests   Other special tests  + slump test, neg SLR, neg hip scour, neg FABER      Ambulation/Gait   Gait Comments  WFL                Objective measurements completed on examination: See above findings.      [facility] Adult PT Treatment/Exercise - 06/09/18 0001      Exercises   Exercises  Lumbar      Lumbar Exercises: Stretches   Double Knee to Chest Stretch  1 rep;60 seconds    Piriformis Stretch  Left;1 rep;60 seconds    Other Lumbar Stretch Exercise  supine DKTC with feet on ball and PT applying overpressure into Rt rotation for Lt facet gapping. 10 X 5 sec      Modalities   Modalities  Moist Heat;Electrical Stimulation      Moist Heat Therapy   Number Minutes Moist Heat  10 Minutes    Moist Heat Location  Lumbar Spine      Electrical Stimulation   Electrical Stimulation Location  Lt posterior hip    Electrical Stimulation Action  IFC    Electrical Stimulation Parameters  tolerance    Electrical Stimulation Goals  Pain;Tone             PT Education - 06/09/18 0943    Education Details  HEP, POC    Person(s) Educated  Patient    Methods  Explanation;Demonstration;Verbal cues;Handout    Comprehension  Verbalized understanding;Need further instruction          PT Long Term Goals - 06/09/18 1000      PT LONG  TERM GOAL #1   Title  Pt will report less than 43% limited on FOTO to show increased functional status. 6 weeks 07/21/18    Status  New      PT LONG TERM GOAL #2   Title  demo lumbar ROM WFL and  with no more than 2/10 pain. 6 weeks 07/21/18    Status  New      PT LONG TERM GOAL #3   Title  Improve core strength progressing with functional strengthening program and increasing sitting tolerance  to >15 min 6 weeks 07/21/18    Status  New      PT LONG TERM GOAL #4   Title  Independent in HEP. 6 weeks 07/21/18      PT LONG TERM GOAL #5   Title  Pt will improve Lt hip strength to at least 4+/5 MMT. 6 weeks 07/21/18    Status  New             Plan - 06/09/18 0946    Clinical Impression Statement  Pt presents with chronic Lt sided posterior hip pain, piriformis syndrome and sciatica. She does have bulging discs, facet arthropathy, lumbar stenosis, and 1 mm anteriolisthesis on lumbar MRI. She has increased sciatic nerve tension with slump test but had negative SLR test,  neg hip scour test, and neg FABERS tests today.  She does have decreased Lt hp strength and stabiliy, tightness in her hip rotators, lumbar P.S, and QL, decreased lumbar ROM, and increased pain limiting her full function. She will benefit from skilled PT to address her deficits. She did seem to respond favorably to Lt facet gapping today and MHP with TENS.     History and Personal Factors relevant to plan of care:  PMH: Cronic LBP, Depression, insomnia.    Clinical Presentation  Evolving    Clinical Presentation due to:  worsening pain    Clinical Decision Making  Moderate    Rehab Potential  Good    Clinical Impairments Affecting Rehab Potential  chronic nature of pain    PT Frequency  2x / week    PT Duration  6 weeks    PT Treatment/Interventions  Cryotherapy;Electrical Stimulation;Iontophoresis '4mg'$ /ml Dexamethasone;Moist Heat;Traction;Ultrasound;Therapeutic activities;Therapeutic exercise;Neuromuscular re-education;Manual techniques;Passive range of motion;Dry needling;Taping;Joint Manipulations    PT Next Visit Plan  review HEP, consider Lt lumbar facet gapping so flexion with sidebending or rotation, DN and modalaties as needed, core    Consulted and Agree with Plan of Care  Patient       Patient will benefit from skilled therapeutic intervention in order to improve the following deficits and impairments:   Decreased activity tolerance, Decreased endurance, Decreased range of motion, Decreased strength, Impaired flexibility, Pain  Visit Diagnosis: Pain in left hip  Muscle weakness (generalized)     Problem List Patient Active Problem List   Diagnosis Date Noted  . 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  . 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 Sinus bradycardia, chronic 04/04/2016  . Lumbar spinal stenosis 03/29/2016  . H/O mammogram 02/14/2016  . History of bradycardia 02/14/2016  . Chronic back pain 01/30/2016  . Depression 01/30/2016  . Insomnia 01/30/2016  . Cephalalgia 01/30/2016  . Medication management 01/30/2016  . Other fatigue 01/30/2016  . Postmenopausal 01/30/2016    Debbe Odea, PT, DPT 06/09/2018, 10:27 AM  [facility] [place], [place], [address] Phone: [phone]   Fax:  [phone]  Name: Renee  HAZELL Norris MRN: [id number] Date of Birth: 10/28/1965   PHYSICAL THERAPY DISCHARGE SUMMARY  Visits from Start of Care: 1  Current functional level related to goals / functional outcomes: See above   Remaining deficits: See above   Education / Equipment: HEP Plan: Patient agrees to discharge.  Patient goals were not met. Patient is being discharged due to not returning since the last visit.  ?????    Pt relays she was not able to take time off work for PT.  Elsie Ra, PT, DPT 07/11/18 1:19 PM

## 2018-06-10 ENCOUNTER — Encounter: Payer: Self-pay | Admitting: Osteopathic Medicine

## 2018-06-10 ENCOUNTER — Other Ambulatory Visit: Payer: Self-pay | Admitting: Osteopathic Medicine

## 2018-06-10 MED ORDER — VENLAFAXINE HCL ER 75 MG PO CP24: 75.0000 mg | ORAL_CAPSULE | Freq: Every day | ORAL | 1 refills | Status: DC

## 2018-06-12 ENCOUNTER — Encounter: Payer: Managed Care, Other (non HMO) | Admitting: Physical Therapy

## 2018-06-13 DIAGNOSIS — C44519 Basal cell carcinoma of skin of other part of trunk: Secondary | ICD-10-CM | POA: Diagnosis not present

## 2018-06-16 ENCOUNTER — Encounter: Payer: Managed Care, Other (non HMO) | Admitting: Rehabilitative and Restorative Service Providers"

## 2018-06-18 ENCOUNTER — Encounter: Payer: Managed Care, Other (non HMO) | Admitting: Physical Therapy

## 2018-06-25 ENCOUNTER — Encounter: Payer: Managed Care, Other (non HMO) | Admitting: Physical Therapy

## 2018-06-27 ENCOUNTER — Encounter: Payer: Managed Care, Other (non HMO) | Admitting: Physical Therapy

## 2018-07-04 ENCOUNTER — Other Ambulatory Visit: Payer: Self-pay | Admitting: Osteopathic Medicine

## 2018-07-06 ENCOUNTER — Other Ambulatory Visit: Payer: Self-pay | Admitting: Osteopathic Medicine

## 2018-07-22 ENCOUNTER — Encounter: Payer: Self-pay | Admitting: Family Medicine

## 2018-07-22 DIAGNOSIS — Z1211 Encounter for screening for malignant neoplasm of colon: Secondary | ICD-10-CM

## 2018-07-22 DIAGNOSIS — K635 Polyp of colon: Secondary | ICD-10-CM

## 2018-08-07 DIAGNOSIS — Z86010 Personal history of colonic polyps: Secondary | ICD-10-CM | POA: Diagnosis not present

## 2018-08-07 DIAGNOSIS — Z1211 Encounter for screening for malignant neoplasm of colon: Secondary | ICD-10-CM | POA: Diagnosis not present

## 2018-08-07 DIAGNOSIS — K635 Polyp of colon: Secondary | ICD-10-CM | POA: Diagnosis not present

## 2018-08-07 DIAGNOSIS — D125 Benign neoplasm of sigmoid colon: Secondary | ICD-10-CM | POA: Diagnosis not present

## 2018-08-07 LAB — HM COLONOSCOPY

## 2018-08-22 ENCOUNTER — Telehealth: Payer: Self-pay | Admitting: Family Medicine

## 2018-08-22 NOTE — Telephone Encounter (Signed)
Received records regarding recent colonoscopy.  Date of service August 07, 2018.  Sessile polyp.  Plan for repeat colonoscopy in 5 years. Result will be sent to abstract.

## 2018-08-28 ENCOUNTER — Encounter: Payer: Self-pay | Admitting: Osteopathic Medicine

## 2018-09-03 ENCOUNTER — Other Ambulatory Visit: Payer: Self-pay | Admitting: Physician Assistant

## 2018-09-03 MED ORDER — DIAZEPAM 10 MG PO TABS: 5.0000 mg | ORAL_TABLET | Freq: Every day | ORAL | 0 refills | Status: DC | PRN

## 2018-09-03 NOTE — Telephone Encounter (Signed)
[facility] pharmacy requesting med refill for diazepam.

## 2018-09-03 NOTE — Telephone Encounter (Signed)
Pt has been updated. No other inquiries during call.  

## 2018-09-24 DIAGNOSIS — D2261 Melanocytic nevi of right upper limb, including shoulder: Secondary | ICD-10-CM | POA: Diagnosis not present

## 2018-09-24 DIAGNOSIS — D2262 Melanocytic nevi of left upper limb, including shoulder: Secondary | ICD-10-CM | POA: Diagnosis not present

## 2018-09-24 DIAGNOSIS — D225 Melanocytic nevi of trunk: Secondary | ICD-10-CM | POA: Diagnosis not present

## 2018-09-24 DIAGNOSIS — Z85828 Personal history of other malignant neoplasm of skin: Secondary | ICD-10-CM | POA: Diagnosis not present

## 2018-10-14 ENCOUNTER — Encounter: Payer: Self-pay | Admitting: Family Medicine

## 2018-10-14 DIAGNOSIS — M545 Low back pain, unspecified: Secondary | ICD-10-CM

## 2018-10-14 DIAGNOSIS — G8929 Other chronic pain: Secondary | ICD-10-CM

## 2018-10-29 ENCOUNTER — Encounter: Payer: Self-pay | Admitting: Osteopathic Medicine

## 2018-11-04 ENCOUNTER — Other Ambulatory Visit: Payer: Self-pay

## 2018-11-04 ENCOUNTER — Encounter: Payer: Self-pay | Admitting: Osteopathic Medicine

## 2018-11-04 ENCOUNTER — Telehealth: Payer: Self-pay | Admitting: Osteopathic Medicine

## 2018-11-04 ENCOUNTER — Ambulatory Visit (INDEPENDENT_AMBULATORY_CARE_PROVIDER_SITE_OTHER): Payer: BLUE CROSS/BLUE SHIELD | Admitting: Osteopathic Medicine

## 2018-11-04 VITALS — BP 125/79 | HR 65 | Temp 98.2°F | Wt 186.3 lb

## 2018-11-04 DIAGNOSIS — F331 Major depressive disorder, recurrent, moderate: Secondary | ICD-10-CM | POA: Diagnosis not present

## 2018-11-04 MED ORDER — VORTIOXETINE HBR 10 MG PO TABS: 10.0000 mg | ORAL_TABLET | Freq: Every day | ORAL | 5 refills | Status: DC

## 2018-11-04 MED ORDER — DIAZEPAM 10 MG PO TABS: 5.0000 mg | ORAL_TABLET | Freq: Every day | ORAL | 0 refills | Status: DC | PRN

## 2018-11-04 NOTE — Patient Instructions (Addendum)
Will trial Trintellix for depression/mood. This Rx will likely have to go through an approval process with your insurance/savings card. Often we are able to get this taken care of in a few days, but sometimes this process can take up to 2 weeks.   Please call regarding referral for possible spinal stimulatory: [facility] at phone [phone].

## 2018-11-04 NOTE — Telephone Encounter (Signed)
Approvedtoday (Trintellix) CaseId:[id number];Status:Approved;Review Type:Prior Auth;Coverage Start Date:10/05/2018;Coverage End Date:11/04/2019; Pharmacy aware.

## 2018-11-04 NOTE — Progress Notes (Signed)
HPI: Renee Norris is a 53 y.o. female who  has a past medical history of Chronic back pain (01/30/2016), Depression (01/30/2016), Insomnia (01/30/2016), and Postmenopausal (01/30/2016).  she presents to [facility] today, 11/04/18,  for chief complaint of:  Medication management - depression, mood  Previously tried and ineffective/side effects: Lamictal, Topamax, Cymbalta, Welbutrin, Elavil, Effexor. Was on Lamictal and Topamax through previous psychiatrist, would like to discuss restarting these. Noting more lack of motivation, depressed moods, irritability, weight gain.    Depression screen [facility] 2/9 11/04/2018 04/22/2018 03/20/2018  Decreased Interest 2 1 1   Down, Depressed, Hopeless 2 0 1  PHQ - 2 Score 4 1 2   Altered sleeping 2 0 2  Tired, decreased energy 3 1 2   Change in appetite 2 0 2  Feeling bad or failure about yourself  2 1 2   Trouble concentrating 2 0 1  Moving slowly or fidgety/restless 2 0 3  Suicidal thoughts 0 0 0  PHQ-9 Score 17 3 14   Difficult doing work/chores Somewhat difficult Somewhat difficult Somewhat difficult   GAD 7 : Generalized Anxiety Score 11/04/2018 04/22/2018 03/20/2018 03/14/2017  Nervous, Anxious, on Edge 1 0 0 0  Control/stop worrying 1 0 0 0  Worry too much - different things 1 0 0 0  Trouble relaxing 2 0 0 0  Restless 1 0 0 0  Easily annoyed or irritable 3 0 1 0  Afraid - awful might happen 2 0 0 0  Total GAD 7 Score 11 0 1 0  Anxiety Difficulty Somewhat difficult Not difficult at all Somewhat difficult -        At today's visit 11/04/18 ... PMH, PSH, FH reviewed and updated as needed.  Current medication list and allergy/intolerance hx reviewed and updated as needed. (See remainder of HPI, ROS, Phys Exam below)         ASSESSMENT/PLAN: The encounter diagnosis was Moderate episode of recurrent major depressive disorder (HCC).    Meds ordered this encounter  Medications  . vortioxetine HBr (TRINTELLIX)  10 MG TABS tablet    Sig: Take 1 tablet (10 mg total) by mouth daily.    Dispense:  30 tablet    Refill:  5    Please run w/ savings card, please alert our office asap if PA needed, thanks!  . diazepam (VALIUM) 10 MG tablet    Sig: Take 0.5-1 tablets (5-10 mg total) by mouth daily as needed for anxiety.    Dispense:  30 tablet    Refill:  0    Patient Instructions  Will trial Trintellix for depression/mood. This Rx will likely have to go through an approval process with your insurance/savings card. Often we are able to get this taken care of in a few days, but sometimes this process can take up to 2 weeks.   Please call regarding referral for possible spinal stimulatory: [facility] at phone [phone].        Follow-up plan: Return in about 4 weeks (around 12/02/2018) for recheck on Trintellix, call/message sooner if needed.                                                 ################################################# ################################################# ################################################# #################################################    Current Meds  Medication Sig  . cyclobenzaprine (FLEXERIL) 10 MG tablet Take 0.5-1 tablets (5-10 mg total)  by mouth at bedtime as needed for muscle spasms.  . diazepam (VALIUM) 10 MG tablet Take 0.5-1 tablets (5-10 mg total) by mouth daily as needed for anxiety.  . [DISCONTINUED] diazepam (VALIUM) 10 MG tablet Take 0.5-1 tablets (5-10 mg total) by mouth daily as needed for anxiety.    Allergies  Allergen Reactions  . Aspirin Other (See Comments)    Dizziness/swelling       Review of Systems:  Constitutional: No recent illness  HEENT: No  headache, no vision change  Cardiac: No  chest pain, No  pressure, No palpitations  Respiratory:  No  shortness of breath.   Musculoskeletal: No new myalgia/arthralgia  Psychiatric: No  concerns with  depression, No  concerns with anxiety  Exam:  BP 125/79 (BP Location: Left Arm, Patient Position: Sitting, Cuff Size: Normal)   Pulse 65   Temp 98.2 F (36.8 C) (Oral)   Wt 186 lb 4.8 oz (84.5 kg)   LMP 04/21/2014 (Approximate)   BMI 31.98 kg/m   Constitutional: VS see above. General Appearance: alert, well-developed, well-nourished, NAD  Eyes: Normal lids and conjunctive, non-icteric sclera  Ears, Nose, Mouth, Throat: MMM, Normal external inspection ears/nares/mouth/lips/gums.  Neck: No masses, trachea midline.   Respiratory: Normal respiratory effort.   Musculoskeletal: Gait normal.   Neurological: Normal balance/coordination. No tremor.  Skin: warm, dry, intact.   Psychiatric: Normal judgment/insight. Normal mood and affect. Oriented x3.       Visit summary with medication list and pertinent instructions was printed for patient to review, patient was advised to alert [place] if any updates are needed. All questions at time of visit were answered - patient instructed to contact office with any additional concerns. ER/RTC precautions were reviewed with the patient and understanding verbalized.   Note: Total time spent 25 minutes, greater than 50% of the visit was spent face-to-face counseling and coordinating care for the following: The encounter diagnosis was Moderate episode of recurrent major depressive disorder (HCC)..  Please note: voice recognition software was used to produce this document, and typos may escape review. Please contact Dr. Lyn Hollingshead for any needed clarifications.    Follow up plan: Return in about 4 weeks (around 12/02/2018) for recheck on Trintellix, call/message sooner if needed.

## 2018-12-09 ENCOUNTER — Encounter: Payer: Self-pay | Admitting: Osteopathic Medicine

## 2018-12-22 DIAGNOSIS — R6882 Decreased libido: Secondary | ICD-10-CM | POA: Diagnosis not present

## 2018-12-22 DIAGNOSIS — N951 Menopausal and female climacteric states: Secondary | ICD-10-CM | POA: Diagnosis not present

## 2018-12-22 DIAGNOSIS — Z8639 Personal history of other endocrine, nutritional and metabolic disease: Secondary | ICD-10-CM | POA: Diagnosis not present

## 2018-12-22 DIAGNOSIS — E559 Vitamin D deficiency, unspecified: Secondary | ICD-10-CM | POA: Diagnosis not present

## 2018-12-22 DIAGNOSIS — M5136 Other intervertebral disc degeneration, lumbar region: Secondary | ICD-10-CM | POA: Diagnosis not present

## 2018-12-22 DIAGNOSIS — E7800 Pure hypercholesterolemia, unspecified: Secondary | ICD-10-CM | POA: Diagnosis not present

## 2018-12-22 LAB — BASIC METABOLIC PANEL
BUN: 13 (ref 4–21)
Creatinine: 0.9 (ref 0.5–1.1)
Glucose: 86
Potassium: 4.3 (ref 3.4–5.3)
Sodium: 138 (ref 137–147)

## 2018-12-22 LAB — HEPATIC FUNCTION PANEL
ALT: 19 (ref 7–35)
AST: 20 (ref 13–35)
Bilirubin, Total: 0.3

## 2018-12-22 LAB — LIPID PANEL
Cholesterol: 288 — AB (ref 0–200)
HDL: 66 (ref 35–70)
LDL Cholesterol: 195
Triglycerides: 135 (ref 40–160)

## 2018-12-22 LAB — CBC AND DIFFERENTIAL
HCT: 41 (ref 36–46)
Hemoglobin: 14 (ref 12.0–16.0)
Platelets: 308 (ref 150–399)
WBC: 4.6

## 2018-12-26 ENCOUNTER — Encounter: Payer: Self-pay | Admitting: Osteopathic Medicine

## 2018-12-26 ENCOUNTER — Telehealth: Payer: BLUE CROSS/BLUE SHIELD | Admitting: Family

## 2018-12-26 ENCOUNTER — Encounter: Payer: Self-pay | Admitting: Family Medicine

## 2018-12-26 DIAGNOSIS — Z719 Counseling, unspecified: Secondary | ICD-10-CM

## 2018-12-26 DIAGNOSIS — R635 Abnormal weight gain: Secondary | ICD-10-CM

## 2018-12-26 NOTE — Progress Notes (Signed)
Thank you for the details you included in the comment boxes. Those details are very helpful in determining the best course of treatment for you and help [place] to provide the best care.  Weight loss and depression are not symptoms we can treat remotely through the E-visit program. Please be seen face-to-face.  Based on what you shared with me, I feel your condition warrants further evaluation and I recommend that you be seen for a face to face office visit.     NOTE: If you entered your credit card information for this eVisit, you will not be charged. You may see a "hold" on your card for the $35 but that hold will drop off and you will not have a charge processed.  If you are having a true medical emergency please call 911.  If you need an urgent face to face visit,  has four urgent care centers for your convenience.    PLEASE NOTE: THE [facility] LOCATIONS AND URGENT CARE CLINICS DO NOT HAVE THE TESTING FOR CORONAVIRUS COVID19 AVAILABLE.  IF YOU FEEL YOU NEED THIS TEST YOU MUST GO TO A TRIAGE LOCATION AT ONE OF THE HOSPITAL EMERGENCY DEPARTMENTS   [email or web address] to reserve your spot online an avoid wait times  [facility] [address], [address] [place], [place] [address] Modified hours of operation: Monday-Friday, 12 PM to 6 PM  Saturday & Sunday 10 AM to 4 PM *Across the street from [employer]  [employer] (New Address!) [address], [address] [place], [place] [address] *Just off [employer], across the road from [place]* Modified hours of operation: Monday-Friday, 12 PM to 6 PM  Closed Saturday & Sunday  [facility]'s modified hours of operation will be in effect from May 1 until May 31   The following sites will take your insurance:  . [facility]  [phone] Get Driving Directions Find a Provider at this Location  [address] [place], [place] [address] . 10 am to 8 pm Monday-Friday  . 12 pm to 8 pm Saturday-Sunday   . [facility]  [phone] Get Driving Directions Find a Provider at this Location  [address], [address] [place], [place] [address] . 8 am to 8 pm Monday-Friday . 9 am to 6 pm Saturday . 11 am to 6 pm Sunday   . [facility] at [facility]  [phone] Get Driving Directions  [address] [place], [place] [address] . 8 am to 8 pm Monday-Friday . 8 am to 4 pm Saturday-Sunday   Your e-visit answers were reviewed by a board certified advanced clinical practitioner to complete your personal care plan.  Thank you for using e-Visits.

## 2019-01-02 ENCOUNTER — Encounter: Payer: Self-pay | Admitting: Osteopathic Medicine

## 2019-01-15 ENCOUNTER — Encounter (INDEPENDENT_AMBULATORY_CARE_PROVIDER_SITE_OTHER): Payer: BLUE CROSS/BLUE SHIELD | Admitting: Osteopathic Medicine

## 2019-01-15 DIAGNOSIS — E782 Mixed hyperlipidemia: Secondary | ICD-10-CM | POA: Diagnosis not present

## 2019-01-15 NOTE — Telephone Encounter (Signed)
Please contact Pt to schedule virtual visit with PCP regarding mood follow up.

## 2019-01-15 NOTE — Telephone Encounter (Signed)
Time spent 11 mins

## 2019-01-16 ENCOUNTER — Ambulatory Visit (INDEPENDENT_AMBULATORY_CARE_PROVIDER_SITE_OTHER): Payer: BLUE CROSS/BLUE SHIELD | Admitting: Osteopathic Medicine

## 2019-01-16 ENCOUNTER — Encounter: Payer: Self-pay | Admitting: Osteopathic Medicine

## 2019-01-16 DIAGNOSIS — F419 Anxiety disorder, unspecified: Secondary | ICD-10-CM

## 2019-01-16 DIAGNOSIS — F329 Major depressive disorder, single episode, unspecified: Secondary | ICD-10-CM | POA: Diagnosis not present

## 2019-01-16 DIAGNOSIS — F32A Depression, unspecified: Secondary | ICD-10-CM

## 2019-01-16 DIAGNOSIS — R12 Heartburn: Secondary | ICD-10-CM

## 2019-01-16 DIAGNOSIS — E7801 Familial hypercholesterolemia: Secondary | ICD-10-CM

## 2019-01-16 MED ORDER — DULOXETINE HCL 60 MG PO CPEP: 60.0000 mg | ORAL_CAPSULE | Freq: Every day | ORAL | 3 refills | Status: DC

## 2019-01-16 MED ORDER — PRAVASTATIN SODIUM 40 MG PO TABS: 40.0000 mg | ORAL_TABLET | Freq: Every day | ORAL | 3 refills | Status: DC

## 2019-01-16 NOTE — Progress Notes (Signed)
Virtual Visit via Video (App used: Doximity) Note  I connected with      Renee Norris on 01/16/19 at 10:25 by a telemedicine application and verified that I am speaking with the correct person using two identifiers.  Patient is at home I am in office    I discussed the limitations of evaluation and management by telemedicine and the availability of in person appointments. The patient expressed understanding and agreed to proceed.  History of Present Illness: Renee Norris is a 53 y.o. female who would like to discuss  Statins Anti-depressant     Recently had blood work done at [facility], showed significantly elevated LDL.  Patient states high cholesterol runs in her family, her brother is on antihyperlipidemics as well.  She has some concerns about the statins in terms of muscle ache potential  Anxiety/depression, chronic pain: Patient has been on Cymbalta 60 mg for the past month and doing really well on this.  Thinks it might be causing some heartburn.     Observations/Objective: LMP 04/21/2014 (Approximate)  BP Readings from Last 3 Encounters:  11/04/18 125/79  05/29/18 115/75  04/22/18 122/74   Exam: Normal Speech.  NAD  Lab and Radiology Results No results found for this or any previous visit (from the past 72 hour(s)). No results found.     Assessment and Plan: 53 y.o. female with The primary encounter diagnosis was Familial hypercholesterolemia. Diagnoses of Anxiety and depression and Heartburn were also pertinent to this visit.   PDMP not reviewed this encounter. Orders Placed This Encounter  Procedures  . Lipid panel  . COMPLETE METABOLIC PANEL WITH GFR   Meds ordered this encounter  Medications  . DULoxetine (CYMBALTA) 60 MG capsule    Sig: Take 1 capsule (60 mg total) by mouth daily.    Dispense:  90 capsule    Refill:  3  . pravastatin (PRAVACHOL) 40 MG tablet    Sig: Take 1 tablet (40 mg total) by mouth daily.    Dispense:  90  tablet    Refill:  3   Patient Instructions  Heartburn can occur in about 2% of people who take Cymbalta.  Can give it some time to see if this resolves, but if not would recommend taking Tums or Pepto-Bismol over-the-counter as needed for heartburn, could consider daily treatment with famotidine/Pepcid.  Let me know how this is working and I can send a prescription if needed.  Starting Pravastatin, which is a mild statin with lower risk of muscle aches and pains.  Can start this at half a tablet for the first week or two, let me know if there are any issues.  If muscle aches develop, can try supplementation with co-Q10.  If this does not help, let me know since there are other options for treatment of high cholesterol that we could pursue.     Instructions sent via MyChart. If MyChart not available, pt was given option for info via personal e-mail w/ no guarantee of protected health info over unsecured e-mail communication, and MyChart sign-up instructions were included.   Follow Up Instructions: Return in about 3 months (around 04/18/2019) for Lab visit to recheck lipids and metabolic panel.    I discussed the assessment and treatment plan with the patient. The patient was provided an opportunity to ask questions and all were answered. The patient agreed with the plan and demonstrated an understanding of the instructions.   The patient was advised to call back  or seek an in-person evaluation if any new concerns, if symptoms worsen or if the condition fails to improve as anticipated.  25 minutes of non-face-to-face time was provided during this encounter.                      Historical information moved to improve visibility of documentation.  Past Medical History:  Diagnosis Date  . Chronic back pain 01/30/2016   folowing with Ortho for Tramadol, we are managing benzo therapy, started TCA as well, refer to PT, [facility]   . Depression 01/30/2016   TCA  to help this as well as pain/sleep issues, refer for CBT   . Insomnia 01/30/2016   TCA, do not take with benzos   . Postmenopausal 01/30/2016   HRT from OBGYN    Past Surgical History:  Procedure Laterality Date  . ABDOMINAL SURGERY    . AUGMENTATION MAMMAPLASTY    . BREAST SURGERY    . GYN surgery    . TONSILLECTOMY     Social History   Tobacco Use  . Smoking status: Former Games developermoker  . Smokeless tobacco: Never Used  Substance Use Topics  . Alcohol use: No    Comment: 2-3 glasses of wine a day   family history includes Cancer in her father and mother; Diabetes in her father and mother; Hypertension in her father and mother.  Medications: Current Outpatient Medications  Medication Sig Dispense Refill  . ARMOUR THYROID 30 MG tablet TAKE ONE TABLET EVERY MORNING 30 MINUTES BEFORE BREAKFAST  3  . cyclobenzaprine (FLEXERIL) 10 MG tablet Take 0.5-1 tablets (5-10 mg total) by mouth at bedtime as needed for muscle spasms. 30 tablet 0  . diazepam (VALIUM) 10 MG tablet Take 0.5-1 tablets (5-10 mg total) by mouth daily as needed for anxiety. 30 tablet 0  . DULoxetine (CYMBALTA) 60 MG capsule Take 1 capsule (60 mg total) by mouth daily. 90 capsule 3  . pravastatin (PRAVACHOL) 40 MG tablet Take 1 tablet (40 mg total) by mouth daily. 90 tablet 3   No current facility-administered medications for this visit.    Allergies  Allergen Reactions  . Aspirin Other (See Comments)    Dizziness/swelling    PDMP not reviewed this encounter. Orders Placed This Encounter  Procedures  . Lipid panel  . COMPLETE METABOLIC PANEL WITH GFR   Meds ordered this encounter  Medications  . DULoxetine (CYMBALTA) 60 MG capsule    Sig: Take 1 capsule (60 mg total) by mouth daily.    Dispense:  90 capsule    Refill:  3  . pravastatin (PRAVACHOL) 40 MG tablet    Sig: Take 1 tablet (40 mg total) by mouth daily.    Dispense:  90 tablet    Refill:  3

## 2019-01-16 NOTE — Patient Instructions (Signed)
Heartburn can occur in about 2% of people who take Cymbalta.  Can give it some time to see if this resolves, but if not would recommend taking Tums or Pepto-Bismol over-the-counter as needed for heartburn, could consider daily treatment with famotidine/Pepcid.  Let me know how this is working and I can send a prescription if needed.  Starting Pravastatin, which is a mild statin with lower risk of muscle aches and pains.  Can start this at half a tablet for the first week or two, let me know if there are any issues.  If muscle aches develop, can try supplementation with co-Q10.  If this does not help, let me know since there are other options for treatment of high cholesterol that we could pursue.

## 2019-01-21 NOTE — Telephone Encounter (Signed)
I will call and schedule virtual appointment for patient - CF

## 2019-02-25 ENCOUNTER — Encounter: Payer: Self-pay | Admitting: Osteopathic Medicine

## 2019-02-27 ENCOUNTER — Encounter: Payer: Self-pay | Admitting: Osteopathic Medicine

## 2019-04-02 ENCOUNTER — Other Ambulatory Visit: Payer: Self-pay | Admitting: Osteopathic Medicine

## 2019-04-02 NOTE — Telephone Encounter (Signed)
[facility] requesting med refill for diazepam.

## 2019-04-10 ENCOUNTER — Encounter: Payer: Self-pay | Admitting: Family Medicine

## 2019-04-10 ENCOUNTER — Telehealth: Payer: Self-pay | Admitting: Osteopathic Medicine

## 2019-04-10 NOTE — Telephone Encounter (Signed)
Patient calling in stating that she needs a physical for work. Started a new job and she can only come in Monday and Tuesday from 10-11am or Wednesdays after 3pm. Patient said she would also be okay with seeing another provider. Please advise on what would be best to do since none of the slots available work for patient.

## 2019-04-13 NOTE — Telephone Encounter (Signed)
Can put her wherever on my schedule as long as not back-to-back with another physical or new patient / hospital follow-up

## 2019-04-13 NOTE — Telephone Encounter (Signed)
Contacted patient and made appointment. No further questions.

## 2019-04-24 ENCOUNTER — Encounter: Payer: Self-pay | Admitting: Osteopathic Medicine

## 2019-04-24 ENCOUNTER — Other Ambulatory Visit: Payer: Self-pay

## 2019-04-24 ENCOUNTER — Ambulatory Visit (INDEPENDENT_AMBULATORY_CARE_PROVIDER_SITE_OTHER): Payer: BC Managed Care – PPO | Admitting: Osteopathic Medicine

## 2019-04-24 VITALS — BP 121/78 | HR 65 | Temp 98.1°F | Wt 177.8 lb

## 2019-04-24 DIAGNOSIS — Z23 Encounter for immunization: Secondary | ICD-10-CM | POA: Diagnosis not present

## 2019-04-24 DIAGNOSIS — Z0184 Encounter for antibody response examination: Secondary | ICD-10-CM

## 2019-04-24 DIAGNOSIS — Z0000 Encounter for general adult medical examination without abnormal findings: Secondary | ICD-10-CM

## 2019-04-24 NOTE — Progress Notes (Signed)
HPI: Renee Norris is a 53 y.o. female who  has a past medical history of Chronic back pain (01/30/2016), Depression (01/30/2016), Insomnia (01/30/2016), and Postmenopausal (01/30/2016).  she presents to [facility] today, 04/24/19,  for chief complaint of: Annual physical     Patient here for annual physical / wellness exam.  See preventive care reviewed as below.  Recent labs reviewed in detail with the patient.   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 Familial hypercholesterolemia 01/16/2019  . Heartburn 01/16/2019  . 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  . 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 Sinus bradycardia, chronic 04/04/2016  . Lumbar spinal stenosis 03/29/2016  . H/O mammogram 02/14/2016  . History of bradycardia 02/14/2016  . Chronic back pain 01/30/2016  . Depression 01/30/2016  . Insomnia 01/30/2016  . Cephalalgia 01/30/2016  . Medication management 01/30/2016  . Other fatigue 01/30/2016  . Postmenopausal 01/30/2016    Past Surgical History:  Procedure Laterality Date  . ABDOMINAL SURGERY    . AUGMENTATION MAMMAPLASTY    . BREAST SURGERY    . GYN surgery    . TONSILLECTOMY      Social History   Tobacco Use  . Smoking status: Former Games developer  . Smokeless tobacco: Never Used  Substance Use Topics  . Alcohol use: No    Comment: 2-3 glasses of wine a day    Family History  Problem Relation Age of Onset  . Diabetes Mother   . Hypertension Mother   . Cancer Mother   . Diabetes Father   . Hypertension Father   . Cancer Father      Current medication list and allergy/intolerance  information reviewed:    Current Outpatient Medications  Medication Sig Dispense Refill  . diazepam (VALIUM) 10 MG tablet TAKE 0.5-1 TABLETS (5-10 MG TOTAL) BY MOUTH DAILY AS NEEDED FOR ANXIETY. 30 tablet 0  . DULoxetine (CYMBALTA) 60 MG capsule Take 1 capsule (60 mg total) by mouth daily. 90 capsule 3  . pravastatin (PRAVACHOL) 40 MG tablet Take 1 tablet (40 mg total) by mouth daily. 90 tablet 3  . ARMOUR THYROID 30 MG tablet TAKE ONE TABLET EVERY MORNING 30 MINUTES BEFORE BREAKFAST  3  . cyclobenzaprine (FLEXERIL) 10 MG tablet Take 0.5-1 tablets (5-10 mg total) by mouth at bedtime as needed for muscle spasms. (Patient not taking: Reported on 04/24/2019) 30 tablet 0   No current facility-administered medications for this visit.     Allergies  Allergen Reactions  . Aspirin Other (See Comments)    Dizziness/swelling      Review of Systems:  Constitutional:  No  fever, no chills, No recent illness, No unintentional weight changes. No significant fatigue.   HEENT: No  headache, no vision change, no hearing change, No sore throat, No  sinus pressure  Cardiac: No  chest pain, No  pressure  Respiratory:  No  shortness of breath. No  Cough  Gastrointestinal: No  abdominal pain, No  nausea, No  vomiting,  No  blood in stool, No  diarrhea, No  constipation   Musculoskeletal: No new myalgia/arthralgia  Skin: No  Rash, No other wounds/concerning lesions  Genitourinary:  No  incontinence, No  abnormal genital bleeding, No abnormal genital discharge  Hem/Onc: No  easy bruising/bleeding, No  abnormal lymph node  Endocrine: No cold intolerance,  No heat intolerance.  Neurologic: No  weakness, No  dizziness  Psychiatric: No  concerns with depression, No  concerns with anxiety, No sleep problems, No mood problems  Exam:  BP 121/78 (BP Location: Left Arm, Patient Position: Sitting, Cuff Size: Normal)   Pulse 65   Temp 98.1 F (36.7 C) (Oral)   Wt 177 lb 12.8 oz (80.6 kg)   LMP  04/21/2014 (Approximate)   BMI 30.52 kg/m   Constitutional: VS see above. General Appearance: alert, well-developed, well-nourished, NAD  Eyes: Normal lids and conjunctive, non-icteric sclera  Ears, Nose, Mouth, Throat: TM normal bilaterally.  Neck: No masses, trachea midline. No thyroid enlargement. No tenderness/mass appreciated. No lymphadenopathy  Respiratory: Normal respiratory effort. no wheeze, no rhonchi, no rales  Cardiovascular: S1/S2 normal, no murmur, no rub/gallop auscultated. RRR. No lower extremity edema.   Gastrointestinal: Nontender, no masses. No hepatomegaly, no splenomegaly. No hernia appreciated. Bowel sounds normal. Rectal exam deferred.   Musculoskeletal: Gait normal. No clubbing/cyanosis of digits.   Neurological: Normal balance/coordination. No tremor. No cranial nerve deficit on limited exam. Motor and sensation intact and symmetric. Cerebellar reflexes intact.   Skin: warm, dry, intact. No rash/ulcer. No concerning nevi or subq nodules on limited exam.    Psychiatric: Normal judgment/insight. Normal mood and affect. Oriented x3.       ASSESSMENT/PLAN: The primary encounter diagnosis was Annual physical exam. Diagnoses of Need for influenza vaccination, Need for varicella vaccine, and Immunity status testing were also pertinent to this visit.   Orders Placed This Encounter  Procedures  . Flu Vaccine QUAD 6+ mos PF IM (Fluarix Quad PF)  . Varicella-zoster vaccine IM (Shingrix)  . Hepatitis B surface antibody,qualitative  . Measles/Mumps/Rubella Immunity  . Varicella zoster antibody, IgG  . QuantiFERON-TB Gold Plus    No orders of the defined types were placed in this encounter.   Patient Instructions  General Preventive Care  Most recent routine screening lipids/other labs: done 12/2018, will recheck cholesterol and blood work for immunity status testing for school.   Tobacco: don't!   Alcohol: responsible moderation is ok for most adults  - if you have concerns about your alcohol intake, please talk to me!   Exercise: as tolerated to reduce risk of cardiovascular disease and diabetes. Strength training will also prevent osteoporosis.   Mental health: if need for mental health care (medicines, counseling, other), or concerns about moods, please let me know!   Sexual health: if need for STD testing, or if concerns with libido/pain problems, please let me know!   Advanced Directive: Living Will and/or Healthcare Power of Attorney recommended for all adults, regardless of age or health.  Vaccines  Flu vaccine: recommended for almost everyone, every fall.   Shingles vaccine: Shingrix recommended after age 42. 2 shots then done! Due for 2nd vaccine in 2-6 months.   Pneumonia vaccines: Prevnar and Pneumovax recommended after age 46  Tetanus booster: Tdap recommended every 10 years.  Cancer screenings   Colon cancer screening: due 2024.   Breast cancer screening: mammogram recommended annually after age 78.   Cervical cancer screening: Pap due 2023  Lung cancer screening: CT chest every year for those age 8 to 55 years old with ?30 pack year smoking history, who either currently smoke or have quit within the past 15 years. Infection screenings .  HIV: recommended screening at least once age 55-65, more often as needed. . Gonorrhea/Chlamydia: screening as needed, though many insurances require testing for anyone on birth control pills. . Hepatitis C: recommended for anyone born [id number] . TB: certain at-risk populations, or depending on work requirements and/or travel history Other . Bone Density Test: recommended for women at age 55, men at age 55, sooner depending on risk factors . Abdominal Aortic Aneurysm: screening with ultrasound recommended once for men age 55-75 who have ever smoked         Visit summary with medication list and pertinent instructions was printed for patient to review. All questions at time  of visit were answered - patient instructed to contact office with any additional concerns or updates. ER/RTC precautions were reviewed with the patient.      Please note: voice recognition software was used to produce this document, and typos may escape review. Please contact Dr. Lyn HollingsheadAlexander for any needed clarifications.     Follow-up plan: Return in about 6 months (around 10/22/2019) for routine check w/ labs including thyroid .

## 2019-04-24 NOTE — Patient Instructions (Addendum)
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   Tobacco: don't!   Alcohol: responsible moderation is ok for most adults - if you have concerns about your alcohol intake, please talk to me!   Exercise: as tolerated to reduce risk of cardiovascular disease and diabetes. Strength training will also prevent osteoporosis.   Mental health: if need for mental health care (medicines, counseling, other), or concerns about moods, please let me know!   Sexual health: if need for STD testing, or if concerns with libido/pain problems, please let me know!   Advanced Directive: Living Will and/or Healthcare Power of Attorney recommended for all adults, regardless of age or health.  Vaccines  Flu vaccine: recommended for almost everyone, every fall.   Shingles vaccine: Shingrix recommended after age 73. 2 shots then done! Due for 2nd vaccine in 2-6 months.   Pneumonia vaccines: Prevnar and Pneumovax recommended after age 50  Tetanus booster: Tdap recommended every 10 years.  Cancer screenings   Colon cancer screening: due 2024.   Breast cancer screening: mammogram recommended annually after age 12.   Cervical cancer screening: Pap due 2023  Lung cancer screening: CT chest every year for those age 35 to 55 years old with ?30 pack year smoking history, who either currently smoke or have quit within the past 15 years. Infection screenings . HIV: recommended screening at least once age 61-65, more often as needed. . Gonorrhea/Chlamydia: screening as needed, though many insurances require testing for anyone on birth control pills. . Hepatitis C: recommended for anyone born 16-1967 . TB: certain at-risk populations, or depending on work requirements and/or travel history Other . Bone Density Test: recommended for women at age 32, men at age 74, sooner depending on risk  factors . Abdominal Aortic Aneurysm: screening with ultrasound recommended once for men age 67-75 who have ever smoked

## 2019-04-28 LAB — MEASLES/MUMPS/RUBELLA IMMUNITY
Mumps IgG: <9 AU/mL — ABNORMAL LOW
Rubella: 4.64 index
Rubeola IgG: >300 AU/mL

## 2019-04-28 LAB — QUANTIFERON-TB GOLD PLUS
Mitogen-NIL: >10 IU/mL
NIL: 0.02 IU/mL
QuantiFERON-TB Gold Plus: NEGATIVE
TB1-NIL: 0 IU/mL
TB2-NIL: 0.01 IU/mL

## 2019-04-28 LAB — HEPATITIS B SURFACE ANTIBODY,QUALITATIVE: Hep B S Ab: NONREACTIVE

## 2019-04-28 LAB — VARICELLA ZOSTER ANTIBODY, IGG: Varicella IgG: >4000 index

## 2019-04-30 NOTE — Telephone Encounter (Signed)
Attempted to reach patient to schedule appointment,. Was unable to reach patient, left voicemail with information to call us back and our number.

## 2019-04-30 NOTE — Telephone Encounter (Signed)
-----  Message from Emeterio Reeve, DO sent at 04/28/2019  4:53 PM EDT ----- MyChart message sent: I got the antibody levels back and the TB testing! TB testing was negative Looks like inadequate immunity to mumps and hepatitis B. Would ned 1 dose of MMR booster and 2-dose Hep B revaccination.  If not working in a setting where there is a potential for needlestick exposure, hepatitis B infection is very unlikely and you would have the option to sign the waiver for the vaccine if desired.  I am writing this note to scheduling but in case you do not hear from them, please call the office [phone] to schedule a nurse visit for vaccines!

## 2019-05-01 ENCOUNTER — Ambulatory Visit (INDEPENDENT_AMBULATORY_CARE_PROVIDER_SITE_OTHER): Payer: BC Managed Care – PPO | Admitting: Osteopathic Medicine

## 2019-05-01 VITALS — BP 115/69 | HR 75 | Wt 180.0 lb

## 2019-05-01 DIAGNOSIS — Z23 Encounter for immunization: Secondary | ICD-10-CM | POA: Diagnosis not present

## 2019-05-01 NOTE — Progress Notes (Signed)
Pt here to get her MMR and Hep B immunizations done. Afebrile,no recent illness. Vaccinations given, pt tolerated well.  She was advised that she will need to come back in 4 weeks(05/29/2019) for next immunizations . She was given an updated copy of her NCIR.      Maryruth Eve, Lahoma Crocker, CMA

## 2019-05-01 NOTE — Progress Notes (Signed)
Contacted pt - requests to pick up the paperwork. Aware that form will be available for p/u at the front desk. Copy of form placed in scan folder.

## 2019-05-01 NOTE — Progress Notes (Signed)
I finished paperwork - see Vanicia's inbasket  Can we call pt let her know it's done, does she have a fax# to send it or would she rather pick it up or we can mail it?

## 2019-05-06 ENCOUNTER — Encounter: Payer: Self-pay | Admitting: Osteopathic Medicine

## 2019-05-12 DIAGNOSIS — F419 Anxiety disorder, unspecified: Secondary | ICD-10-CM | POA: Diagnosis not present

## 2019-05-12 DIAGNOSIS — F331 Major depressive disorder, recurrent, moderate: Secondary | ICD-10-CM | POA: Diagnosis not present

## 2019-05-29 ENCOUNTER — Other Ambulatory Visit: Payer: Self-pay

## 2019-05-29 ENCOUNTER — Ambulatory Visit: Payer: BC Managed Care – PPO | Admitting: Osteopathic Medicine

## 2019-05-29 VITALS — BP 107/74 | HR 68 | Temp 98.7°F

## 2019-05-29 DIAGNOSIS — Z23 Encounter for immunization: Secondary | ICD-10-CM

## 2019-05-29 NOTE — Progress Notes (Signed)
Patient comes in for 2nd MMR and Hepatitis B vaccines. MMR injection to right arm and Hepatitis B injection to left deltoid with no apparent complications. Patient advised to contact us with any problems. 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

## 2019-05-31 ENCOUNTER — Other Ambulatory Visit: Payer: Self-pay | Admitting: Family Medicine

## 2019-06-01 NOTE — Telephone Encounter (Signed)
[facility] requesting med refill for diazepam.

## 2019-06-02 MED ORDER — DIAZEPAM 10 MG PO TABS: 5.0000 mg | ORAL_TABLET | Freq: Every day | ORAL | 0 refills | Status: DC | PRN

## 2019-06-16 DIAGNOSIS — F411 Generalized anxiety disorder: Secondary | ICD-10-CM | POA: Diagnosis not present

## 2019-06-16 DIAGNOSIS — F331 Major depressive disorder, recurrent, moderate: Secondary | ICD-10-CM | POA: Diagnosis not present

## 2019-06-16 DIAGNOSIS — F1020 Alcohol dependence, uncomplicated: Secondary | ICD-10-CM | POA: Diagnosis not present

## 2019-06-16 DIAGNOSIS — F4010 Social phobia, unspecified: Secondary | ICD-10-CM | POA: Diagnosis not present

## 2019-06-29 DIAGNOSIS — F331 Major depressive disorder, recurrent, moderate: Secondary | ICD-10-CM | POA: Diagnosis not present

## 2019-06-29 DIAGNOSIS — F4010 Social phobia, unspecified: Secondary | ICD-10-CM | POA: Diagnosis not present

## 2019-06-29 DIAGNOSIS — F330 Major depressive disorder, recurrent, mild: Secondary | ICD-10-CM | POA: Diagnosis not present

## 2019-07-02 DIAGNOSIS — F331 Major depressive disorder, recurrent, moderate: Secondary | ICD-10-CM | POA: Diagnosis not present

## 2019-07-02 DIAGNOSIS — F1020 Alcohol dependence, uncomplicated: Secondary | ICD-10-CM | POA: Diagnosis not present

## 2019-07-02 DIAGNOSIS — F411 Generalized anxiety disorder: Secondary | ICD-10-CM | POA: Diagnosis not present

## 2019-07-23 DIAGNOSIS — F331 Major depressive disorder, recurrent, moderate: Secondary | ICD-10-CM | POA: Diagnosis not present

## 2019-07-23 DIAGNOSIS — F1020 Alcohol dependence, uncomplicated: Secondary | ICD-10-CM | POA: Diagnosis not present

## 2019-07-23 DIAGNOSIS — F411 Generalized anxiety disorder: Secondary | ICD-10-CM | POA: Diagnosis not present

## 2019-09-24 ENCOUNTER — Other Ambulatory Visit: Payer: Self-pay | Admitting: Osteopathic Medicine

## 2019-09-30 DIAGNOSIS — Z85828 Personal history of other malignant neoplasm of skin: Secondary | ICD-10-CM | POA: Diagnosis not present

## 2019-09-30 DIAGNOSIS — L700 Acne vulgaris: Secondary | ICD-10-CM | POA: Diagnosis not present

## 2019-09-30 DIAGNOSIS — D225 Melanocytic nevi of trunk: Secondary | ICD-10-CM | POA: Diagnosis not present

## 2019-10-03 ENCOUNTER — Other Ambulatory Visit: Payer: Self-pay | Admitting: Osteopathic Medicine

## 2019-11-08 ENCOUNTER — Other Ambulatory Visit: Payer: Self-pay | Admitting: Osteopathic Medicine

## 2019-11-08 NOTE — Telephone Encounter (Signed)
Requested medication (s) are due for refill today: yes  Requested medication (s) are on the active medication list: yes  Last refill:  09/25/19  Future visit scheduled: no  Notes to clinic:  Medication not delegated to NT to refill                          Routing to provider   Requested Prescriptions  Pending Prescriptions Disp Refills   diazepam (VALIUM) 10 MG tablet [Pharmacy Med Name: DIAZEPAM 10 MG TABLET] 30 tablet 0    Sig: TAKE 0.5-1 TABLETS (5-10 MG TOTAL) BY MOUTH DAILY AS NEEDED FOR ANXIETY.      Not Delegated - Psychiatry:  Anxiolytics/Hypnotics Failed - 11/08/2019 11:44 AM      Failed - This refill cannot be delegated      Failed - Urine Drug Screen completed in last 360 days.      Failed - Valid encounter within last 6 months    Recent Outpatient Visits           5 months ago Need for MMR vaccine   [facility], Lanelle Bal, DO   6 months ago Need for MMR vaccine   [facility] [place], Lanelle Bal, DO   6 months ago Annual physical exam   [facility], Lanelle Bal, DO   9 months ago Familial hypercholesterolemia   [facility], Lanelle Bal, DO   1 year ago Moderate episode of recurrent major depressive disorder [facility])    [facility], Lanelle Bal, DO

## 2019-12-11 ENCOUNTER — Encounter: Payer: Self-pay | Admitting: Osteopathic Medicine

## 2019-12-11 NOTE — Telephone Encounter (Signed)
Routing to provider. Last written on 11/09/19. Rx pended.

## 2019-12-14 MED ORDER — DIAZEPAM 10 MG PO TABS: 5.0000 mg | ORAL_TABLET | Freq: Every day | ORAL | 2 refills | Status: DC | PRN

## 2019-12-18 ENCOUNTER — Encounter: Payer: Self-pay | Admitting: Osteopathic Medicine

## 2019-12-28 ENCOUNTER — Other Ambulatory Visit: Payer: Self-pay | Admitting: Osteopathic Medicine

## 2019-12-31 ENCOUNTER — Ambulatory Visit (INDEPENDENT_AMBULATORY_CARE_PROVIDER_SITE_OTHER): Payer: BC Managed Care – PPO

## 2019-12-31 ENCOUNTER — Encounter: Payer: Self-pay | Admitting: Osteopathic Medicine

## 2019-12-31 ENCOUNTER — Other Ambulatory Visit: Payer: Self-pay

## 2019-12-31 ENCOUNTER — Ambulatory Visit (INDEPENDENT_AMBULATORY_CARE_PROVIDER_SITE_OTHER): Payer: BC Managed Care – PPO | Admitting: Sports Medicine

## 2019-12-31 DIAGNOSIS — M25521 Pain in right elbow: Secondary | ICD-10-CM | POA: Insufficient documentation

## 2019-12-31 MED ORDER — MELOXICAM 15 MG PO TABS: ORAL_TABLET | ORAL | 3 refills | Status: DC

## 2019-12-31 NOTE — Progress Notes (Signed)
    Procedures performed today:    None.  Independent interpretation of notes and tests performed by another provider:   None.  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 No trauma. On exam she really does not have any tenderness the common extensor tendon origin, more at the distal biceps and the brachioradialis. I think she has more of a brachioradialis tendinitis versus a distal biceps tendinitis. She will wear an elbow sleeve, adding tennis elbow rehab exercises, meloxicam, x-rays. Return to see me in 1 month, MRI if no better.    ___________________________________________ Ihor Austin. Benjamin Stain, M.D., ABFM., CAQSM. Primary Care and Sports Medicine [facility]  Adjunct Instructor of Family Medicine  [employer]

## 2019-12-31 NOTE — Assessment & Plan Note (Addendum)
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 No trauma. On exam she really does not have any tenderness the common extensor tendon origin, more at the distal biceps and the brachioradialis. I think she has more of a brachioradialis tendinitis versus a distal biceps tendinitis. She will wear an elbow sleeve, adding tennis elbow rehab exercises, meloxicam, x-rays. Return to see me in 1 month, MRI if no better.

## 2020-01-10 ENCOUNTER — Other Ambulatory Visit: Payer: Self-pay | Admitting: Osteopathic Medicine

## 2020-01-20 DIAGNOSIS — Z8639 Personal history of other endocrine, nutritional and metabolic disease: Secondary | ICD-10-CM | POA: Diagnosis not present

## 2020-01-20 DIAGNOSIS — R5383 Other fatigue: Secondary | ICD-10-CM | POA: Diagnosis not present

## 2020-01-20 DIAGNOSIS — N951 Menopausal and female climacteric states: Secondary | ICD-10-CM | POA: Diagnosis not present

## 2020-01-20 DIAGNOSIS — E559 Vitamin D deficiency, unspecified: Secondary | ICD-10-CM | POA: Diagnosis not present

## 2020-01-20 DIAGNOSIS — E538 Deficiency of other specified B group vitamins: Secondary | ICD-10-CM | POA: Diagnosis not present

## 2020-01-28 ENCOUNTER — Ambulatory Visit: Payer: BC Managed Care – PPO | Admitting: Sports Medicine

## 2020-02-13 ENCOUNTER — Other Ambulatory Visit: Payer: Self-pay | Admitting: Osteopathic Medicine

## 2020-03-02 ENCOUNTER — Ambulatory Visit (INDEPENDENT_AMBULATORY_CARE_PROVIDER_SITE_OTHER): Payer: BC Managed Care – PPO | Admitting: Sports Medicine

## 2020-03-02 ENCOUNTER — Ambulatory Visit (INDEPENDENT_AMBULATORY_CARE_PROVIDER_SITE_OTHER): Payer: BC Managed Care – PPO

## 2020-03-02 ENCOUNTER — Encounter: Payer: Self-pay | Admitting: Sports Medicine

## 2020-03-02 DIAGNOSIS — M151 Heberden's nodes (with arthropathy): Secondary | ICD-10-CM | POA: Diagnosis not present

## 2020-03-02 DIAGNOSIS — M79641 Pain in right hand: Secondary | ICD-10-CM | POA: Diagnosis not present

## 2020-03-02 IMAGING — DX DG HAND COMPLETE 3+V*R*
3 series · 3 of 3 positions shown · non-contrast
Comparison: None.

CLINICAL DATA: Right hand pain for 2 months without known injury.

EXAM:
RIGHT HAND - COMPLETE 3+ VIEW

[hand pa]
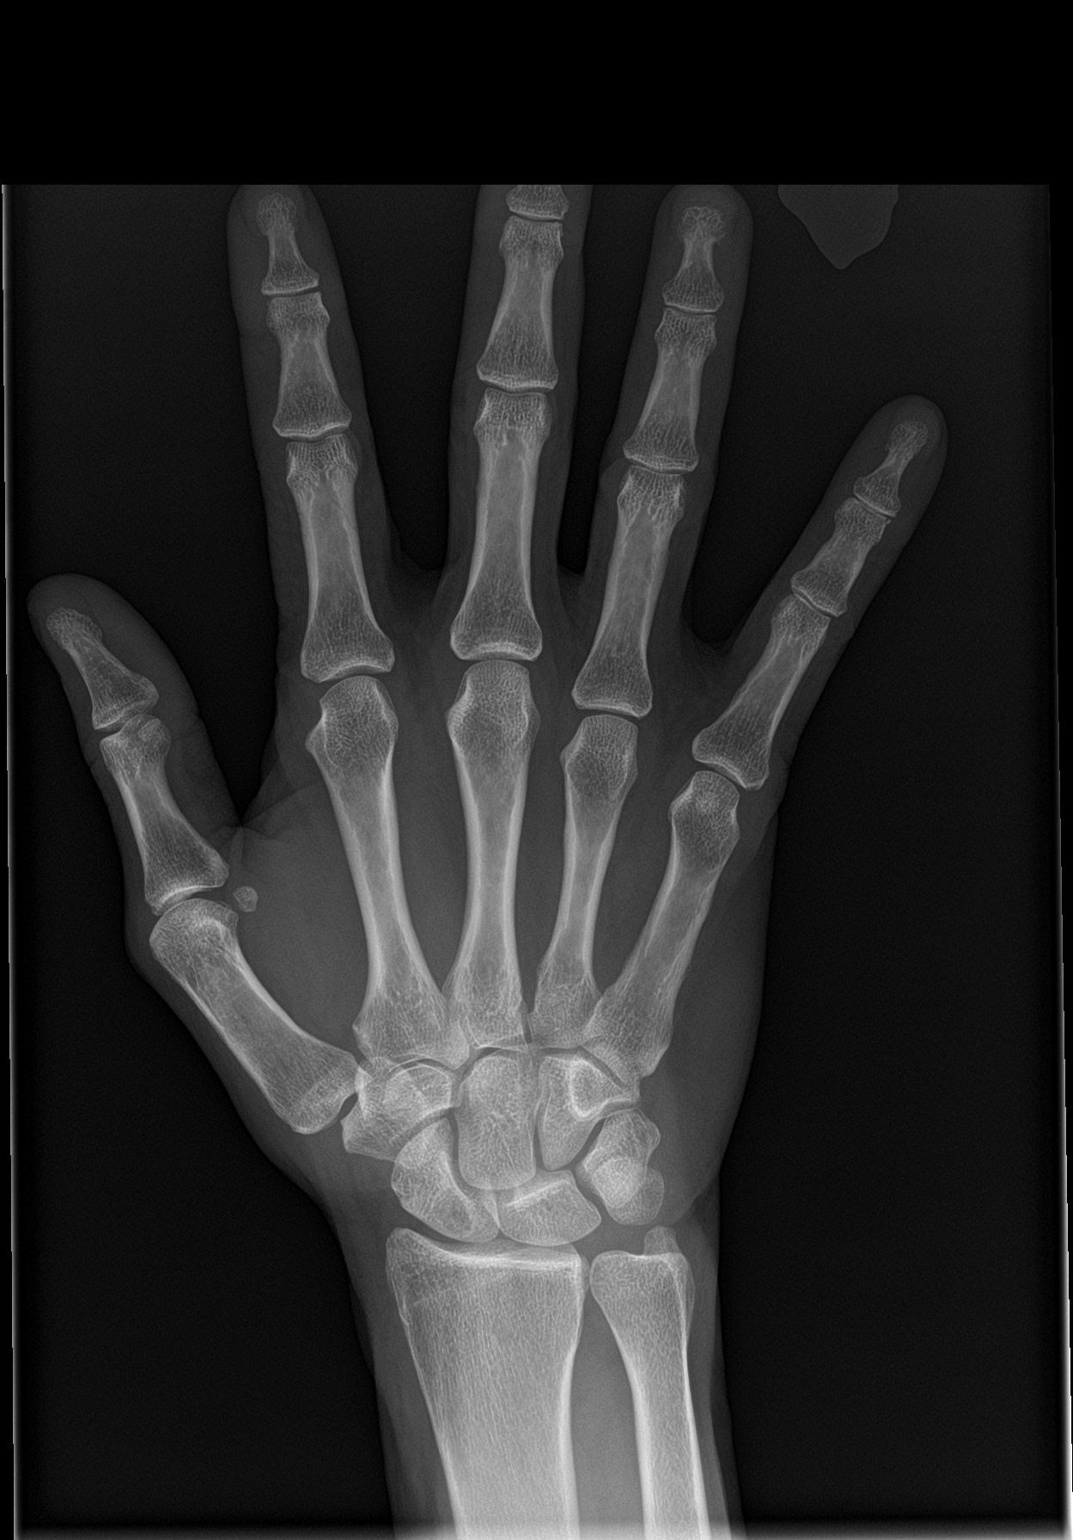

[hand obl]
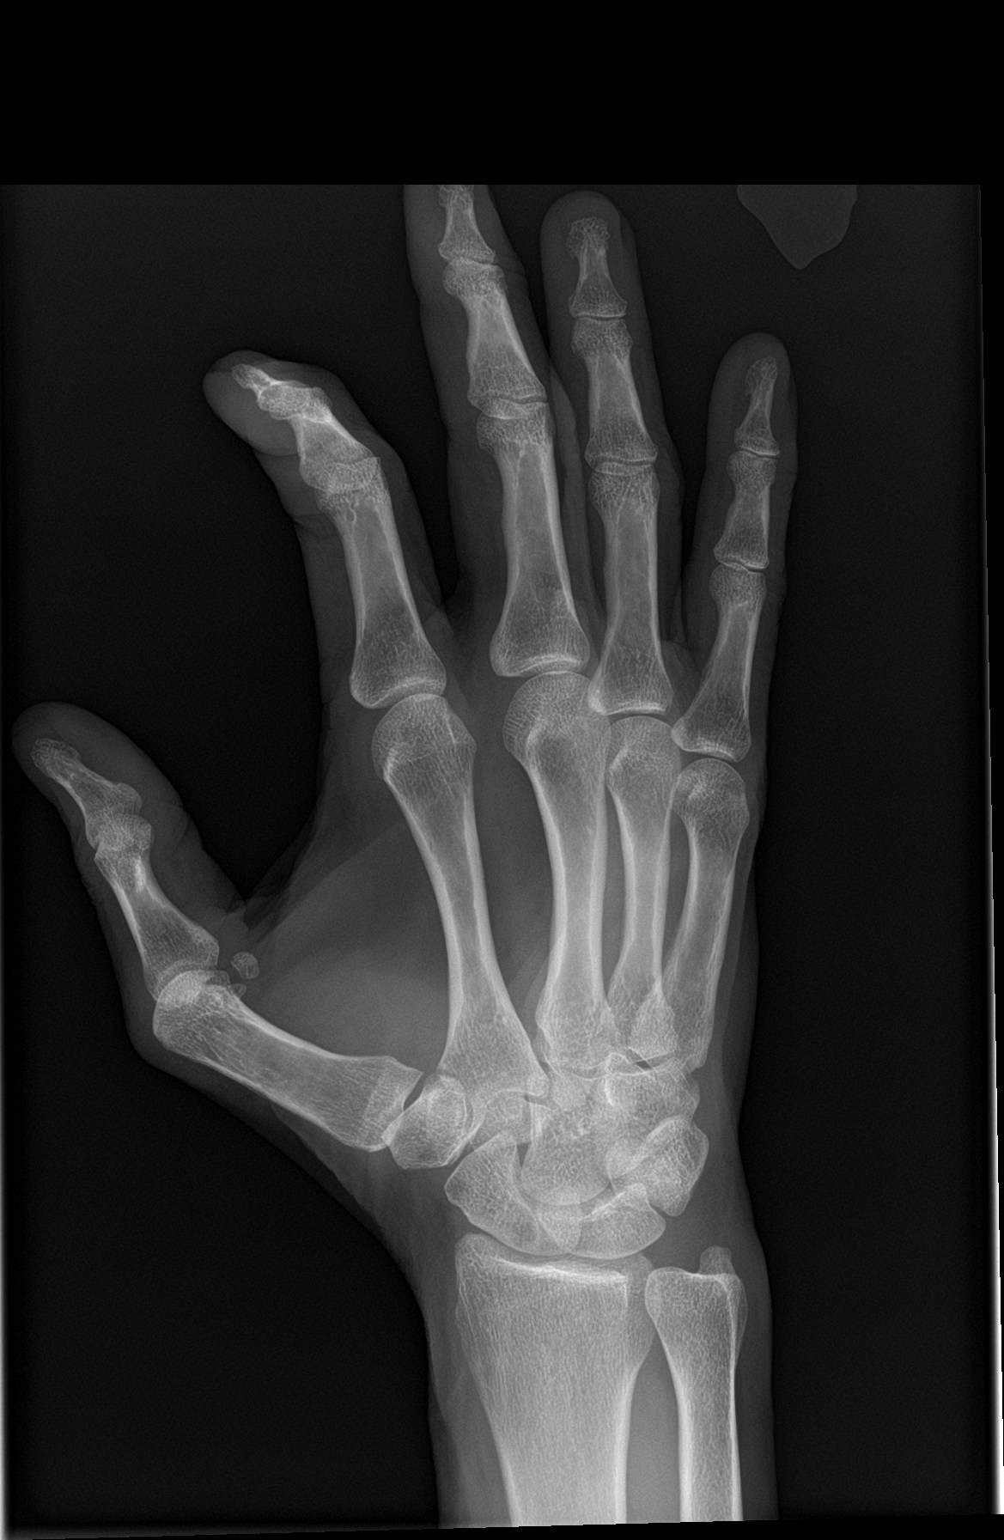

[hand lat]
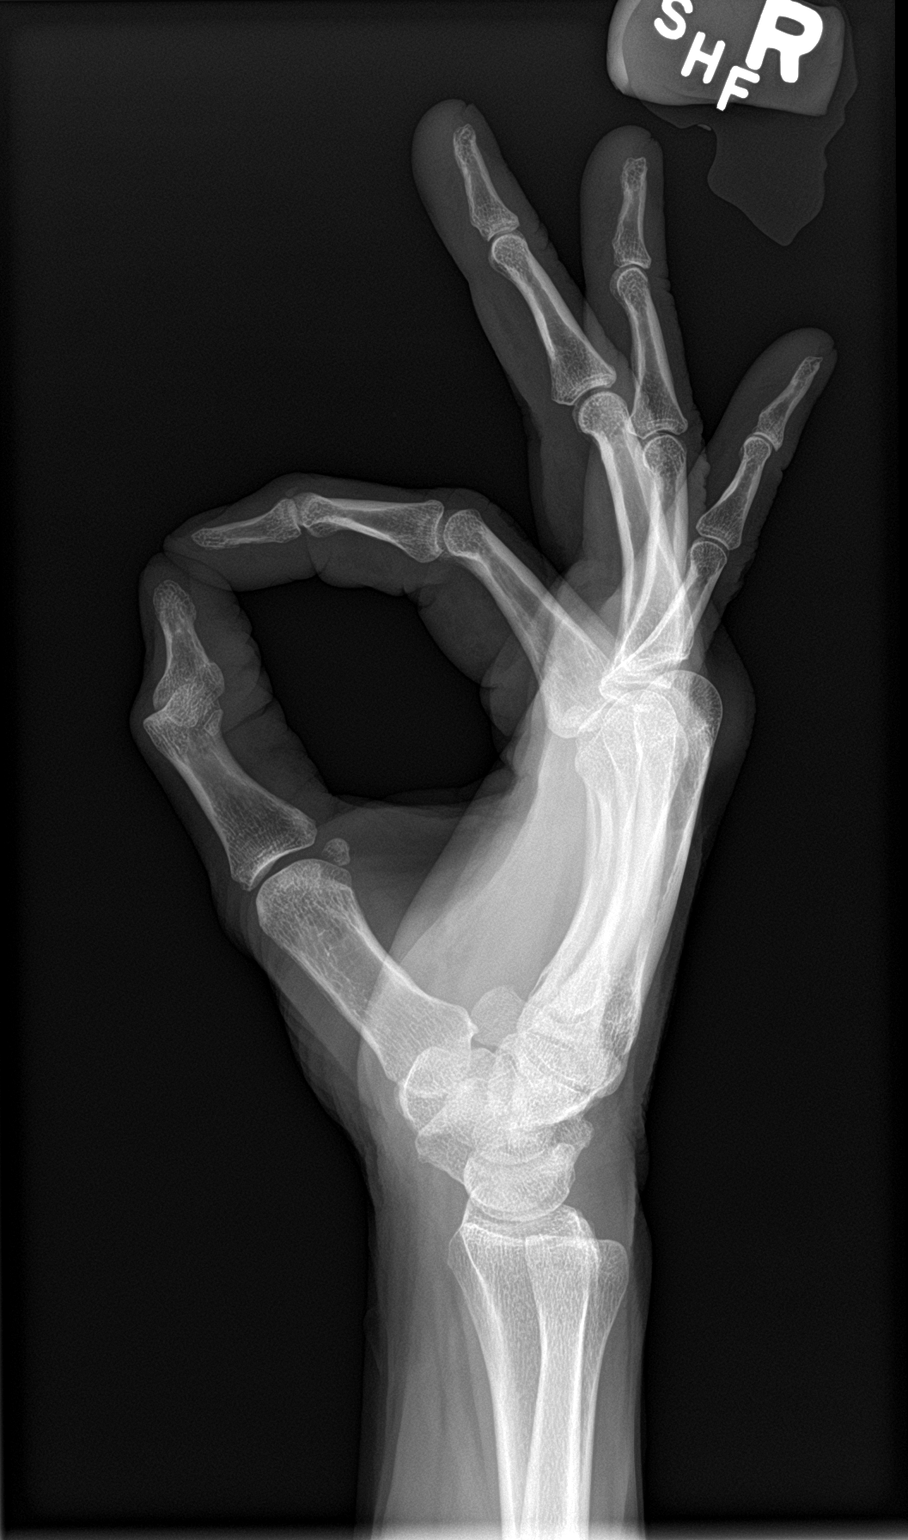

[3 of 3 positions shown; findings below may reference images not displayed]

FINDINGS: There is no evidence of fracture or dislocation. There is no
evidence of arthropathy or other focal bone abnormality. Soft
tissues are unremarkable.
IMPRESSION: Negative.

## 2020-03-02 NOTE — Progress Notes (Signed)
    Procedures performed today:    None.  Independent interpretation of notes and tests performed by another provider:   None.  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 On exam there is minimal tenderness, she has good motion, good strength, she does have bilateral Bouchard and Heberden nodes, I think she simply has mild osteoarthritis, adding topical Pennsaid, x-rays, home rehab exercises. Return to see me if no better or insufficient improvement in a month and we will start an oral NSAID. Certainly intra-articular injections may be in the future.    ___________________________________________ Ihor Austin. Benjamin Stain, M.D., ABFM., CAQSM. Primary Care and Sports Medicine [facility]  Adjunct Instructor of Family Medicine  [employer]

## 2020-03-02 NOTE — Assessment & Plan Note (Signed)
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 On exam there is minimal tenderness, she has good motion, good strength, she does have bilateral Bouchard and Heberden nodes, I think she simply has mild osteoarthritis, adding topical Pennsaid, x-rays, home rehab exercises. Return to see me if no better or insufficient improvement in a month and we will start an oral NSAID. Certainly intra-articular injections may be in the future.

## 2020-03-14 ENCOUNTER — Encounter: Payer: Self-pay | Admitting: Osteopathic Medicine

## 2020-03-16 ENCOUNTER — Ambulatory Visit (INDEPENDENT_AMBULATORY_CARE_PROVIDER_SITE_OTHER): Payer: BC Managed Care – PPO | Admitting: Osteopathic Medicine

## 2020-03-16 ENCOUNTER — Encounter: Payer: Self-pay | Admitting: Osteopathic Medicine

## 2020-03-16 VITALS — BP 120/78 | HR 56 | Wt 170.0 lb

## 2020-03-16 DIAGNOSIS — F331 Major depressive disorder, recurrent, moderate: Secondary | ICD-10-CM | POA: Diagnosis not present

## 2020-03-16 MED ORDER — LAMOTRIGINE 100 MG PO TABS: 100.0000 mg | ORAL_TABLET | Freq: Every day | ORAL | 1 refills | Status: DC

## 2020-03-16 MED ORDER — VORTIOXETINE HBR 10 MG PO TABS: 10.0000 mg | ORAL_TABLET | Freq: Every day | ORAL | 0 refills | Status: DC

## 2020-03-16 NOTE — Patient Instructions (Addendum)
Will switch Cymbalta to Trintellix  STOP Cymbalta  Take new Rx Trintellix next day   See savings card info: https://[email or web address]/savings-support May need to go through authorization process   Will refer for therapy   For immediate mental health services if needed:  [facility], [address], [place], [place] [address], [phone]  [facility], [address], [place], [place] [address], [phone]  Any emergency room, 911  National Suicide Prevention [place], [phone]

## 2020-03-16 NOTE — Progress Notes (Signed)
KATRYN PLUMMER is a 54 y.o. female who presents to  [facility] at [facility]  today, 03/17/20, seeking care for the following:  . MENTAL HEALTH - depression worse, unable to see psychiatry   Depression screen [facility] 2/9 03/16/2020 04/24/2019 01/16/2019  Decreased Interest 3 0 1  Down, Depressed, Hopeless 3 0 1  PHQ - 2 Score 6 0 2  Altered sleeping 3 - 1  Tired, decreased energy 3 - 2  Change in appetite 2 - 1  Feeling bad or failure about yourself  3 - 2  Trouble concentrating 3 - 2  Moving slowly or fidgety/restless 3 - 2  Suicidal thoughts 1 - 0  PHQ-9 Score 24 - 12  Difficult doing work/chores Extremely dIfficult - Somewhat difficult   GAD 7 : Generalized Anxiety Score 03/16/2020 04/24/2019 01/16/2019 11/04/2018  Nervous, Anxious, on Edge 2 0 1 1  Control/stop worrying 2 0 1 1  Worry too much - different things 3 0 2 1  Trouble relaxing 3 0 2 2  Restless 3 0 0 1  Easily annoyed or irritable 3 0 0 3  Afraid - awful might happen 3 0 0 2  Total GAD 7 Score 19 0 6 11  Anxiety Difficulty Extremely difficult Not difficult at all Somewhat difficult Somewhat difficult        ASSESSMENT & PLAN with other pertinent findings:  The encounter diagnosis was Moderate episode of recurrent major depressive disorder (HCC).   No results found for this or any previous visit (from the past 24 hour(s)).     Patient Instructions  Will switch Cymbalta to Trintellix  STOP Cymbalta  Take new Rx Trintellix next day   See savings card info: https://[email or web address]/savings-support May need to go through authorization process   Will refer for therapy   For immediate mental health services if needed:  [facility], [address], [place], [place] [address], [phone]  [facility], [address] [place], [place] [address], [phone]  Any emergency room, 911  National Suicide Prevention  [place], [phone]      Orders Placed This Encounter  Procedures  . Ambulatory referral to Behavioral Health    Meds ordered this encounter  Medications  . lamoTRIgine (LAMICTAL) 100 MG tablet    Sig: Take 1 tablet (100 mg total) by mouth daily.    Dispense:  90 tablet    Refill:  1  . vortioxetine HBr (TRINTELLIX) 10 MG TABS tablet    Sig: Take 1 tablet (10 mg total) by mouth daily.    Dispense:  90 tablet    Refill:  0    If PA needed, has tried and not tolerated or has failed wellbutrin, cymbalta, lamotrigine, other SSRI. Pt will activate savings card       Follow-up instructions: Return for 4 weeks after starting Trintellix .                                         BP 120/78 (BP Location: Left Arm, Patient Position: Sitting)   Pulse 56   Wt 170 lb (77.1 kg)   LMP 04/21/2014 (Approximate)   SpO2 97%   BMI 29.18 kg/m   Current Meds  Medication Sig  . diazepam (VALIUM) 10 MG tablet Take 0.5-1 tablets (5-10 mg total) by mouth daily as needed for anxiety (#30  for 30 days, try to make this last longer to avoid risk of tolerance & dependence).  Marland Kitchen estradiol (ESTRACE) 0.5 MG tablet TAKE 1 TABLET BY MOUTH EVERY DAY  . lamoTRIgine (LAMICTAL) 100 MG tablet Take 1 tablet (100 mg total) by mouth daily.  . progesterone (PROMETRIUM) 100 MG capsule TAKE 1-2 CAPSULES (100-200 MG TOTAL) BY MOUTH DAILY.  . [DISCONTINUED] DULoxetine (CYMBALTA) 60 MG capsule TAKE 1 CAPSULE BY MOUTH EVERY DAY  . [DISCONTINUED] lamoTRIgine (LAMICTAL) 100 MG tablet Take 100 mg by mouth daily.    No results found for this or any previous visit (from the past 72 hour(s)).  No results found.     All questions at time of visit were answered - patient instructed to contact office with any additional concerns or updates.  ER/RTC precautions were reviewed with the patient as applicable.   Please note: voice recognition software was used to produce this  document, and typos may escape review. Please contact Dr. Lyn Hollingshead for any needed clarifications.

## 2020-03-17 ENCOUNTER — Encounter: Payer: Self-pay | Admitting: Osteopathic Medicine

## 2020-03-20 ENCOUNTER — Encounter: Payer: Self-pay | Admitting: Osteopathic Medicine

## 2020-03-22 MED ORDER — BUPROPION HCL ER (XL) 150 MG PO TB24: 150.0000 mg | ORAL_TABLET | Freq: Every day | ORAL | 1 refills | Status: DC

## 2020-03-22 NOTE — Telephone Encounter (Signed)
Routing to PCP

## 2020-03-25 ENCOUNTER — Encounter: Payer: Self-pay | Admitting: Osteopathic Medicine

## 2020-03-30 ENCOUNTER — Telehealth: Payer: Self-pay

## 2020-03-30 DIAGNOSIS — Z111 Encounter for screening for respiratory tuberculosis: Secondary | ICD-10-CM

## 2020-03-30 NOTE — Telephone Encounter (Signed)
MyChart message was sent wanting a Quantiferon-TB test. Ordered per Dr Lyn Hollingshead. Left a message advising of lab order and lab schedule.

## 2020-04-11 ENCOUNTER — Encounter: Payer: Self-pay | Admitting: Osteopathic Medicine

## 2020-04-11 ENCOUNTER — Telehealth: Payer: Self-pay | Admitting: Osteopathic Medicine

## 2020-04-11 NOTE — Telephone Encounter (Signed)
Called patient and left her a voicemail with this information.

## 2020-04-11 NOTE — Telephone Encounter (Signed)
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 Okay to schedule on [place]?

## 2020-04-11 NOTE — Telephone Encounter (Signed)
Per the phone note on 8/11 it shows that patient has active lab orders for the TB titer blood test.   Does she not want the blood test anymore?

## 2020-04-12 MED ORDER — SERTRALINE HCL 50 MG PO TABS
ORAL_TABLET | ORAL | 1 refills | Status: DC
Start: 2020-04-12 — End: 2020-06-02

## 2020-04-17 LAB — QUANTIFERON-TB GOLD PLUS
Mitogen-NIL: >10 IU/mL
NIL: 0.05 IU/mL
QuantiFERON-TB Gold Plus: NEGATIVE
TB1-NIL: 0 IU/mL
TB2-NIL: <0 IU/mL

## 2020-04-19 ENCOUNTER — Ambulatory Visit (INDEPENDENT_AMBULATORY_CARE_PROVIDER_SITE_OTHER): Payer: BC Managed Care – PPO | Admitting: Professional

## 2020-04-19 DIAGNOSIS — F4310 Post-traumatic stress disorder, unspecified: Secondary | ICD-10-CM

## 2020-04-20 ENCOUNTER — Ambulatory Visit: Payer: BC Managed Care – PPO | Admitting: Osteopathic Medicine

## 2020-04-21 ENCOUNTER — Ambulatory Visit (INDEPENDENT_AMBULATORY_CARE_PROVIDER_SITE_OTHER): Payer: BC Managed Care – PPO | Admitting: Professional

## 2020-04-21 DIAGNOSIS — F4310 Post-traumatic stress disorder, unspecified: Secondary | ICD-10-CM | POA: Diagnosis not present

## 2020-04-24 ENCOUNTER — Other Ambulatory Visit: Payer: Self-pay | Admitting: Sports Medicine

## 2020-04-24 DIAGNOSIS — M25521 Pain in right elbow: Secondary | ICD-10-CM

## 2020-04-26 ENCOUNTER — Ambulatory Visit: Payer: BC Managed Care – PPO | Admitting: Professional

## 2020-05-03 ENCOUNTER — Ambulatory Visit (INDEPENDENT_AMBULATORY_CARE_PROVIDER_SITE_OTHER): Payer: BC Managed Care – PPO | Admitting: Professional

## 2020-05-03 DIAGNOSIS — F4310 Post-traumatic stress disorder, unspecified: Secondary | ICD-10-CM

## 2020-05-04 ENCOUNTER — Encounter: Payer: Self-pay | Admitting: Osteopathic Medicine

## 2020-05-04 ENCOUNTER — Other Ambulatory Visit: Payer: Self-pay | Admitting: Osteopathic Medicine

## 2020-05-10 ENCOUNTER — Other Ambulatory Visit: Payer: Self-pay | Admitting: Osteopathic Medicine

## 2020-05-17 ENCOUNTER — Ambulatory Visit: Payer: BC Managed Care – PPO | Admitting: Professional

## 2020-05-27 MED ORDER — BUPROPION HCL ER (XL) 300 MG PO TB24
300.0000 mg | ORAL_TABLET | Freq: Every day | ORAL | 1 refills | Status: DC
Start: 2020-05-27 — End: 2020-09-19

## 2020-05-27 NOTE — Addendum Note (Signed)
Addended by: Deirdre Pippins on: 05/27/2020 03:56 PM   Modules accepted: Orders

## 2020-05-31 ENCOUNTER — Ambulatory Visit (INDEPENDENT_AMBULATORY_CARE_PROVIDER_SITE_OTHER): Payer: BC Managed Care – PPO | Admitting: Professional

## 2020-05-31 ENCOUNTER — Telehealth (HOSPITAL_COMMUNITY): Payer: Self-pay | Admitting: Psychiatry

## 2020-05-31 DIAGNOSIS — F4310 Post-traumatic stress disorder, unspecified: Secondary | ICD-10-CM | POA: Diagnosis not present

## 2020-06-01 ENCOUNTER — Telehealth (HOSPITAL_COMMUNITY): Payer: Self-pay | Admitting: Psychiatry

## 2020-06-01 ENCOUNTER — Other Ambulatory Visit (HOSPITAL_COMMUNITY): Payer: BC Managed Care – PPO | Attending: Psychiatry | Admitting: Psychiatry

## 2020-06-01 ENCOUNTER — Other Ambulatory Visit: Payer: Self-pay

## 2020-06-01 DIAGNOSIS — F4310 Post-traumatic stress disorder, unspecified: Secondary | ICD-10-CM | POA: Insufficient documentation

## 2020-06-01 DIAGNOSIS — F16183 Hallucinogen abuse with hallucinogen persisting perception disorder (flashbacks): Secondary | ICD-10-CM | POA: Insufficient documentation

## 2020-06-01 DIAGNOSIS — R454 Irritability and anger: Secondary | ICD-10-CM | POA: Insufficient documentation

## 2020-06-01 DIAGNOSIS — F332 Major depressive disorder, recurrent severe without psychotic features: Secondary | ICD-10-CM | POA: Insufficient documentation

## 2020-06-01 DIAGNOSIS — R45851 Suicidal ideations: Secondary | ICD-10-CM | POA: Insufficient documentation

## 2020-06-01 DIAGNOSIS — Z9151 Personal history of suicidal behavior: Secondary | ICD-10-CM | POA: Insufficient documentation

## 2020-06-01 DIAGNOSIS — F419 Anxiety disorder, unspecified: Secondary | ICD-10-CM | POA: Insufficient documentation

## 2020-06-01 DIAGNOSIS — F1010 Alcohol abuse, uncomplicated: Secondary | ICD-10-CM | POA: Insufficient documentation

## 2020-06-01 DIAGNOSIS — Z91410 Personal history of adult physical and sexual abuse: Secondary | ICD-10-CM | POA: Insufficient documentation

## 2020-06-01 DIAGNOSIS — Z79899 Other long term (current) drug therapy: Secondary | ICD-10-CM | POA: Insufficient documentation

## 2020-06-01 DIAGNOSIS — Z87891 Personal history of nicotine dependence: Secondary | ICD-10-CM | POA: Insufficient documentation
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
## 2020-06-02 ENCOUNTER — Other Ambulatory Visit (HOSPITAL_COMMUNITY): Payer: BC Managed Care – PPO | Admitting: Psychiatry

## 2020-06-02 ENCOUNTER — Other Ambulatory Visit: Payer: Self-pay | Admitting: Osteopathic Medicine

## 2020-06-02 ENCOUNTER — Other Ambulatory Visit: Payer: Self-pay

## 2020-06-02 ENCOUNTER — Encounter (HOSPITAL_COMMUNITY): Payer: Self-pay

## 2020-06-02 DIAGNOSIS — R454 Irritability and anger: Secondary | ICD-10-CM | POA: Diagnosis not present

## 2020-06-02 DIAGNOSIS — F331 Major depressive disorder, recurrent, moderate: Secondary | ICD-10-CM

## 2020-06-02 DIAGNOSIS — F332 Major depressive disorder, recurrent severe without psychotic features: Secondary | ICD-10-CM | POA: Diagnosis not present

## 2020-06-02 DIAGNOSIS — Z9151 Personal history of suicidal behavior: Secondary | ICD-10-CM | POA: Diagnosis not present

## 2020-06-02 DIAGNOSIS — F4310 Post-traumatic stress disorder, unspecified: Secondary | ICD-10-CM | POA: Diagnosis not present

## 2020-06-02 DIAGNOSIS — F1010 Alcohol abuse, uncomplicated: Secondary | ICD-10-CM | POA: Diagnosis not present

## 2020-06-02 DIAGNOSIS — Z91410 Personal history of adult physical and sexual abuse: Secondary | ICD-10-CM | POA: Diagnosis not present

## 2020-06-02 DIAGNOSIS — F16183 Hallucinogen abuse with hallucinogen persisting perception disorder (flashbacks): Secondary | ICD-10-CM | POA: Diagnosis not present

## 2020-06-02 DIAGNOSIS — R45851 Suicidal ideations: Secondary | ICD-10-CM | POA: Diagnosis not present

## 2020-06-02 DIAGNOSIS — F419 Anxiety disorder, unspecified: Secondary | ICD-10-CM | POA: Diagnosis not present

## 2020-06-02 DIAGNOSIS — Z87891 Personal history of nicotine dependence: Secondary | ICD-10-CM | POA: Diagnosis not present

## 2020-06-02 DIAGNOSIS — Z79899 Other long term (current) drug therapy: Secondary | ICD-10-CM | POA: Diagnosis not present

## 2020-06-02 NOTE — Progress Notes (Signed)
Virtual Visit via Video Note  I connected with [name] on 06/02/20 at  9:00 AM EDT by a video enabled telemedicine application and verified that I am speaking with the correct person using two identifiers.  At orientation to the IOP program, Case Manager discussed the limitations of evaluation and management by telemedicine and the availability of in person appointments. The patient expressed understanding and agreed to proceed with virtual visits throughout the duration of the program.   Location:  Patient: Patient Home Provider: Home Office   History of Present Illness: MDD  Observations/Objective: Check In: Case Manager checked in with all participants to review discharge dates, insurance authorizations, work-related documents and needs of the treatment team, including medication review and assessment. Counselor facilitated therapeutic processing with group members to assess mood and current functioning, prompting group members to share about application of skills, progress and challenges in treatment/personal lives. Today is the client's first session with the group. Client shared about what brought her to treatment, what she is hoping to get out of treatment, who is in her support system and about current life stressors. Client made note of recent suicide attempt via overdose, "life-long" depression, grief and loss issues and PTSD triggers. Client presents with severe depression and moderate anxiety. Client denied any current SI/HI/psychosis. No current, but recent history.   Initial Therapeutic Activity: Counselor introduced Sheppard Coil, [place] Chaplain to present information and discussion on Grief and Loss. Group members engaged in discussion, sharing how grief impacts them, what comforts them, what emotions are felt, labeling losses, etc. After guest speaker logged off, Counselor prompted group to spend 10-15 minutes journaling to process personal grief and loss situations.  Counselor processed entries with group and clients identified areas for additional processing in individual therapy. Client noted loss related to her parents passing several years ago and the ripple effects within her family.   Second Therapeutic Activity: Counselor introduced [employer], representative with [facility] [place] to share about programming. Group Members asked questions and engaged in discussion, as Trinna Post shared about Peer Support, Support Groups and the [employer]. Client stated that they are interested in connecting with the Wellness Recovery Action Plan Course.  Check Out: Counselor prompted group members to share their go to coping skills when triggered. Client stated that she baking, shopping, prayer, crying, watching comedies and drinking wine. Counselor to continue assessment of alcohol habits. Client indicated that they are not currently a risk to self of others before signing off.  Assessment and Plan: Clinician recommends that Client remain in IOP treatment to better manage mental health symptoms, stabilization and to address treatment plan goals. Clinician recommends adherence to crisis/safety plan, taking medications as prescribed, and following up with medical professionals if any issues arise.   Follow Up Instructions: Clinician will send Webex link for next session. The Client was advised to call back or seek an in-person evaluation if the symptoms worsen or if the condition fails to improve as anticipated.     I provided 180 minutes of non-face-to-face time during this encounter.     Hilbert Odor, LCSW

## 2020-06-03 ENCOUNTER — Other Ambulatory Visit: Payer: Self-pay

## 2020-06-03 ENCOUNTER — Other Ambulatory Visit (HOSPITAL_BASED_OUTPATIENT_CLINIC_OR_DEPARTMENT_OTHER): Payer: BC Managed Care – PPO | Admitting: Family

## 2020-06-03 ENCOUNTER — Telehealth (HOSPITAL_COMMUNITY): Payer: Self-pay | Admitting: Psychiatry

## 2020-06-03 ENCOUNTER — Encounter (HOSPITAL_COMMUNITY): Payer: Self-pay | Admitting: Family

## 2020-06-03 ENCOUNTER — Ambulatory Visit (HOSPITAL_COMMUNITY): Payer: BC Managed Care – PPO

## 2020-06-03 DIAGNOSIS — R454 Irritability and anger: Secondary | ICD-10-CM | POA: Diagnosis not present

## 2020-06-03 DIAGNOSIS — F331 Major depressive disorder, recurrent, moderate: Secondary | ICD-10-CM | POA: Diagnosis not present

## 2020-06-03 DIAGNOSIS — F332 Major depressive disorder, recurrent severe without psychotic features: Secondary | ICD-10-CM | POA: Diagnosis not present

## 2020-06-03 DIAGNOSIS — R45851 Suicidal ideations: Secondary | ICD-10-CM | POA: Diagnosis not present

## 2020-06-03 DIAGNOSIS — Z91410 Personal history of adult physical and sexual abuse: Secondary | ICD-10-CM | POA: Diagnosis not present

## 2020-06-03 DIAGNOSIS — Z9151 Personal history of suicidal behavior: Secondary | ICD-10-CM | POA: Diagnosis not present

## 2020-06-03 DIAGNOSIS — F4310 Post-traumatic stress disorder, unspecified: Secondary | ICD-10-CM | POA: Diagnosis not present

## 2020-06-03 DIAGNOSIS — Z79899 Other long term (current) drug therapy: Secondary | ICD-10-CM | POA: Diagnosis not present

## 2020-06-03 DIAGNOSIS — F16183 Hallucinogen abuse with hallucinogen persisting perception disorder (flashbacks): Secondary | ICD-10-CM | POA: Diagnosis not present

## 2020-06-03 DIAGNOSIS — F419 Anxiety disorder, unspecified: Secondary | ICD-10-CM | POA: Diagnosis not present

## 2020-06-03 DIAGNOSIS — F1010 Alcohol abuse, uncomplicated: Secondary | ICD-10-CM | POA: Diagnosis not present

## 2020-06-03 DIAGNOSIS — Z87891 Personal history of nicotine dependence: Secondary | ICD-10-CM | POA: Diagnosis not present

## 2020-06-03 NOTE — Telephone Encounter (Signed)
D:  Attempted to reach patient because the group leader forwarded an email from pt stating that she wanted to end MH-IOP.  States she wants to find a group with people her age and similar trauma.  A:  Left pt a vm encouraging her to log in on 06-06-20 and to discuss with her therapist before making the decision to end group.  Inform Hillery Jacks, NP.
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
## 2020-06-06 ENCOUNTER — Telehealth (HOSPITAL_COMMUNITY): Payer: Self-pay | Admitting: Psychiatry

## 2020-06-06 ENCOUNTER — Other Ambulatory Visit: Payer: Self-pay

## 2020-06-06 ENCOUNTER — Other Ambulatory Visit (HOSPITAL_COMMUNITY): Payer: BC Managed Care – PPO | Admitting: Psychiatry

## 2020-06-06 ENCOUNTER — Encounter (HOSPITAL_COMMUNITY): Payer: Self-pay | Admitting: Psychiatry

## 2020-06-06 DIAGNOSIS — R45851 Suicidal ideations: Secondary | ICD-10-CM | POA: Diagnosis not present

## 2020-06-06 DIAGNOSIS — Z87891 Personal history of nicotine dependence: Secondary | ICD-10-CM | POA: Diagnosis not present

## 2020-06-06 DIAGNOSIS — F16183 Hallucinogen abuse with hallucinogen persisting perception disorder (flashbacks): Secondary | ICD-10-CM | POA: Diagnosis not present

## 2020-06-06 DIAGNOSIS — F4310 Post-traumatic stress disorder, unspecified: Secondary | ICD-10-CM

## 2020-06-06 DIAGNOSIS — Z91410 Personal history of adult physical and sexual abuse: Secondary | ICD-10-CM | POA: Diagnosis not present

## 2020-06-06 DIAGNOSIS — F419 Anxiety disorder, unspecified: Secondary | ICD-10-CM | POA: Diagnosis not present

## 2020-06-06 DIAGNOSIS — F1010 Alcohol abuse, uncomplicated: Secondary | ICD-10-CM | POA: Diagnosis not present

## 2020-06-06 DIAGNOSIS — R454 Irritability and anger: Secondary | ICD-10-CM | POA: Diagnosis not present

## 2020-06-06 DIAGNOSIS — F332 Major depressive disorder, recurrent severe without psychotic features: Secondary | ICD-10-CM | POA: Diagnosis not present

## 2020-06-06 DIAGNOSIS — Z79899 Other long term (current) drug therapy: Secondary | ICD-10-CM | POA: Diagnosis not present

## 2020-06-06 DIAGNOSIS — Z9151 Personal history of suicidal behavior: Secondary | ICD-10-CM | POA: Diagnosis not present

## 2020-06-06 DIAGNOSIS — F331 Major depressive disorder, recurrent, moderate: Secondary | ICD-10-CM

## 2020-06-06 NOTE — Progress Notes (Signed)
Virtual Visit via Video Note  I connected with Renee Norris on 06/06/20 at  9:00 AM EDT by a video enabled telemedicine application and verified that I am speaking with the correct person using two identifiers.  At orientation to the IOP program, Case Manager discussed the limitations of evaluation and management by telemedicine and the availability of in person appointments. The patient expressed understanding and agreed to proceed with virtual visits throughout the duration of the program.   Location:  Patient: Patient Home Provider: Home Office   History of Present Illness: MDD and PTSD  Observations/Objective: Check In: Case Manager checked in with all participants to review discharge dates, insurance authorizations, work-related documents and needs of the treatment team, including medication review and assessment. Case Manager introduced a client to the group, with group members welcoming and starting the joining process.   Initial Therapeutic Activity: Counselor facilitated therapeutic processing with group members to assess mood and current functioning, prompting group members to share about application of skills, progress and challenges in treatment/personal lives. Client reports that she participated in a support group over the weekend and found it beneficial. Client did in vivo exposure activity to overcome trauma triggers. Client noted that she was very anxious about returning to school and needed some ideas and support around coping in the moment when she has flashbacks and re-experiencing episodes. Group members all identified a grounding, relaxation or mindfulness practice for her. She expressed appreciation for the ideas. Client presents with severe depression and severe anxiety. Client denied any current SI/HI/psychosis.                                                                                               Second Therapeutic Activity: Counselor prompted group to compare and  contrast their Depression and Anxiety. Counselor gave examples of triggers, body sensations, frequency, duration, thoughts, environments, etc to consider. Group members shared aloud their reflections and connections after creating their lists. Client noted that with depression she shuts down. shields self, overeats, and has fears. Client reports being irritable and angry. All group members validated others experiences as well. Counselor introduced a video that will be shared in our next session, prompting group members to list the things they are avoiding, as we will discuss how avoidance is related to depression and anxiety.   Check Out: Counselor prompted each group member to share self-care practice or productivity activity they can engage in today. Client plans to schedule a massage for a friend so she can get more practice. Client indicated that they are not currently a risk to self of others before signing off.  Assessment and Plan: Clinician recommends that Client remain in IOP treatment to better manage mental health symptoms, stabilization and to address treatment plan goals. Clinician recommends adherence to crisis/safety plan, taking medications as prescribed, and following up with medical professionals if any issues arise.   Follow Up Instructions: Clinician will send Webex link for next session. The Client was advised to call back or seek an in-person evaluation if the symptoms worsen or if the condition fails to improve as anticipated.  I provided 180 minutes of non-face-to-face time during this encounter.     Lise Auer, LCSW

## 2020-06-06 NOTE — Telephone Encounter (Signed)
D:  Case manager and Hillery Jacks, NP placed a call to pt to discuss her wanting to end MH-IOP.  A:  Provided pt with support.  Informed pt that the group is always changing with new patients coming in.  Also mentioned that there are some upcoming discharges this week.  Encouraged pt to give the MH-IOP a chance and recommended her to participate.  Pt states she will continue.  R:  Pt receptive.

## 2020-06-07 ENCOUNTER — Other Ambulatory Visit: Payer: Self-pay

## 2020-06-07 ENCOUNTER — Encounter (HOSPITAL_COMMUNITY): Payer: Self-pay

## 2020-06-07 ENCOUNTER — Ambulatory Visit: Payer: BC Managed Care – PPO | Admitting: Professional

## 2020-06-07 ENCOUNTER — Other Ambulatory Visit (HOSPITAL_COMMUNITY): Payer: BC Managed Care – PPO | Admitting: Psychiatry

## 2020-06-07 DIAGNOSIS — F419 Anxiety disorder, unspecified: Secondary | ICD-10-CM | POA: Diagnosis not present

## 2020-06-07 DIAGNOSIS — Z91410 Personal history of adult physical and sexual abuse: Secondary | ICD-10-CM | POA: Diagnosis not present

## 2020-06-07 DIAGNOSIS — Z79899 Other long term (current) drug therapy: Secondary | ICD-10-CM | POA: Diagnosis not present

## 2020-06-07 DIAGNOSIS — R454 Irritability and anger: Secondary | ICD-10-CM | POA: Diagnosis not present

## 2020-06-07 DIAGNOSIS — R45851 Suicidal ideations: Secondary | ICD-10-CM | POA: Diagnosis not present

## 2020-06-07 DIAGNOSIS — F4310 Post-traumatic stress disorder, unspecified: Secondary | ICD-10-CM | POA: Diagnosis not present

## 2020-06-07 DIAGNOSIS — F1010 Alcohol abuse, uncomplicated: Secondary | ICD-10-CM | POA: Diagnosis not present

## 2020-06-07 DIAGNOSIS — Z9151 Personal history of suicidal behavior: Secondary | ICD-10-CM | POA: Diagnosis not present

## 2020-06-07 DIAGNOSIS — F331 Major depressive disorder, recurrent, moderate: Secondary | ICD-10-CM

## 2020-06-07 DIAGNOSIS — F332 Major depressive disorder, recurrent severe without psychotic features: Secondary | ICD-10-CM | POA: Diagnosis not present

## 2020-06-07 DIAGNOSIS — Z87891 Personal history of nicotine dependence: Secondary | ICD-10-CM | POA: Diagnosis not present

## 2020-06-07 DIAGNOSIS — F16183 Hallucinogen abuse with hallucinogen persisting perception disorder (flashbacks): Secondary | ICD-10-CM | POA: Diagnosis not present
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
## 2020-06-08 ENCOUNTER — Other Ambulatory Visit: Payer: Self-pay

## 2020-06-08 ENCOUNTER — Encounter (HOSPITAL_COMMUNITY): Payer: Self-pay

## 2020-06-08 ENCOUNTER — Other Ambulatory Visit (HOSPITAL_COMMUNITY): Payer: BC Managed Care – PPO | Admitting: Psychiatry

## 2020-06-08 DIAGNOSIS — Z87891 Personal history of nicotine dependence: Secondary | ICD-10-CM | POA: Diagnosis not present

## 2020-06-08 DIAGNOSIS — F16183 Hallucinogen abuse with hallucinogen persisting perception disorder (flashbacks): Secondary | ICD-10-CM | POA: Diagnosis not present

## 2020-06-08 DIAGNOSIS — F4310 Post-traumatic stress disorder, unspecified: Secondary | ICD-10-CM

## 2020-06-08 DIAGNOSIS — Z79899 Other long term (current) drug therapy: Secondary | ICD-10-CM | POA: Diagnosis not present

## 2020-06-08 DIAGNOSIS — R45851 Suicidal ideations: Secondary | ICD-10-CM | POA: Diagnosis not present

## 2020-06-08 DIAGNOSIS — Z91410 Personal history of adult physical and sexual abuse: Secondary | ICD-10-CM | POA: Diagnosis not present

## 2020-06-08 DIAGNOSIS — F1010 Alcohol abuse, uncomplicated: Secondary | ICD-10-CM | POA: Diagnosis not present

## 2020-06-08 DIAGNOSIS — F332 Major depressive disorder, recurrent severe without psychotic features: Secondary | ICD-10-CM | POA: Diagnosis not present

## 2020-06-08 DIAGNOSIS — F419 Anxiety disorder, unspecified: Secondary | ICD-10-CM | POA: Diagnosis not present

## 2020-06-08 DIAGNOSIS — F331 Major depressive disorder, recurrent, moderate: Secondary | ICD-10-CM

## 2020-06-08 DIAGNOSIS — R454 Irritability and anger: Secondary | ICD-10-CM | POA: Diagnosis not present

## 2020-06-08 DIAGNOSIS — Z9151 Personal history of suicidal behavior: Secondary | ICD-10-CM | POA: Diagnosis not present

## 2020-06-08 NOTE — Progress Notes (Signed)
Virtual Visit via Video Note  I connected with Renee Norris on 06/08/20 at  9:00 AM EDT by a video enabled telemedicine application and verified that I am speaking with the correct person using two identifiers.  At orientation to the IOP program, Case Manager discussed the limitations of evaluation and management by telemedicine and the availability of in person appointments. The patient expressed understanding and agreed to proceed with virtual visits throughout the duration of the program.   Location:  Patient: Patient Home Provider: Home Office   History of Present Illness: MDD and PTSD  Observations/Objective: Check In: Case Manager checked in with all participants to review discharge dates, insurance authorizations, work-related documents and needs of the treatment team, including medication review and assessment. Case Manager introduced a client to the group, with group members welcoming and starting the joining process. Counselor facilitated therapeutic processing with group members to assess mood and current functioning, prompting group members to share about application of skills, progress and challenges in treatment/personal lives. Client reports that she applied self-care by getting tea with a friend. Client able to connect and open up better with her than she has in the past, due to mental health symptoms decreasing. Client presents with moderate depression and moderate anxiety. Client denied any current SI/HI/psychosis.   Initial Therapeutic Activity: Counselor introduced our guest speaker, Peggye Fothergill, [facility] Pharmacist, who shared about psychiatric medications, side effects, treatment considerations and how to communicate with medical professionals. Group Members asked questions and shared medication concerns. Counselor prompted group members to reference a worksheet called, "Body Scan" to jot down questions and concerns about their physical health in preparation for their upcoming  appointments with medical professionals. Client states that she has issues related to digestive system, low sex drive and lack of interest in things she once enjoyed. Counselor encouraged routine medical check-ups, preparing for appointments, following up with recommendations and seeking specialist if needed.     Second Therapeutic Activity: Counselor introduced Chief Operating Officer, Clyda Greener, Director of Wellness to present information on [employer] and Benefits. Clients engaged in activities and discussion, personalizing the information to their individual circumstances. Client indicated motivation to make small changes in wellness practices to improve overall mental health.  Check Out: Counselor closed program by allowing time to celebrate a graduating group member. Counselor shared reflections on progress and allow space for group members to share well wishes and appreciates to the graduating client. Counselor prompted graduating client to share takeaways, reflect on progress and final thoughts for the group. Client indicated that they are not currently a risk to self of others before signing off.  Assessment and Plan: Clinician recommends that Client remain in IOP treatment to better manage mental health symptoms, stabilization and to address treatment plan goals. Clinician recommends adherence to crisis/safety plan, taking medications as prescribed, and following up with medical professionals if any issues arise.   Follow Up Instructions: Clinician will send Webex link for next session. The Client was advised to call back or seek an in-person evaluation if the symptoms worsen or if the condition fails to improve as anticipated.     I provided 180 minutes of non-face-to-face time during this encounter.     Hilbert Odor, LCSW

## 2020-06-09 ENCOUNTER — Other Ambulatory Visit: Payer: Self-pay

## 2020-06-09 ENCOUNTER — Other Ambulatory Visit (HOSPITAL_COMMUNITY): Payer: BC Managed Care – PPO | Admitting: Psychiatry

## 2020-06-09 ENCOUNTER — Encounter (HOSPITAL_COMMUNITY): Payer: Self-pay

## 2020-06-09 DIAGNOSIS — Z91410 Personal history of adult physical and sexual abuse: Secondary | ICD-10-CM | POA: Diagnosis not present

## 2020-06-09 DIAGNOSIS — R45851 Suicidal ideations: Secondary | ICD-10-CM | POA: Diagnosis not present

## 2020-06-09 DIAGNOSIS — Z9151 Personal history of suicidal behavior: Secondary | ICD-10-CM | POA: Diagnosis not present

## 2020-06-09 DIAGNOSIS — F4310 Post-traumatic stress disorder, unspecified: Secondary | ICD-10-CM

## 2020-06-09 DIAGNOSIS — F1010 Alcohol abuse, uncomplicated: Secondary | ICD-10-CM | POA: Diagnosis not present

## 2020-06-09 DIAGNOSIS — F331 Major depressive disorder, recurrent, moderate: Secondary | ICD-10-CM

## 2020-06-09 DIAGNOSIS — Z87891 Personal history of nicotine dependence: Secondary | ICD-10-CM | POA: Diagnosis not present

## 2020-06-09 DIAGNOSIS — F419 Anxiety disorder, unspecified: Secondary | ICD-10-CM | POA: Diagnosis not present

## 2020-06-09 DIAGNOSIS — Z79899 Other long term (current) drug therapy: Secondary | ICD-10-CM | POA: Diagnosis not present

## 2020-06-09 DIAGNOSIS — F16183 Hallucinogen abuse with hallucinogen persisting perception disorder (flashbacks): Secondary | ICD-10-CM | POA: Diagnosis not present

## 2020-06-09 DIAGNOSIS — F332 Major depressive disorder, recurrent severe without psychotic features: Secondary | ICD-10-CM | POA: Diagnosis not present

## 2020-06-09 DIAGNOSIS — R454 Irritability and anger: Secondary | ICD-10-CM | POA: Diagnosis not present

## 2020-06-09 NOTE — Progress Notes (Signed)
Virtual Visit via Video Note  I connected with [name] on 06/09/20 at  9:00 AM EDT by a video enabled telemedicine application and verified that I am speaking with the correct person using two identifiers.  At orientation to the IOP program, Case Manager discussed the limitations of evaluation and management by telemedicine and the availability of in person appointments. The patient expressed understanding and agreed to proceed with virtual visits throughout the duration of the program.   Location:  Patient: Patient Home Provider: Home Office   History of Present Illness: MDD  Observations/Objective: Check In: Case Manager checked in with all participants to review discharge dates, insurance authorizations, work-related documents and needs of the treatment team, including medication review and assessment. Case Manager introduced a client to the group, with group members welcoming and starting the joining process. Counselor facilitated therapeutic processing with group members to assess mood and current functioning, prompting group members to share about application of skills, progress and challenges in treatment/personal lives. Client reports that she had the best she has had in a long while yesterday. Client went for a walk and took pets with her to [facility] for a treat. Client more hopeful and more engaged in the program. Client presents with moderate depression and moderate anxiety. Client denied any current SI/HI/psychosis.   Initial Therapeutic Activity: Counselor introduced [employer], [place] Chaplain to present information and discussion on Grief and Loss. Group members engaged in discussion, sharing how grief impacts them, what comforts them, what emotions are felt, labeling losses, etc. After guest speaker logged off, Counselor prompted group to spend 10-15 minutes journaling to process personal grief and loss situations. Counselor processed entries with group and client's  identified areas for additional processing in individual therapy. Client noted that she continues to grieve the loss of her parents and her beliefs about how to grief appropriately. Client would like to stop punishing herself for things in the past.   Second Therapeutic Activity: Counselor presented psychoeducation on the Power of Secretary/administrator. Counselor reviewed an article which included tips and strategies on implementing gratitude into everyday practices. Counselor shared a video giving examples of gratitude statements. Group Members shared which statements resonated most with them. Client connected with focusing on lessons learned from difficult situations in stead of dwelling on pain.  Check Out: Counselor closed program by allowing time to celebrate a graduating group member. Counselor shared reflections on progress and allow space for group members to share well wishes and appreciates to the graduating client. Counselor prompted graduating client to share takeaways, reflect on progress and final thoughts for the group. Client indicated that they are not currently a risk to self of others before signing off.  Assessment and Plan: Clinician recommends that Client remain in IOP treatment to better manage mental health symptoms, stabilization and to address treatment plan goals. Clinician recommends adherence to crisis/safety plan, taking medications as prescribed, and following up with medical professionals if any issues arise.   Follow Up Instructions: Clinician will send Webex link for next session. The Client was advised to call back or seek an in-person evaluation if the symptoms worsen or if the condition fails to improve as anticipated.     I provided 180 minutes of non-face-to-face time during this encounter.     Hilbert Odor, LCSW

## 2020-06-10 ENCOUNTER — Other Ambulatory Visit (HOSPITAL_COMMUNITY): Payer: BC Managed Care – PPO | Admitting: Psychiatry

## 2020-06-10 ENCOUNTER — Other Ambulatory Visit: Payer: Self-pay

## 2020-06-10 ENCOUNTER — Telehealth (HOSPITAL_COMMUNITY): Payer: Self-pay | Admitting: Psychiatry

## 2020-06-13 ENCOUNTER — Other Ambulatory Visit: Payer: Self-pay

## 2020-06-13 ENCOUNTER — Ambulatory Visit (HOSPITAL_COMMUNITY): Payer: BC Managed Care – PPO

## 2020-06-13 ENCOUNTER — Encounter (HOSPITAL_COMMUNITY): Payer: Self-pay

## 2020-06-13 ENCOUNTER — Other Ambulatory Visit (HOSPITAL_COMMUNITY): Payer: BC Managed Care – PPO | Admitting: Psychiatry

## 2020-06-13 DIAGNOSIS — F1010 Alcohol abuse, uncomplicated: Secondary | ICD-10-CM | POA: Diagnosis not present

## 2020-06-13 DIAGNOSIS — Z79899 Other long term (current) drug therapy: Secondary | ICD-10-CM | POA: Diagnosis not present

## 2020-06-13 DIAGNOSIS — F419 Anxiety disorder, unspecified: Secondary | ICD-10-CM | POA: Diagnosis not present

## 2020-06-13 DIAGNOSIS — Z9151 Personal history of suicidal behavior: Secondary | ICD-10-CM | POA: Diagnosis not present

## 2020-06-13 DIAGNOSIS — R454 Irritability and anger: Secondary | ICD-10-CM | POA: Diagnosis not present

## 2020-06-13 DIAGNOSIS — F16183 Hallucinogen abuse with hallucinogen persisting perception disorder (flashbacks): Secondary | ICD-10-CM | POA: Diagnosis not present

## 2020-06-13 DIAGNOSIS — Z87891 Personal history of nicotine dependence: Secondary | ICD-10-CM | POA: Diagnosis not present

## 2020-06-13 DIAGNOSIS — Z91410 Personal history of adult physical and sexual abuse: Secondary | ICD-10-CM | POA: Diagnosis not present

## 2020-06-13 DIAGNOSIS — F332 Major depressive disorder, recurrent severe without psychotic features: Secondary | ICD-10-CM | POA: Diagnosis not present

## 2020-06-13 DIAGNOSIS — F4310 Post-traumatic stress disorder, unspecified: Secondary | ICD-10-CM

## 2020-06-13 DIAGNOSIS — F331 Major depressive disorder, recurrent, moderate: Secondary | ICD-10-CM

## 2020-06-13 DIAGNOSIS — R45851 Suicidal ideations: Secondary | ICD-10-CM | POA: Diagnosis not present

## 2020-06-13 NOTE — Progress Notes (Signed)
Virtual Visit via Video Note  I connected with [name] on 06/13/20 at  9:00 AM EDT by a video enabled telemedicine application and verified that I am speaking with the correct person using two identifiers.  At orientation to the IOP program, Case Manager discussed the limitations of evaluation and management by telemedicine and the availability of in person appointments. The patient expressed understanding and agreed to proceed with virtual visits throughout the duration of the program.   Location:  Patient: Patient Home Provider: Home Office   History of Present Illness: MDD and PTSD  Observations/Objective: Check In: Case Manager checked in with all participants to review discharge dates, insurance authorizations, work-related documents and needs for the treatment team. Counselor facilitated a check-in with group members to assess mood and current functioning. 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 Client expressed loss of interest, happiness, worthlessness and sadness felt about situation. Client named motivators to proceed with court case and in completing course work  Client presents with severe depression and severe anxiety. Client denied any current SI/HI/psychosis.    Initial Therapeutic Activity: Counselor provided group with psychoeducation on being Introverted vs Extroverted, and how mental health is influenced. Counselor provided a link for group members to assess where they are on the spectrum. Client shared that their results indicate that they are an introvert in an extroverted world. Group processed their experiences, as Counselor provided history of Myers-Briggs and additional information on qualities of each category.   Second Therapeutic Activity: Counselor shifted discussion to childhood traumas that have impacted the ways in which they  view themselves, others, and the world. Counselor provided psychoeducation on adverse childhood events and the impact of childhood trauma. Client shared about childhood traumas inflicted by parents and siblings and unhealthy coping mechanizes which worsened MH.   Check Out: Counselor prompted group members to share one thing they could do for self-care or for fun. Client stated she plans to nap. Counselor closed program by allowing time to celebrate a graduating group member. Counselor shared reflections on progress and allow space for group members to share well wishes and appreciates to the graduating client. Counselor prompted graduating client to share takeaways, reflect on progress and final thoughts for the group. Client indicated that they are not currently a risk to self of others before signing off.  Assessment and Plan: Clinician recommends that Client remain in IOP treatment to better manage mental health symptoms, stabilization and to address treatment plan goals. Clinician recommends adherence to crisis/safety plan, taking medications as prescribed, and following up with medical professionals if any issues arise.   Follow Up Instructions: Clinician will send Webex link for next session. The Client was advised to call back or seek an in-person evaluation if the symptoms worsen or if the condition fails to improve as anticipated.     I provided 180 minutes of non-face-to-face time during this encounter.     Hilbert Odor, LCSW

## 2020-06-14 ENCOUNTER — Ambulatory Visit: Payer: BC Managed Care – PPO | Admitting: Professional

## 2020-06-15 ENCOUNTER — Other Ambulatory Visit (HOSPITAL_COMMUNITY): Payer: BC Managed Care – PPO | Admitting: Psychiatry

## 2020-06-15 ENCOUNTER — Ambulatory Visit (HOSPITAL_COMMUNITY): Payer: BC Managed Care – PPO

## 2020-06-15 ENCOUNTER — Encounter (HOSPITAL_COMMUNITY): Payer: Self-pay

## 2020-06-15 ENCOUNTER — Other Ambulatory Visit: Payer: Self-pay

## 2020-06-15 DIAGNOSIS — R454 Irritability and anger: Secondary | ICD-10-CM | POA: Diagnosis not present

## 2020-06-15 DIAGNOSIS — F332 Major depressive disorder, recurrent severe without psychotic features: Secondary | ICD-10-CM | POA: Diagnosis not present

## 2020-06-15 DIAGNOSIS — Z79899 Other long term (current) drug therapy: Secondary | ICD-10-CM | POA: Diagnosis not present

## 2020-06-15 DIAGNOSIS — F4310 Post-traumatic stress disorder, unspecified: Secondary | ICD-10-CM | POA: Diagnosis not present

## 2020-06-15 DIAGNOSIS — F419 Anxiety disorder, unspecified: Secondary | ICD-10-CM | POA: Diagnosis not present

## 2020-06-15 DIAGNOSIS — F16183 Hallucinogen abuse with hallucinogen persisting perception disorder (flashbacks): Secondary | ICD-10-CM | POA: Diagnosis not present

## 2020-06-15 DIAGNOSIS — Z91410 Personal history of adult physical and sexual abuse: Secondary | ICD-10-CM | POA: Diagnosis not present

## 2020-06-15 DIAGNOSIS — Z9151 Personal history of suicidal behavior: Secondary | ICD-10-CM | POA: Diagnosis not present

## 2020-06-15 DIAGNOSIS — Z87891 Personal history of nicotine dependence: Secondary | ICD-10-CM | POA: Diagnosis not present

## 2020-06-15 DIAGNOSIS — F331 Major depressive disorder, recurrent, moderate: Secondary | ICD-10-CM

## 2020-06-15 DIAGNOSIS — R45851 Suicidal ideations: Secondary | ICD-10-CM | POA: Diagnosis not present

## 2020-06-15 DIAGNOSIS — F1010 Alcohol abuse, uncomplicated: Secondary | ICD-10-CM | POA: Diagnosis not present

## 2020-06-15 NOTE — Progress Notes (Signed)
Virtual Visit via Video Note  I connected with Renee Norris on 06/15/20 at  9:00 AM EDT by a video enabled telemedicine application and verified that I am speaking with the correct person using two identifiers.  At orientation to the IOP program, Case Manager discussed the limitations of evaluation and management by telemedicine and the availability of in person appointments. The patient expressed understanding and agreed to proceed with virtual visits throughout the duration of the program.   Location:  Patient: Patient Home Provider: Home Office   History of Present Illness: MDD and PTSD  Observations/Objective: Check In: Case Manager checked in with all participants to review discharge dates, insurance authorizations, work-related documents and needs for the treatment team.    Initial Therapeutic Activity: Counselor facilitated a check-in with group members to assess mood and current functioning. Client reports that she is currently under extreme stress due to husband experiencing and being treated for emergent medical issues. Client noted that she needed to be in a space that was supportive and safe as she awaits results. Group offered supportive listening and encouragement to reduce Client anxiety. Client presents with moderate depression and severe anxiety. Client denied any current SI/HI/psychosis.   Second Therapeutic Activity: Counselor prompted group members to reflect on their ideas related to Boundaries. Counselor asked, how do they know boundaries are healthy, what makes them feel safe and how can they improve their boundaries? Client stated that she needs to feel safe, not pressured, hurt or mistreated and that she has trouble saying no. Counselor presented psychoeducation on the topic of boundaries, defining types, social and cultural impacts and then we labeled what type of boundaries they have in the various areas of their lives. Client notes she has rigid boundaries. Counselor  provided practical tips and strategies for setting healthier boundaries in life and relationships. Client committed in implementing strategies to improve boundary setting.   Check Out: Counselor closed group by prompting group members to share their plans for self-care today. Client states that she plans to nap due to lack of sleep with caring for her husband. Client indicated that they are not currently a risk to self of others before signing off.  Assessment and Plan: Clinician recommends that Client remain in IOP treatment to better manage mental health symptoms, stabilization and to address treatment plan goals. Clinician recommends adherence to crisis/safety plan, taking medications as prescribed, and following up with medical professionals if any issues arise.   Follow Up Instructions: Clinician will send Webex link for next session. The Client was advised to call back or seek an in-person evaluation if the symptoms worsen or if the condition fails to improve as anticipated.     I provided 180 minutes of non-face-to-face time during this encounter.     Hilbert Odor, LCSW

## 2020-06-16 ENCOUNTER — Other Ambulatory Visit (HOSPITAL_COMMUNITY): Payer: BC Managed Care – PPO | Admitting: Licensed Clinical Social Worker

## 2020-06-16 ENCOUNTER — Other Ambulatory Visit: Payer: Self-pay

## 2020-06-16 DIAGNOSIS — R45851 Suicidal ideations: Secondary | ICD-10-CM | POA: Diagnosis not present

## 2020-06-16 DIAGNOSIS — F419 Anxiety disorder, unspecified: Secondary | ICD-10-CM | POA: Diagnosis not present

## 2020-06-16 DIAGNOSIS — F332 Major depressive disorder, recurrent severe without psychotic features: Secondary | ICD-10-CM | POA: Diagnosis not present

## 2020-06-16 DIAGNOSIS — Z79899 Other long term (current) drug therapy: Secondary | ICD-10-CM | POA: Diagnosis not present

## 2020-06-16 DIAGNOSIS — Z91410 Personal history of adult physical and sexual abuse: Secondary | ICD-10-CM | POA: Diagnosis not present

## 2020-06-16 DIAGNOSIS — Z87891 Personal history of nicotine dependence: Secondary | ICD-10-CM | POA: Diagnosis not present

## 2020-06-16 DIAGNOSIS — F331 Major depressive disorder, recurrent, moderate: Secondary | ICD-10-CM

## 2020-06-16 DIAGNOSIS — Z9151 Personal history of suicidal behavior: Secondary | ICD-10-CM | POA: Diagnosis not present

## 2020-06-16 DIAGNOSIS — F4310 Post-traumatic stress disorder, unspecified: Secondary | ICD-10-CM | POA: Diagnosis not present

## 2020-06-16 DIAGNOSIS — F1010 Alcohol abuse, uncomplicated: Secondary | ICD-10-CM | POA: Diagnosis not present

## 2020-06-16 DIAGNOSIS — F16183 Hallucinogen abuse with hallucinogen persisting perception disorder (flashbacks): Secondary | ICD-10-CM | POA: Diagnosis not present

## 2020-06-16 DIAGNOSIS — R454 Irritability and anger: Secondary | ICD-10-CM | POA: Diagnosis not present

## 2020-06-17 ENCOUNTER — Other Ambulatory Visit (HOSPITAL_COMMUNITY): Payer: BC Managed Care – PPO

## 2020-06-17 ENCOUNTER — Other Ambulatory Visit: Payer: Self-pay

## 2020-06-20 ENCOUNTER — Other Ambulatory Visit (HOSPITAL_COMMUNITY): Payer: BC Managed Care – PPO | Admitting: Psychiatry

## 2020-06-20 ENCOUNTER — Other Ambulatory Visit: Payer: Self-pay

## 2020-06-21 ENCOUNTER — Encounter (HOSPITAL_COMMUNITY): Payer: Self-pay

## 2020-06-21 ENCOUNTER — Other Ambulatory Visit (HOSPITAL_COMMUNITY): Payer: BC Managed Care – PPO | Attending: Psychiatry | Admitting: Psychiatry

## 2020-06-21 ENCOUNTER — Other Ambulatory Visit: Payer: Self-pay

## 2020-06-21 DIAGNOSIS — F332 Major depressive disorder, recurrent severe without psychotic features: Secondary | ICD-10-CM | POA: Insufficient documentation

## 2020-06-21 DIAGNOSIS — R45851 Suicidal ideations: Secondary | ICD-10-CM | POA: Insufficient documentation

## 2020-06-21 DIAGNOSIS — F419 Anxiety disorder, unspecified: Secondary | ICD-10-CM | POA: Insufficient documentation

## 2020-06-21 DIAGNOSIS — F1010 Alcohol abuse, uncomplicated: Secondary | ICD-10-CM | POA: Insufficient documentation

## 2020-06-21 DIAGNOSIS — R454 Irritability and anger: Secondary | ICD-10-CM | POA: Diagnosis not present

## 2020-06-21 DIAGNOSIS — F331 Major depressive disorder, recurrent, moderate: Secondary | ICD-10-CM

## 2020-06-21 DIAGNOSIS — F4310 Post-traumatic stress disorder, unspecified: Secondary | ICD-10-CM

## 2020-06-21 NOTE — Progress Notes (Signed)
Virtual Visit via Video Note  I connected with Renee Norris on 06/21/20 at  9:00 AM EDT by a video enabled telemedicine application and verified that I am speaking with the correct person using two identifiers.  At orientation to the IOP program, Case Manager discussed the limitations of evaluation and management by telemedicine and the availability of in person appointments. The patient expressed understanding and agreed to proceed with virtual visits throughout the duration of the program.   Location:  Patient: Patient Home Provider: Home Office   History of Present Illness: MDD and PTSD  Observations/Objective: Check In: Case Manager checked in with all participants to review discharge dates, insurance authorizations, work-related documents and needs for the treatment team. Counselor facilitated a check-in with group members to assess mood and current functioning. Client reports that she was triggered at school with reminders of her mother passing. Client stated that she applied coping skills and experienced some emotional healing in the process. Additionally, Client stated that she is speaking more in a support group she attends and that her husband has been attending as a support. Client presents with moderate depression and moderate anxiety. Client denied any current SI/HI/psychosis.   Initial Therapeutic Activity: Counselor provided psychoeducation on comorbidity of Mental Health and Substance Abuse conditions. Counselor opened discussion for group members to share about family history and personal history of addiction or problem areas of addiction. Counselor provided group members with strategies in combatting addiction and resources for professional help/support groups. Client stated that she currently attends a support group and is attempting to cut back on wine consumption. Client connects drinking with self-punishment for past life experiences.  Second Therapeutic Activity: Counselor  prompted group members to self-assess the following areas in relation to self-care practices: Personal, Professional, Psychological, Emotional, Spiritual, and Physical. Client shared that her scores were in the midrange with focus on personal growth and emotional growth. Counselor provided group members with the [employer], showing creative and practical ways to add more self-care in these life domains. Client stated that they are willing to try a vision board, writing poems and time with friends/making date nights to improve self-care. Counselor additionally provided group members with 2 exhaustive lists of coping skills and strategies for dealing with stress, anxiety, depression and other mental health/life challenges.  Check Out: Counselor closed program by prompting group members to share one self-care practice or productivity activity they can complete today to reduce anxiety. Client stated that they plan to take a walk outside. Client indicated that they are not currently a risk to self of others before signing off.  Assessment and Plan: Clinician recommends that Client remain in IOP treatment to better manage mental health symptoms, stabilization and to address treatment plan goals. Clinician recommends adherence to crisis/safety plan, taking medications as prescribed, and following up with medical professionals if any issues arise.   Follow Up Instructions: Clinician will send Webex link for next session. The Client was advised to call back or seek an in-person evaluation if the symptoms worsen or if the condition fails to improve as anticipated.     I provided 180 minutes of non-face-to-face time during this encounter.     Hilbert Odor, LCSW

## 2020-06-22 ENCOUNTER — Encounter (HOSPITAL_COMMUNITY): Payer: Self-pay

## 2020-06-22 ENCOUNTER — Other Ambulatory Visit (HOSPITAL_COMMUNITY): Payer: BC Managed Care – PPO | Admitting: Psychiatry

## 2020-06-22 ENCOUNTER — Other Ambulatory Visit: Payer: Self-pay

## 2020-06-22 DIAGNOSIS — F332 Major depressive disorder, recurrent severe without psychotic features: Secondary | ICD-10-CM | POA: Diagnosis not present

## 2020-06-22 DIAGNOSIS — F1010 Alcohol abuse, uncomplicated: Secondary | ICD-10-CM | POA: Diagnosis not present

## 2020-06-22 DIAGNOSIS — R45851 Suicidal ideations: Secondary | ICD-10-CM | POA: Diagnosis not present

## 2020-06-22 DIAGNOSIS — F419 Anxiety disorder, unspecified: Secondary | ICD-10-CM | POA: Diagnosis not present

## 2020-06-22 DIAGNOSIS — R454 Irritability and anger: Secondary | ICD-10-CM | POA: Diagnosis not present

## 2020-06-22 DIAGNOSIS — F4310 Post-traumatic stress disorder, unspecified: Secondary | ICD-10-CM

## 2020-06-22 DIAGNOSIS — F331 Major depressive disorder, recurrent, moderate: Secondary | ICD-10-CM

## 2020-06-22 NOTE — Progress Notes (Signed)
Virtual Visit via Video Note  I connected with [name] on 06/22/20 at  9:00 AM EDT by a video enabled telemedicine application and verified that I am speaking with the correct person using two identifiers.  At orientation to the IOP program, Case Manager discussed the limitations of evaluation and management by telemedicine and the availability of in person appointments. The patient expressed understanding and agreed to proceed with virtual visits throughout the duration of the program.   Location:  Patient: Patient Home Provider: Home Office   History of Present Illness: PTSD and MDD  Observations/Objective: Check In: Case Manager checked in with all participants to review discharge dates, insurance authorizations, work-related documents and needs for the treatment team.    Initial Therapeutic Activity: Counselor facilitated a check-in with group members to assess mood and current functioning. Client reports that she does not feel well emotionally today and wasn't in the mood to talk at the onset of the session. Counselor communicated with the Client over chat and email to engage her in the group. Client eventually opened up to shared about her thoughts and feelings. Client was triggered from a conversation had with her husband yesterday, causing her to skip her nigh classes. Client able to accept support and guidance from group members. Client open to strategies suggested. Client presents with severe depression and severe anxiety. Client denied any current SI/HI/psychosis.  Second Therapeutic Activity: Counselor introduced our guest speaker, Peggye Fothergill, [employer] Pharmacist, who shared about psychiatric medications, side effects, treatment considerations and how to communicate with medical professionals. Group Members asked questions and shared medication concerns. Counselor prompted group members to reference a worksheet called, "Body Scan" to jot down questions and concerns about their  physical health in preparation for their upcoming appointments with medical professionals. Client in need of OBGYN appointment, dentist and orthopedic doctor. Counselor encouraged routine medical check-ups, preparing for appointments, following up with recommendations and seeking specialist if needed.  Check Out: Counselor closed program by prompting group members to share one self-care practice or productivity activity they can complete today to reduce anxiety. Client stated that they plan to take a nap and eat a light lunch. Client also participating in a job interview. Client indicated that they are not currently a risk to self of others before signing off.  Assessment and Plan: Clinician recommends that Client remain in IOP treatment to better manage mental health symptoms, stabilization and to address treatment plan goals. Clinician recommends adherence to crisis/safety plan, taking medications as prescribed, and following up with medical professionals if any issues arise.   Follow Up Instructions: Clinician will send Webex link for next session. The Client was advised to call back or seek an in-person evaluation if the symptoms worsen or if the condition fails to improve as anticipated.     I provided 180 minutes of non-face-to-face time during this encounter.     Hilbert Odor, LCSW

## 2020-06-23 ENCOUNTER — Other Ambulatory Visit: Payer: Self-pay

## 2020-06-23 ENCOUNTER — Other Ambulatory Visit (HOSPITAL_COMMUNITY): Payer: BC Managed Care – PPO | Admitting: Psychiatry

## 2020-06-23 ENCOUNTER — Encounter (HOSPITAL_COMMUNITY): Payer: Self-pay

## 2020-06-23 DIAGNOSIS — F332 Major depressive disorder, recurrent severe without psychotic features: Secondary | ICD-10-CM | POA: Diagnosis not present

## 2020-06-23 DIAGNOSIS — F4310 Post-traumatic stress disorder, unspecified: Secondary | ICD-10-CM

## 2020-06-23 DIAGNOSIS — F1010 Alcohol abuse, uncomplicated: Secondary | ICD-10-CM | POA: Diagnosis not present

## 2020-06-23 DIAGNOSIS — R454 Irritability and anger: Secondary | ICD-10-CM | POA: Diagnosis not present

## 2020-06-23 DIAGNOSIS — F419 Anxiety disorder, unspecified: Secondary | ICD-10-CM | POA: Diagnosis not present

## 2020-06-23 DIAGNOSIS — F331 Major depressive disorder, recurrent, moderate: Secondary | ICD-10-CM

## 2020-06-23 DIAGNOSIS — R45851 Suicidal ideations: Secondary | ICD-10-CM | POA: Diagnosis not present
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
## 2020-06-24 ENCOUNTER — Other Ambulatory Visit: Payer: Self-pay

## 2020-06-24 ENCOUNTER — Other Ambulatory Visit (HOSPITAL_COMMUNITY): Payer: BC Managed Care – PPO

## 2020-06-27 ENCOUNTER — Encounter: Payer: Self-pay | Admitting: Osteopathic Medicine

## 2020-06-27 ENCOUNTER — Other Ambulatory Visit: Payer: Self-pay

## 2020-06-27 ENCOUNTER — Other Ambulatory Visit (HOSPITAL_COMMUNITY): Payer: BC Managed Care – PPO

## 2020-06-28 ENCOUNTER — Ambulatory Visit: Payer: BC Managed Care – PPO | Admitting: Professional

## 2020-06-28 ENCOUNTER — Other Ambulatory Visit: Payer: Self-pay

## 2020-06-28 ENCOUNTER — Other Ambulatory Visit (HOSPITAL_COMMUNITY): Payer: BC Managed Care – PPO | Admitting: Psychiatry

## 2020-06-28 DIAGNOSIS — F1010 Alcohol abuse, uncomplicated: Secondary | ICD-10-CM | POA: Diagnosis not present

## 2020-06-28 DIAGNOSIS — F419 Anxiety disorder, unspecified: Secondary | ICD-10-CM | POA: Diagnosis not present

## 2020-06-28 DIAGNOSIS — F4310 Post-traumatic stress disorder, unspecified: Secondary | ICD-10-CM

## 2020-06-28 DIAGNOSIS — F332 Major depressive disorder, recurrent severe without psychotic features: Secondary | ICD-10-CM | POA: Diagnosis not present

## 2020-06-28 DIAGNOSIS — F331 Major depressive disorder, recurrent, moderate: Secondary | ICD-10-CM

## 2020-06-28 DIAGNOSIS — R454 Irritability and anger: Secondary | ICD-10-CM | POA: Diagnosis not present

## 2020-06-28 DIAGNOSIS — R45851 Suicidal ideations: Secondary | ICD-10-CM | POA: Diagnosis not present

## 2020-06-28 MED ORDER — PANTOPRAZOLE SODIUM 20 MG PO TBEC: 20.0000 mg | DELAYED_RELEASE_TABLET | Freq: Every day | ORAL | 3 refills | Status: DC

## 2020-06-29 ENCOUNTER — Encounter (HOSPITAL_COMMUNITY): Payer: Self-pay

## 2020-06-29 ENCOUNTER — Other Ambulatory Visit (HOSPITAL_COMMUNITY): Payer: BC Managed Care – PPO | Admitting: Psychiatry

## 2020-06-29 ENCOUNTER — Other Ambulatory Visit: Payer: Self-pay

## 2020-06-29 DIAGNOSIS — R454 Irritability and anger: Secondary | ICD-10-CM | POA: Diagnosis not present

## 2020-06-29 DIAGNOSIS — R45851 Suicidal ideations: Secondary | ICD-10-CM | POA: Diagnosis not present

## 2020-06-29 DIAGNOSIS — F331 Major depressive disorder, recurrent, moderate: Secondary | ICD-10-CM

## 2020-06-29 DIAGNOSIS — F332 Major depressive disorder, recurrent severe without psychotic features: Secondary | ICD-10-CM | POA: Diagnosis not present

## 2020-06-29 DIAGNOSIS — F419 Anxiety disorder, unspecified: Secondary | ICD-10-CM | POA: Diagnosis not present

## 2020-06-29 DIAGNOSIS — F4310 Post-traumatic stress disorder, unspecified: Secondary | ICD-10-CM

## 2020-06-29 DIAGNOSIS — F1010 Alcohol abuse, uncomplicated: Secondary | ICD-10-CM | POA: Diagnosis not present

## 2020-06-29 NOTE — Progress Notes (Signed)
Virtual Visit via Video Note  I connected with [name] on 06/16/20 at  9:00 AM EDT by a video enabled telemedicine application and verified that I am speaking with the correct person using two identifiers.  Location: Patient: Patient Home Provider: Clinical Home Office   I discussed the limitations of evaluation and management by telemedicine and the availability of in person appointments. The patient expressed understanding and agreed to proceed.  I discussed the assessment and treatment plan with the patient. The patient was provided an opportunity to ask questions and all were answered. The patient agreed with the plan and demonstrated an understanding of the instructions.   The patient was advised to call back or seek an in-person evaluation if the symptoms worsen or if the condition fails to improve as anticipated.  Pt was provided 180 minutes of non-face-to-face time during this encounter.   Daily Group Progress Note  Program: IOP  Group Time: 9:00 - 10:00  Participation Level: Active  Behavioral Response: Appropriate and Sharing  Type of Therapy:  Group Therapy  Summary: Chaplain A. Davee Lomax facilitated grief and loss group.    Therapist Response: Pt engaged in group discussion.       Group Time: 10:00 - 11:00  Participation Level:  Minimal  Behavioral Response: Appropriate and Sharing  Type of Therapy: Group Therapy  Summary: Clinician led check-in regarding current stressors and situation, and review of patient completed daily inventory. Clinician utilized active listening and empathetic response and validated patient emotions. Clinician facilitated processing group on pertinent issues.   Therapist Response: Patient arrived within time allowed and reports that she is feeling "low" Patient rates hermood at a3.5on a scale of 1-10 with 10 being great. Pt reports high anxiety due to her husband experiencing medical concerns yesterday. Pt states  she did not sleep well. Pt able to process. Pt engaged in discussion.     Group Time: 11:00 - 12:00  Participation Level:  Minimal  Behavioral Response: Appropriate and Sharing  Type of Therapy: Group Therapy  Summary: Cln introduced grounding techniques as a coping strategy. Cln utilized handout "Detaching from emotional pain" from [employer]. Group reviewed grounding strategies and how they can apply them to their every day life and in which situations.  Therapist Response: Pt  is able to identify ways to utilize the techniques.    Donia Guiles, LCSW
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
## 2020-06-29 NOTE — Progress Notes (Signed)
Virtual Visit via Video Note  I connected with [name] on 06/29/20 at  9:00 AM EST by a video enabled telemedicine application and verified that I am speaking with the correct person using two identifiers.  At orientation to the IOP program, Case Manager discussed the limitations of evaluation and management by telemedicine and the availability of in person appointments. The patient expressed understanding and agreed to proceed with virtual visits throughout the duration of the program.   Location:  Patient: Patient Home Provider: Home Office   History of Present Illness: PTSD and MDD  Observations/Objective: Check In: Case Manager checked in with all participants to review discharge dates, insurance authorizations, work-related documents and needs for the treatment team. Counselor facilitated a check-in with group members to assess mood and current functioning. Client reports that she provided information on mental health and PTSD to a group of people, sharing some of the things she has learned in group thus far. 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 Client presents with mild depression and mild anxiety. Client denied any current SI/HI/psychosis.   Initial Therapeutic Activity: Counselor provided psychoeducation on Mental Health advocacy in the workplace. Counselor role played various scenarios with group to practice communicating needs, setting healthy boundaries and influencing culture around employee mental health. Counselor provided group with a video highlighting skill to process visually.  Second Therapeutic Activity: Counselor prompted group members to journal about work related stressors to identify areas where they can achieve more work life balance. Group members shared issues aloud with the group and received feedback/connected challenges. Counselor provided group with a video on 10 ways to  reduce and better manage workplace stress. Counselor processed takeways with group. Client stated that they plan to implement all the strategies learned in the video, wishing she was aware of these concepts at the beginning of her career.   Check Out: Counselor prompted group members to share their plans for implementing self-care or to engage in a productivity activity. Client plans to take a mindfulness walk in her neighborhood today to better manage mental health symptoms. Client indicated that they are not currently a risk to self of others before signing off.  Assessment and Plan: Clinician recommends that Client remain in IOP treatment to better manage mental health symptoms, stabilization and to address treatment plan goals. Clinician recommends adherence to crisis/safety plan, taking medications as prescribed, and following up with medical professionals if any issues arise.   Follow Up Instructions: Clinician will send Webex link for next session. The Client was advised to call back or seek an in-person evaluation if the symptoms worsen or if the condition fails to improve as anticipated.     I provided 180 minutes of non-face-to-face time during this encounter.     Hilbert Odor, LCSW

## 2020-06-30 ENCOUNTER — Other Ambulatory Visit: Payer: Self-pay

## 2020-06-30 ENCOUNTER — Other Ambulatory Visit (HOSPITAL_COMMUNITY): Payer: BC Managed Care – PPO | Admitting: Psychiatry

## 2020-06-30 DIAGNOSIS — R45851 Suicidal ideations: Secondary | ICD-10-CM | POA: Diagnosis not present

## 2020-06-30 DIAGNOSIS — F4310 Post-traumatic stress disorder, unspecified: Secondary | ICD-10-CM

## 2020-06-30 DIAGNOSIS — R454 Irritability and anger: Secondary | ICD-10-CM | POA: Diagnosis not present

## 2020-06-30 DIAGNOSIS — F332 Major depressive disorder, recurrent severe without psychotic features: Secondary | ICD-10-CM | POA: Diagnosis not present

## 2020-06-30 DIAGNOSIS — F331 Major depressive disorder, recurrent, moderate: Secondary | ICD-10-CM

## 2020-06-30 DIAGNOSIS — F1010 Alcohol abuse, uncomplicated: Secondary | ICD-10-CM | POA: Diagnosis not present

## 2020-06-30 DIAGNOSIS — F419 Anxiety disorder, unspecified: Secondary | ICD-10-CM | POA: Diagnosis not present
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
## 2020-06-30 NOTE — Progress Notes (Signed)
Virtual Visit via Video Note   I connected with Theotis Burrow. Muehl on 06/30/20 at 9:00 AM EST by a video enabled telemedicine application and verified that I am speaking with the correct person using two identifiers.   Location: Patient: Patient Home Provider: [facility] Office   Case Manager discussed the limitations of evaluation and management by telemedicine and the availability of in person appointments during orientation. The patient expressed understanding and agreed to proceed.   History of Present Illness: MDD and PTSD   Observations/Objective: Case Manager checked in with all participants to review discharge dates, insurance authorizations, work-related documents and needs for the treatment team. Counselor facilitated a check-in with group members to gauge mood and current functioning as well as identify recent progress towards treatment goals.  Boyd presented to group session on time and was alert, oriented x5, with no evidence or self-report of current SI/HI or A/V H.  Marley reported scores of 5/10 for depression and 2/10 for anxiety today.  Evy reported that groups have been helpful overall and she will miss having the chance to interact with peers.  She reported that she would follow up with PCP and new therapist upon discharge.  Jini was encouraged to follow up with community support groups through [facility] to continue building peer network.    Counselor introduced [place], [employer] Chaplain to present information and discussion on Grief and Loss. Group members engaged in discussion, sharing how grief impacts them, what comforts them, what emotions are felt, labeling losses, etc.  Ellyssa reported that today marks 6 years since her mother passed away, and she is still grieving this loss, but tries to lean on husband and brother for support during this anniversary, and reflect on positive memories she shared with her.  She stated "I don't feel much like talking about it  today, but its helpful to hear others share".    Counselor introduced topic of creating mental health maintenance plan today.  Counselor provided handout on subject to members, which stressed the importance of maintaining one's mental health in a similar way to using diet and exercise to ensure physical health.  Counselor walked members through process of identifying triggers which could worsen symptoms, including specific people, places, and things one needs to avoid.  Members were also tasked with identifying warning signs such as thoughts, feelings, or behaviors which could indicate mental health is at increased risk.  Counselor also facilitated conversation on self-care activities and coping strategies which members have previously utilized in the past, are currently using in daily routine, or plan to use soon to assist with managing problems or symptoms when/if they appear.  Counselor encouraged members to revisit their maintenance plan often and make changes as needed to ensure day to day stability.  Intervention effectiveness could not be measured, as Carly did not participate in this portion of group.     Assessment and Plan: Counselor recommends that patient follow up with outpatient therapy and community support groups in order to better manage mental health symptoms and continue to address personal goals. Counselor recommends adherence to crisis/safety plan, taking medications as prescribed and following up with medical professionals if any issues arise.    Follow Up Instructions: The patient was advised to call back or seek an in-person evaluation if the symptoms worsen or if the condition fails to improve as anticipated.   I provided 180 minutes of non-face-to-face time during this encounter.     Noralee Stain, LCSW, LCAS

## 2020-06-30 NOTE — Patient Instructions (Signed)
D:  Patient completed MH-IOP today.  A:  Discharge today.  Follow up with PCP (Dr. Sunnie Nielsen) and Teofilo Pod, [facility] on 07-12-20 @ 9 a.m.  Strongly encouraged support groups through [facility] [phone].  R:  Patient receptive.

## 2020-07-02 ENCOUNTER — Encounter (HOSPITAL_COMMUNITY): Payer: Self-pay | Admitting: Family
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
## 2020-07-05 ENCOUNTER — Other Ambulatory Visit: Payer: Self-pay

## 2020-07-05 ENCOUNTER — Encounter (HOSPITAL_COMMUNITY): Payer: Self-pay | Admitting: Psychiatry

## 2020-07-05 ENCOUNTER — Ambulatory Visit (INDEPENDENT_AMBULATORY_CARE_PROVIDER_SITE_OTHER): Payer: BC Managed Care – PPO | Admitting: Psychiatry

## 2020-07-05 DIAGNOSIS — F4310 Post-traumatic stress disorder, unspecified: Secondary | ICD-10-CM | POA: Diagnosis not present

## 2020-07-05 DIAGNOSIS — F331 Major depressive disorder, recurrent, moderate: Secondary | ICD-10-CM

## 2020-07-05 NOTE — Progress Notes (Signed)
Virtual Visit via Video Note  I connected with Renee Norris on 07/05/20 at 12:00 PM EST by a video enabled telemedicine application and verified that I am speaking with the correct person using two identifiers.  Location: Patient: Patient Home Provider: Home Office   GROUP GOAL: Client will attend IOP Aftercare Group Therapy 2-4x a month to connect with peers and to apply strategies discussed to improve mental health condition.  History of Present Illness: Depression and PTSD  Observations/Objective: Counselor met with Patient in the context of Group Therapy, via Webex. Counselor prompted Patient to share updates on coping skill application and individual therapeutic process in management of mental health. Client states that she is joining group to find additional support in decreasing her depressive symptoms. Client seeking support to better express the parts of her that depression suppresses. Counselor prompted Patient to share areas of concern, challenges and identify barriers to meeting current goals. Client shared that she desires to have a break from her depression and engaged in discussion as to how others manage depressive symptoms. Topics covered: engaging with support system in hard times, relationship dynamics, coping with grief, feelings of loneliness, and other's misunderstanding mental health. Patient engaged in discussion, provided feedback for others within the group and took note of additional strategies reviewed and discussed.   Assessment and Plan: Counselor recommends client continue following treatment plan goals, following crisis plan and following up with behavioral health and medical providers as needed. Patient is welcome to join group at next session.   Follow Up Instructions: Counselor to provide link for next session via Webex platform. The patient was advised to call back or seek an in-person evaluation if the symptoms worsen or if the condition fails to improve as  anticipated.  I provided 80 minutes of non-face-to-face time during this encounter.   Lise Auer, LCSW

## 2020-07-12 ENCOUNTER — Ambulatory Visit (INDEPENDENT_AMBULATORY_CARE_PROVIDER_SITE_OTHER): Payer: BC Managed Care – PPO | Admitting: Professional

## 2020-07-12 DIAGNOSIS — F4310 Post-traumatic stress disorder, unspecified: Secondary | ICD-10-CM | POA: Diagnosis not present

## 2020-07-21 ENCOUNTER — Other Ambulatory Visit: Payer: Self-pay | Admitting: Osteopathic Medicine

## 2020-07-26 ENCOUNTER — Ambulatory Visit (INDEPENDENT_AMBULATORY_CARE_PROVIDER_SITE_OTHER): Payer: BC Managed Care – PPO | Admitting: Professional

## 2020-07-26 DIAGNOSIS — F4321 Adjustment disorder with depressed mood: Secondary | ICD-10-CM

## 2020-07-31 ENCOUNTER — Other Ambulatory Visit: Payer: Self-pay | Admitting: Osteopathic Medicine

## 2020-07-31 ENCOUNTER — Encounter: Payer: Self-pay | Admitting: Osteopathic Medicine

## 2020-08-02 ENCOUNTER — Other Ambulatory Visit: Payer: Self-pay | Admitting: Nurse Practitioner

## 2020-08-04 ENCOUNTER — Other Ambulatory Visit: Payer: Self-pay

## 2020-08-04 ENCOUNTER — Encounter: Payer: Self-pay | Admitting: Osteopathic Medicine

## 2020-08-04 ENCOUNTER — Ambulatory Visit (INDEPENDENT_AMBULATORY_CARE_PROVIDER_SITE_OTHER): Payer: BC Managed Care – PPO | Admitting: Osteopathic Medicine

## 2020-08-04 VITALS — BP 115/87 | HR 64 | Temp 98.4°F | Wt 176.0 lb

## 2020-08-04 DIAGNOSIS — R41840 Attention and concentration deficit: Secondary | ICD-10-CM

## 2020-08-04 DIAGNOSIS — M5116 Intervertebral disc disorders with radiculopathy, lumbar region: Secondary | ICD-10-CM | POA: Diagnosis not present

## 2020-08-04 DIAGNOSIS — M4726 Other spondylosis with radiculopathy, lumbar region: Secondary | ICD-10-CM | POA: Diagnosis not present

## 2020-08-04 DIAGNOSIS — F331 Major depressive disorder, recurrent, moderate: Secondary | ICD-10-CM | POA: Diagnosis not present

## 2020-08-04 DIAGNOSIS — M4316 Spondylolisthesis, lumbar region: Secondary | ICD-10-CM | POA: Diagnosis not present

## 2020-08-04 DIAGNOSIS — M5416 Radiculopathy, lumbar region: Secondary | ICD-10-CM | POA: Diagnosis not present

## 2020-08-04 DIAGNOSIS — M545 Low back pain, unspecified: Secondary | ICD-10-CM | POA: Diagnosis not present

## 2020-08-04 NOTE — Patient Instructions (Addendum)
Will try Wellbutrin consistently 300 mg daily whether all at once of 150 mg twice per day.

## 2020-08-04 NOTE — Progress Notes (Signed)
Renee Norris is a 54 y.o. female who presents to  [facility] at [facility]  today, 08/04/20, seeking care for the following:  Concern for ADHD - pt reports her husband has noticed symptoms and suggested she get checked out. Renee Norris has history of mental health struggles with depression, anxiety and PTSD. She has recently been though intensive outpatient therapy, last visit 07/05/20 for group therapy, discharge summary from 06/30/20 reviewed in Epic. She is also seeing Teofilo Pod [facility]. She feels overall current medications are helping okay. Takes Wellbutrin 150-300 mg qhs. Difficulty w/ studying (she's in school for massage therapy but reports never really having a good idea what she wanted to do, bouncing around a lot w/ school over the years and when she was in her 8s).      ASSESSMENT & PLAN with other pertinent findings:  The primary encounter diagnosis was Concentration deficit. A diagnosis of Moderate episode of recurrent major depressive disorder (HCC) was also pertinent to this visit.   No results found for this or any previous visit (from the past 24 hour(s)).  Will refer for ADHD evaluation, I think worth knowing if something like this may be contributing to her concentration/motivation problems versus depression/anxiety alone or comborbid w/ ADD. For now recommended consistent use ot Wellbutrin rather than going back and forth w/ dose, may try taking in AM but she reports this causes drowsiness.      Patient Instructions  Will try Wellbutrin consistently 300 mg daily whether all at once of 150 mg twice per day.    Orders Placed This Encounter  Procedures  . Ambulatory referral to Behavioral Health    No orders of the defined types were placed in this encounter.      Follow-up instructions: Return for FOLLOW UP VIA MYCHART MESSAGE (SET TO SEND  LATER).                                         BP 115/87 (BP Location: Left Arm, Patient Position: Sitting, Cuff Size: Normal)   Pulse 64   Temp 98.4 F (36.9 C) (Oral)   Wt 176 lb 0.6 oz (79.9 kg)   LMP 04/21/2014 (Approximate)   BMI 30.22 kg/m   Current Meds  Medication Sig  . buPROPion (WELLBUTRIN XL) 300 MG 24 hr tablet Take 1 tablet (300 mg total) by mouth daily.  . diazepam (VALIUM) 10 MG tablet TAKE 0.5-1 TABLETS (5-10 MG TOTAL) BY MOUTH DAILY AS NEEDED FOR ANXIETY (#30 FOR 30 DAYS, TRY TO MAKE THIS LAST LONGER TO AVOID RISK OF TOLERANCE & DEPENDENCE).  Marland Kitchen estradiol (ESTRACE) 0.5 MG tablet TAKE 1 TABLET BY MOUTH EVERY DAY  . pantoprazole (PROTONIX) 20 MG tablet Take 1 tablet (20 mg total) by mouth daily.  . progesterone (PROMETRIUM) 100 MG capsule TAKE 1-2 CAPSULES (100-200 MG TOTAL) BY MOUTH DAILY.  Marland Kitchen sertraline (ZOLOFT) 50 MG tablet Take 1 tablet (50 mg total) by mouth daily.    No results found for this or any previous visit (from the past 72 hour(s)).  No results found.     All questions at time of visit were answered - patient instructed to contact office with any additional concerns or updates.  ER/RTC precautions were reviewed with the patient as applicable.   Please note: voice recognition software was used to produce this document, and typos may  escape review. Please contact Dr. Sheppard Coil for any needed clarifications.

## 2020-08-09 ENCOUNTER — Ambulatory Visit (INDEPENDENT_AMBULATORY_CARE_PROVIDER_SITE_OTHER): Payer: BC Managed Care – PPO | Admitting: Professional

## 2020-08-09 DIAGNOSIS — F4310 Post-traumatic stress disorder, unspecified: Secondary | ICD-10-CM | POA: Diagnosis not present

## 2020-08-18 ENCOUNTER — Other Ambulatory Visit: Payer: Self-pay | Admitting: Osteopathic Medicine

## 2020-08-18 DIAGNOSIS — M5117 Intervertebral disc disorders with radiculopathy, lumbosacral region: Secondary | ICD-10-CM | POA: Diagnosis not present

## 2020-08-18 DIAGNOSIS — M4316 Spondylolisthesis, lumbar region: Secondary | ICD-10-CM | POA: Diagnosis not present

## 2020-08-18 DIAGNOSIS — M5116 Intervertebral disc disorders with radiculopathy, lumbar region: Secondary | ICD-10-CM | POA: Diagnosis not present

## 2020-08-18 DIAGNOSIS — M4726 Other spondylosis with radiculopathy, lumbar region: Secondary | ICD-10-CM | POA: Diagnosis not present

## 2020-08-23 ENCOUNTER — Ambulatory Visit (INDEPENDENT_AMBULATORY_CARE_PROVIDER_SITE_OTHER): Payer: BC Managed Care – PPO | Admitting: Professional

## 2020-08-23 DIAGNOSIS — F4310 Post-traumatic stress disorder, unspecified: Secondary | ICD-10-CM | POA: Diagnosis not present

## 2020-08-30 ENCOUNTER — Other Ambulatory Visit: Payer: Self-pay | Admitting: Nurse Practitioner

## 2020-08-30 ENCOUNTER — Other Ambulatory Visit: Payer: Self-pay | Admitting: Osteopathic Medicine

## 2020-08-30 ENCOUNTER — Other Ambulatory Visit: Payer: Self-pay | Admitting: Sports Medicine

## 2020-08-30 DIAGNOSIS — M5116 Intervertebral disc disorders with radiculopathy, lumbar region: Secondary | ICD-10-CM | POA: Diagnosis not present

## 2020-08-30 DIAGNOSIS — M5416 Radiculopathy, lumbar region: Secondary | ICD-10-CM | POA: Diagnosis not present

## 2020-08-30 DIAGNOSIS — M25521 Pain in right elbow: Secondary | ICD-10-CM

## 2020-08-30 DIAGNOSIS — M4316 Spondylolisthesis, lumbar region: Secondary | ICD-10-CM | POA: Diagnosis not present

## 2020-08-30 DIAGNOSIS — M4726 Other spondylosis with radiculopathy, lumbar region: Secondary | ICD-10-CM | POA: Diagnosis not present

## 2020-08-30 NOTE — Telephone Encounter (Signed)
Refill provided on sertraline.

## 2020-08-31 DIAGNOSIS — Z20822 Contact with and (suspected) exposure to covid-19: Secondary | ICD-10-CM | POA: Diagnosis not present

## 2020-08-31 DIAGNOSIS — Z6825 Body mass index (BMI) 25.0-25.9, adult: Secondary | ICD-10-CM | POA: Diagnosis not present

## 2020-08-31 DIAGNOSIS — U071 COVID-19: Secondary | ICD-10-CM | POA: Diagnosis not present

## 2020-09-08 ENCOUNTER — Ambulatory Visit (INDEPENDENT_AMBULATORY_CARE_PROVIDER_SITE_OTHER): Payer: BC Managed Care – PPO | Admitting: Professional

## 2020-09-08 DIAGNOSIS — F4310 Post-traumatic stress disorder, unspecified: Secondary | ICD-10-CM

## 2020-09-15 ENCOUNTER — Ambulatory Visit (INDEPENDENT_AMBULATORY_CARE_PROVIDER_SITE_OTHER): Payer: BC Managed Care – PPO | Admitting: Professional

## 2020-09-15 DIAGNOSIS — F4310 Post-traumatic stress disorder, unspecified: Secondary | ICD-10-CM | POA: Diagnosis not present

## 2020-09-18 ENCOUNTER — Other Ambulatory Visit: Payer: Self-pay | Admitting: Osteopathic Medicine

## 2020-09-21 ENCOUNTER — Ambulatory Visit (INDEPENDENT_AMBULATORY_CARE_PROVIDER_SITE_OTHER): Payer: BC Managed Care – PPO | Admitting: Professional

## 2020-09-21 DIAGNOSIS — F4310 Post-traumatic stress disorder, unspecified: Secondary | ICD-10-CM | POA: Diagnosis not present

## 2020-09-28 DIAGNOSIS — M545 Low back pain, unspecified: Secondary | ICD-10-CM | POA: Diagnosis not present

## 2020-10-04 ENCOUNTER — Ambulatory Visit (INDEPENDENT_AMBULATORY_CARE_PROVIDER_SITE_OTHER): Payer: BC Managed Care – PPO | Admitting: Professional

## 2020-10-04 DIAGNOSIS — F4310 Post-traumatic stress disorder, unspecified: Secondary | ICD-10-CM

## 2020-10-07 ENCOUNTER — Ambulatory Visit: Payer: BC Managed Care – PPO | Admitting: Professional

## 2020-10-08 ENCOUNTER — Ambulatory Visit: Payer: BC Managed Care – PPO | Admitting: Professional

## 2020-10-10 DIAGNOSIS — M4316 Spondylolisthesis, lumbar region: Secondary | ICD-10-CM | POA: Diagnosis not present

## 2020-10-10 DIAGNOSIS — M5416 Radiculopathy, lumbar region: Secondary | ICD-10-CM | POA: Diagnosis not present

## 2020-10-10 DIAGNOSIS — F333 Major depressive disorder, recurrent, severe with psychotic symptoms: Secondary | ICD-10-CM | POA: Diagnosis not present

## 2020-10-14 ENCOUNTER — Other Ambulatory Visit: Payer: Self-pay | Admitting: Osteopathic Medicine

## 2020-10-14 ENCOUNTER — Encounter: Payer: Self-pay | Admitting: Osteopathic Medicine

## 2020-10-14 ENCOUNTER — Telehealth: Payer: BC Managed Care – PPO | Admitting: Family Medicine

## 2020-10-14 ENCOUNTER — Ambulatory Visit: Payer: BC Managed Care – PPO | Admitting: Professional

## 2020-10-14 DIAGNOSIS — B349 Viral infection, unspecified: Secondary | ICD-10-CM | POA: Diagnosis not present

## 2020-10-14 DIAGNOSIS — R197 Diarrhea, unspecified: Secondary | ICD-10-CM | POA: Diagnosis not present

## 2020-10-14 DIAGNOSIS — Z20822 Contact with and (suspected) exposure to covid-19: Secondary | ICD-10-CM | POA: Diagnosis not present

## 2020-10-14 DIAGNOSIS — Z79899 Other long term (current) drug therapy: Secondary | ICD-10-CM | POA: Diagnosis not present

## 2020-10-14 DIAGNOSIS — R0602 Shortness of breath: Secondary | ICD-10-CM | POA: Diagnosis not present

## 2020-10-14 DIAGNOSIS — R509 Fever, unspecified: Secondary | ICD-10-CM | POA: Diagnosis not present

## 2020-10-14 DIAGNOSIS — F99 Mental disorder, not otherwise specified: Secondary | ICD-10-CM | POA: Diagnosis not present

## 2020-10-14 DIAGNOSIS — J9811 Atelectasis: Secondary | ICD-10-CM | POA: Diagnosis not present

## 2020-10-15 ENCOUNTER — Ambulatory Visit (INDEPENDENT_AMBULATORY_CARE_PROVIDER_SITE_OTHER): Payer: BC Managed Care – PPO | Admitting: Professional

## 2020-10-15 DIAGNOSIS — F4310 Post-traumatic stress disorder, unspecified: Secondary | ICD-10-CM | POA: Diagnosis not present

## 2020-10-17 ENCOUNTER — Telehealth: Payer: Self-pay

## 2020-10-17 ENCOUNTER — Encounter: Payer: Self-pay | Admitting: Osteopathic Medicine

## 2020-10-17 NOTE — Telephone Encounter (Signed)
Transition Care Management Follow-up Telephone Call  Date of discharge and from where: 10/14/2020 from [facility]  How have you been since you were released from the hospital? Pt states that she is feeling okay today. Pt states that she has a back surgery coming up and was wondering if she needed to start a steriod with the sob that she states she gets from time to time. Pt informed to contact PCP office and surgeons office so they can evaluate further pt concerns.   Any questions or concerns? Yes see above.   Items Reviewed:  Did the pt receive and understand the discharge instructions provided? Yes   Medications obtained and verified? Yes   Other? No   Any new allergies since your discharge? No   Dietary orders reviewed? Heart Healthy  Do you have support at home? Yes   Functional Questionnaire: (I = Independent and D = Dependent) ADLs: I  Bathing/Dressing- I  Meal Prep- I  Eating- I  Maintaining continence- I  Transferring/Ambulation- I  Managing Meds- I   Follow up appointments reviewed:   PCP Hospital f/u appt confirmed? No Pt to call for an appt.  Specialist Hospital f/u appt confirmed? No    Are transportation arrangements needed? No   If their condition worsens, is the pt aware to call PCP or go to the Emergency Dept.? Yes  Was the patient provided with contact information for the PCP's office or ED? Yes  Was to pt encouraged to call back with questions or concerns? Yes

## 2020-10-17 NOTE — Telephone Encounter (Signed)
Transition Care Management Unsuccessful Follow-up Telephone Call  Date of discharge and from where:  10/14/2020 from [facility]  Attempts:  1st Attempt  Reason for unsuccessful TCM follow-up call:  Left voice message

## 2020-10-18 NOTE — Telephone Encounter (Signed)
Appointment has been made. No further questions at this time.  

## 2020-10-19 ENCOUNTER — Telehealth: Payer: BC Managed Care – PPO | Admitting: Osteopathic Medicine

## 2020-10-19 DIAGNOSIS — M47814 Spondylosis without myelopathy or radiculopathy, thoracic region: Secondary | ICD-10-CM | POA: Diagnosis not present

## 2020-10-19 DIAGNOSIS — R059 Cough, unspecified: Secondary | ICD-10-CM | POA: Diagnosis not present

## 2020-10-19 DIAGNOSIS — R0602 Shortness of breath: Secondary | ICD-10-CM

## 2020-10-19 DIAGNOSIS — R5383 Other fatigue: Secondary | ICD-10-CM | POA: Diagnosis not present

## 2020-10-19 DIAGNOSIS — Z0000 Encounter for general adult medical examination without abnormal findings: Secondary | ICD-10-CM

## 2020-10-19 DIAGNOSIS — M5124 Other intervertebral disc displacement, thoracic region: Secondary | ICD-10-CM | POA: Diagnosis not present

## 2020-10-19 DIAGNOSIS — M546 Pain in thoracic spine: Secondary | ICD-10-CM | POA: Diagnosis not present

## 2020-10-19 DIAGNOSIS — E7801 Familial hypercholesterolemia: Secondary | ICD-10-CM | POA: Diagnosis not present

## 2020-10-19 DIAGNOSIS — M5184 Other intervertebral disc disorders, thoracic region: Secondary | ICD-10-CM | POA: Diagnosis not present

## 2020-10-19 DIAGNOSIS — B349 Viral infection, unspecified: Secondary | ICD-10-CM

## 2020-10-19 DIAGNOSIS — M4804 Spinal stenosis, thoracic region: Secondary | ICD-10-CM | POA: Diagnosis not present

## 2020-10-19 NOTE — Patient Instructions (Signed)
Medications & Home Remedies for Upper Respiratory Illness   Note: the following list assumes no pregnancy, normal liver & kidney function and no other drug interactions. Dr. Shareese Macha has highlighted medications which are safe for you to use, but these may not be appropriate for everyone. Always ask a pharmacist or qualified medical provider if you have any questions!    Aches/Pains, Fever, Headache OTC Acetaminophen (Tylenol) 500 mg tablets - take max 2 tablets (1000 mg) every 6 hours (4 times per day)  OTC Ibuprofen (Motrin) 200 mg tablets - take max 4 tablets (800 mg) every 6 hours*   Sinus Congestion OTC Nasal Saline if desired to rinse OTC Oxymetolazone (Afrin, others) sparing use due to rebound congestion, NEVER use in kids OTC Phenylephrine (Sudafed) 10 mg tablets every 4 hours (or the 12-hour formulation)* OTC Diphenhydramine (Benadryl) 25 mg tablets - take max 2 tablets every 4 hours   Cough & Sore Throat OTC Dextromethorphan (Robitussin, others) - cough suppressant OTC Guaifenesin (Robitussin, Mucinex, others) - expectorant (helps cough up mucus) (Dextromethorphan and Guaifenesin also come in a combination tablet/syrup) OTC Lozenges w/ Benzocaine + Menthol (Cepacol) Honey - as much as you want! Teas which "coat the throat" - look for ingredients Elm Bark, Licorice Root, Marshmallow Root   Other OTC Zinc Lozenges within 24 hours of symptoms onset - mixed evidence this shortens the duration of the common cold Don't waste your money on Vitamin C or Echinacea in acute illness - it's already too late!    *Caution in patients with high blood pressure          

## 2020-10-19 NOTE — Progress Notes (Signed)
Telemedicine Visit via  Video & Audio (App used: MyChar)  I connected with Renee Norris on 10/19/20 at 11:10 AM  by phone or  telemedicine application as noted above  I verified that I am speaking with or regarding  the correct patient using two identifiers.  Participants: Myself, Dr Emeterio Reeve DO Patient: Renee Norris Patient proxy if applicable: none Other, if applicable: none  Patient is at home I am in office at [facility]    I discussed the limitations of evaluation and management  by telemedicine and the availability of in person appointments.  The participant(s) above expressed understanding and  agreed to proceed with this appointment via telemedicine.       History of Present Illness: Renee Norris is a 55 y.o. female who would like to discuss follow up from hospital visit: fatigue, SOB w/ nonproductive cough, diarrhea, loss of taste onset 10/13/20, messaged our office 10/14/20 for appt, was scheduled for virtual but needed up going to ED. COVID, Flu A/B, RSV were not detected. CXR showed mild atelectasis. ED recommended OTC Imodium, cough meds.Pt reports improvement since then, no additional issues w/ SOB   Immunization History  Administered Date(s) Administered  . Hepatitis B, adult 05/01/2019, 05/29/2019  . Influenza,inj,Quad PF,6+ Mos 05/22/2017, 04/22/2018, 04/24/2019  . MMR 05/01/2019, 05/29/2019  . Moderna Sars-Covid-2 Vaccination 10/06/2019, 10/29/2019  . Tdap 01/05/2015  . Zoster Recombinat (Shingrix) 04/24/2019    Observations/Objective: LMP 04/21/2014 (Approximate)  BP Readings from Last 3 Encounters:  08/04/20 115/87  03/16/20 120/78  05/29/19 107/74   Exam: Normal Speech. NAD  Lab and Radiology Results No results found for this or any previous visit (from the past 72 hour(s)). No results found.     Assessment and Plan: 55 y.o. female with The primary encounter diagnosis was Viral syndrome. Diagnoses of Annual  physical exam and Familial hypercholesterolemia were also pertinent to this visit.  Labs ordered for future visit. Annual physical / preventive care was NOT performed or billed today.   Pt improving from viral illness Would say could have been COVID given resp/GI issues and loss taste, thankfully pt improving well and no further issues    PDMP not reviewed this encounter. Orders Placed This Encounter  Procedures  . CBC  . COMPLETE METABOLIC PANEL WITH GFR  . Lipid panel   No orders of the defined types were placed in this encounter.  There are no Patient Instructions on file for this visit.  Instructions sent via MyChart.   Follow Up Instructions: No follow-ups on file.    I discussed the assessment and treatment plan with the patient. The patient was provided an opportunity to ask questions and all were answered. The patient agreed with the plan and demonstrated an understanding of the instructions.   The patient was advised to call back or seek an in-person evaluation if any new concerns, if symptoms worsen or if the condition fails to improve as anticipated.  15 minutes of non-face-to-face time was provided during this encounter.      . . . . . . . . . . . . . Marland Kitchen                   Historical information moved to improve visibility of documentation.  Past Medical History:  Diagnosis Date  . Chronic back pain 01/30/2016   folowing with Ortho for Tramadol, we are managing benzo therapy, started TCA as well, refer to PT, refer to [facility] for  CBT   . Depression 01/30/2016   TCA to help this as well as pain/sleep issues, refer for CBT   . Insomnia 01/30/2016   TCA, do not take with benzos   . Postmenopausal 01/30/2016   HRT from OBGYN    Past Surgical History:  Procedure Laterality Date  . ABDOMINAL SURGERY    . AUGMENTATION MAMMAPLASTY    . BREAST SURGERY    . GYN surgery    . TONSILLECTOMY     Social History   Tobacco Use  .  Smoking status: Former Research scientist (life sciences)  . Smokeless tobacco: Never Used  Substance Use Topics  . Alcohol use: No    Comment: 2-3 glasses of wine a day   family history includes Cancer in her father and mother; Diabetes in her father and mother; Hypertension in her father and mother.  Medications: Current Outpatient Medications  Medication Sig Dispense Refill  . buPROPion (WELLBUTRIN XL) 300 MG 24 hr tablet TAKE 1 TABLET BY MOUTH EVERY DAY 90 tablet 1  . diazepam (VALIUM) 10 MG tablet TAKE 0.5-1 TABLETS (5-10 MG TOTAL) BY MOUTH DAILY AS NEEDED FOR ANXIETY (#30 FOR 30 DAYS, TRY TO MAKE THIS LAST LONGER TO AVOID RISK OF TOLERANCE & DEPENDENCE). 30 tablet 2  . Diclofenac Sodium 2 % SOLN Place 2 sprays onto the skin 2 (two) times daily. (Patient not taking: No sig reported)    . estradiol (ESTRACE) 0.5 MG tablet TAKE 1 TABLET BY MOUTH EVERY DAY 90 tablet 3  . lamoTRIgine (LAMICTAL) 100 MG tablet TAKE 1 TABLET BY MOUTH EVERY DAY 90 tablet 1  . meloxicam (MOBIC) 15 MG tablet TAKE 1 TABLET BY MOUTH EVERY MORNING WITH A MEAL FOR 2 WEEKS, THEN DAILY AS NEEDED FOR PAIN 30 tablet 3  . pantoprazole (PROTONIX) 20 MG tablet Take 1 tablet (20 mg total) by mouth daily. 90 tablet 3  . progesterone (PROMETRIUM) 100 MG capsule TAKE 1-2 CAPSULES (100-200 MG TOTAL) BY MOUTH DAILY. 180 capsule 1  . sertraline (ZOLOFT) 50 MG tablet TAKE 1 TABLET BY MOUTH EVERY DAY 90 tablet 0   No current facility-administered medications for this visit.   Allergies  Allergen Reactions  . Aspirin Other (See Comments)    Dizziness/swelling     If phone visit, billing and coding can please add appropriate modifier if needed

## 2020-10-22 ENCOUNTER — Ambulatory Visit: Payer: BC Managed Care – PPO | Admitting: Professional

## 2020-10-29 ENCOUNTER — Ambulatory Visit: Payer: BC Managed Care – PPO | Admitting: Professional

## 2020-11-02 DIAGNOSIS — G894 Chronic pain syndrome: Secondary | ICD-10-CM | POA: Diagnosis not present

## 2020-11-03 ENCOUNTER — Ambulatory Visit: Payer: BC Managed Care – PPO | Admitting: Professional

## 2020-11-05 ENCOUNTER — Ambulatory Visit: Payer: BC Managed Care – PPO | Admitting: Professional

## 2020-11-07 DIAGNOSIS — F333 Major depressive disorder, recurrent, severe with psychotic symptoms: Secondary | ICD-10-CM | POA: Diagnosis not present

## 2020-11-08 ENCOUNTER — Ambulatory Visit: Payer: BC Managed Care – PPO | Admitting: Psychology

## 2020-11-12 ENCOUNTER — Ambulatory Visit: Payer: BC Managed Care – PPO | Admitting: Professional

## 2020-11-17 ENCOUNTER — Telehealth: Payer: Self-pay | Admitting: Osteopathic Medicine

## 2020-11-17 DIAGNOSIS — M5116 Intervertebral disc disorders with radiculopathy, lumbar region: Secondary | ICD-10-CM | POA: Diagnosis not present

## 2020-11-17 DIAGNOSIS — M4316 Spondylolisthesis, lumbar region: Secondary | ICD-10-CM | POA: Diagnosis not present

## 2020-11-17 DIAGNOSIS — M4726 Other spondylosis with radiculopathy, lumbar region: Secondary | ICD-10-CM | POA: Diagnosis not present

## 2020-11-17 DIAGNOSIS — G894 Chronic pain syndrome: Secondary | ICD-10-CM | POA: Diagnosis not present

## 2020-11-17 NOTE — Telephone Encounter (Signed)
Patient dropped off form to be filled out by PCP and faxed. Billing form placed on top of form and form placed in provider box. AM *11/17/20*

## 2020-11-23 ENCOUNTER — Ambulatory Visit: Payer: BC Managed Care – PPO | Admitting: Psychology

## 2020-11-26 ENCOUNTER — Encounter: Payer: Self-pay | Admitting: Osteopathic Medicine

## 2020-12-23 DIAGNOSIS — M5116 Intervertebral disc disorders with radiculopathy, lumbar region: Secondary | ICD-10-CM | POA: Diagnosis not present

## 2020-12-23 DIAGNOSIS — M4316 Spondylolisthesis, lumbar region: Secondary | ICD-10-CM | POA: Diagnosis not present

## 2020-12-23 DIAGNOSIS — G894 Chronic pain syndrome: Secondary | ICD-10-CM | POA: Diagnosis not present

## 2020-12-23 DIAGNOSIS — M4726 Other spondylosis with radiculopathy, lumbar region: Secondary | ICD-10-CM | POA: Diagnosis not present

## 2020-12-26 DIAGNOSIS — G894 Chronic pain syndrome: Secondary | ICD-10-CM | POA: Diagnosis not present

## 2020-12-26 DIAGNOSIS — Z01818 Encounter for other preprocedural examination: Secondary | ICD-10-CM | POA: Diagnosis not present

## 2020-12-26 DIAGNOSIS — I498 Other specified cardiac arrhythmias: Secondary | ICD-10-CM | POA: Diagnosis not present

## 2021-01-02 DIAGNOSIS — M4316 Spondylolisthesis, lumbar region: Secondary | ICD-10-CM | POA: Diagnosis not present

## 2021-01-02 DIAGNOSIS — G629 Polyneuropathy, unspecified: Secondary | ICD-10-CM | POA: Diagnosis not present

## 2021-01-02 DIAGNOSIS — M5116 Intervertebral disc disorders with radiculopathy, lumbar region: Secondary | ICD-10-CM | POA: Diagnosis not present

## 2021-01-02 DIAGNOSIS — Z462 Encounter for fitting and adjustment of other devices related to nervous system and special senses: Secondary | ICD-10-CM | POA: Diagnosis not present

## 2021-01-02 DIAGNOSIS — G894 Chronic pain syndrome: Secondary | ICD-10-CM | POA: Diagnosis not present

## 2021-01-02 DIAGNOSIS — M5416 Radiculopathy, lumbar region: Secondary | ICD-10-CM | POA: Diagnosis not present

## 2021-01-02 DIAGNOSIS — M4726 Other spondylosis with radiculopathy, lumbar region: Secondary | ICD-10-CM | POA: Diagnosis not present

## 2021-01-02 DIAGNOSIS — Z969 Presence of functional implant, unspecified: Secondary | ICD-10-CM | POA: Diagnosis not present

## 2021-01-02 DIAGNOSIS — G8929 Other chronic pain: Secondary | ICD-10-CM | POA: Diagnosis not present

## 2021-01-03 DIAGNOSIS — G629 Polyneuropathy, unspecified: Secondary | ICD-10-CM | POA: Diagnosis not present

## 2021-01-03 DIAGNOSIS — Z969 Presence of functional implant, unspecified: Secondary | ICD-10-CM | POA: Diagnosis not present

## 2021-01-03 DIAGNOSIS — G8929 Other chronic pain: Secondary | ICD-10-CM | POA: Diagnosis not present

## 2021-01-03 DIAGNOSIS — Z462 Encounter for fitting and adjustment of other devices related to nervous system and special senses: Secondary | ICD-10-CM | POA: Diagnosis not present

## 2021-01-03 DIAGNOSIS — M4726 Other spondylosis with radiculopathy, lumbar region: Secondary | ICD-10-CM | POA: Diagnosis not present

## 2021-01-19 ENCOUNTER — Other Ambulatory Visit: Payer: Self-pay | Admitting: Osteopathic Medicine

## 2021-01-30 DIAGNOSIS — F333 Major depressive disorder, recurrent, severe with psychotic symptoms: Secondary | ICD-10-CM | POA: Diagnosis not present

## 2021-01-31 DIAGNOSIS — M4316 Spondylolisthesis, lumbar region: Secondary | ICD-10-CM | POA: Diagnosis not present

## 2021-02-03 ENCOUNTER — Ambulatory Visit (INDEPENDENT_AMBULATORY_CARE_PROVIDER_SITE_OTHER): Payer: BC Managed Care – PPO | Admitting: Professional

## 2021-02-03 DIAGNOSIS — F4310 Post-traumatic stress disorder, unspecified: Secondary | ICD-10-CM | POA: Diagnosis not present

## 2021-02-10 ENCOUNTER — Other Ambulatory Visit: Payer: Self-pay | Admitting: Nurse Practitioner

## 2021-02-11 ENCOUNTER — Other Ambulatory Visit: Payer: Self-pay | Admitting: Nurse Practitioner

## 2021-02-11 ENCOUNTER — Encounter: Payer: Self-pay | Admitting: Osteopathic Medicine

## 2021-02-11 DIAGNOSIS — M25521 Pain in right elbow: Secondary | ICD-10-CM

## 2021-02-13 ENCOUNTER — Other Ambulatory Visit: Payer: Self-pay

## 2021-02-13 MED ORDER — SERTRALINE HCL 50 MG PO TABS: 50.0000 mg | ORAL_TABLET | Freq: Every day | ORAL | 0 refills | Status: DC

## 2021-02-13 MED ORDER — BUPROPION HCL ER (XL) 300 MG PO TB24: 300.0000 mg | ORAL_TABLET | Freq: Every day | ORAL | 1 refills | Status: DC

## 2021-02-13 MED ORDER — MELOXICAM 15 MG PO TABS: ORAL_TABLET | ORAL | 3 refills | Status: DC

## 2021-03-15 DIAGNOSIS — M5116 Intervertebral disc disorders with radiculopathy, lumbar region: Secondary | ICD-10-CM | POA: Diagnosis not present

## 2021-03-15 DIAGNOSIS — M25551 Pain in right hip: Secondary | ICD-10-CM | POA: Diagnosis not present

## 2021-03-15 DIAGNOSIS — M5416 Radiculopathy, lumbar region: Secondary | ICD-10-CM | POA: Diagnosis not present

## 2021-04-26 DIAGNOSIS — T85113A Breakdown (mechanical) of implanted electronic neurostimulator, generator, initial encounter: Secondary | ICD-10-CM | POA: Diagnosis not present

## 2021-04-26 DIAGNOSIS — M5116 Intervertebral disc disorders with radiculopathy, lumbar region: Secondary | ICD-10-CM | POA: Diagnosis not present

## 2021-05-02 DIAGNOSIS — F333 Major depressive disorder, recurrent, severe with psychotic symptoms: Secondary | ICD-10-CM | POA: Diagnosis not present

## 2021-05-07 ENCOUNTER — Other Ambulatory Visit: Payer: Self-pay | Admitting: Nurse Practitioner

## 2021-05-11 ENCOUNTER — Other Ambulatory Visit: Payer: Self-pay | Admitting: Osteopathic Medicine

## 2021-05-17 DIAGNOSIS — T85113A Breakdown (mechanical) of implanted electronic neurostimulator, generator, initial encounter: Secondary | ICD-10-CM | POA: Diagnosis not present

## 2021-05-17 DIAGNOSIS — Y752 Prosthetic and other implants, materials and neurological devices associated with adverse incidents: Secondary | ICD-10-CM | POA: Diagnosis not present

## 2021-05-17 DIAGNOSIS — Z01812 Encounter for preprocedural laboratory examination: Secondary | ICD-10-CM | POA: Diagnosis not present

## 2021-05-18 DIAGNOSIS — M5116 Intervertebral disc disorders with radiculopathy, lumbar region: Secondary | ICD-10-CM | POA: Diagnosis not present

## 2021-05-18 DIAGNOSIS — M25551 Pain in right hip: Secondary | ICD-10-CM | POA: Diagnosis not present

## 2021-05-18 DIAGNOSIS — T85113A Breakdown (mechanical) of implanted electronic neurostimulator, generator, initial encounter: Secondary | ICD-10-CM | POA: Diagnosis not present

## 2021-05-22 DIAGNOSIS — T85113A Breakdown (mechanical) of implanted electronic neurostimulator, generator, initial encounter: Secondary | ICD-10-CM | POA: Diagnosis not present

## 2021-05-22 DIAGNOSIS — M5116 Intervertebral disc disorders with radiculopathy, lumbar region: Secondary | ICD-10-CM | POA: Diagnosis not present

## 2021-05-22 DIAGNOSIS — G894 Chronic pain syndrome: Secondary | ICD-10-CM | POA: Diagnosis not present

## 2021-05-22 DIAGNOSIS — Z9682 Presence of neurostimulator: Secondary | ICD-10-CM | POA: Diagnosis not present

## 2021-05-22 DIAGNOSIS — T85112A Breakdown (mechanical) of implanted electronic neurostimulator (electrode) of spinal cord, initial encounter: Secondary | ICD-10-CM | POA: Diagnosis not present

## 2021-05-22 DIAGNOSIS — Y752 Prosthetic and other implants, materials and neurological devices associated with adverse incidents: Secondary | ICD-10-CM | POA: Diagnosis not present

## 2021-05-30 ENCOUNTER — Emergency Department (INDEPENDENT_AMBULATORY_CARE_PROVIDER_SITE_OTHER)
Admission: EM | Admit: 2021-05-30 | Discharge: 2021-05-30 | Disposition: A | Discharge status: Home / Self Care | Payer: BC Managed Care – PPO | Source: Home / Self Care | Attending: Family Medicine | Admitting: Family Medicine

## 2021-05-30 ENCOUNTER — Other Ambulatory Visit: Payer: Self-pay

## 2021-05-30 DIAGNOSIS — B309 Viral conjunctivitis, unspecified: Secondary | ICD-10-CM

## 2021-05-30 HISTORY — DX: Other specified behavioral and emotional disorders with onset usually occurring in childhood and adolescence: F98.8

## 2021-05-30 MED ORDER — CARBOXYMETHYLCELLULOSE SODIUM 1 % OP SOLN: 1.0000 [drp] | Freq: Three times a day (TID) | OPHTHALMIC | 12 refills | Status: DC

## 2021-05-30 MED ORDER — OLOPATADINE HCL 0.1 % OP SOLN: 1.0000 [drp] | Freq: Two times a day (BID) | OPHTHALMIC | 12 refills | Status: DC

## 2021-05-30 NOTE — ED Provider Notes (Signed)
[name]    CSN: [id number] Arrival date & time: 05/30/21  1706      History   Chief Complaint Chief Complaint  Patient presents with   Eye Pain    Rt pain   Oral Swelling    HPI Renee Norris is a 55 y.o. female.   HPI Short of this morning with red eye on the right.  It is light sensitive.  Tearing.  No discharge.  No visual disturbance.  No trauma.  No foreign body sensation.  She does not wear contact lenses.  She has not had any cold symptoms. Patient had spine surgery last week to remove a spinal cord stimulator.  She is using a TENS unit.  She states she is healing well  Past Medical History:  Diagnosis Date   ADD (attention deficit disorder)    Chronic back pain 01/30/2016   folowing with Ortho for Tramadol, we are managing benzo therapy, started TCA as well, refer to PT, refer to [facility] for CBT    Depression 01/30/2016   TCA to help this as well as pain/sleep issues, refer for CBT    Insomnia 01/30/2016   TCA, do not take with benzos    Postmenopausal 01/30/2016   HRT from OBGYN     Patient Active Problem List   Diagnosis Date Noted   [place]'s nodes 03/02/2020   Right elbow pain 12/31/2019   Familial hypercholesterolemia 01/16/2019   Heartburn 01/16/2019   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   Spondylosis without myelopathy or radiculopathy, lumbar region 05/10/2016   Chronic midline low back pain without sciatica 04/11/2016   Sinus bradycardia, chronic 04/04/2016   Lumbar spinal stenosis 03/29/2016   H/O mammogram 02/14/2016   History of bradycardia 02/14/2016   Chronic back pain 01/30/2016   Depression 01/30/2016   Cephalalgia 01/30/2016   Other fatigue 01/30/2016   Postmenopausal 01/30/2016   Annual physical exam 01/05/2015    Past Surgical History:  Procedure Laterality Date   ABDOMINAL SURGERY     AUGMENTATION MAMMAPLASTY     BACK SURGERY     BREAST SURGERY     GYN surgery     TONSILLECTOMY      OB  History   No obstetric history on file.      Home Medications    Prior to Admission medications   Medication Sig Start Date End Date Taking? Authorizing Provider  amphetamine-dextroamphetamine (ADDERALL) 20 MG tablet Take 20 mg by mouth daily.   Yes [provider]  carboxymethylcellulose 1 % ophthalmic solution Apply 1 drop to eye 3 (three) times daily. 05/30/21  Yes Eustace Moore, MD  olopatadine (PATANOL) 0.1 % ophthalmic solution Place 1 drop into both eyes 2 (two) times daily. 05/30/21  Yes Eustace Moore, MD  buPROPion (WELLBUTRIN XL) 300 MG 24 hr tablet Take 1 tablet (300 mg total) by mouth daily. 02/13/21   Sunnie Nielsen, DO  diazepam (VALIUM) 10 MG tablet TAKE 0.5-1 TABLETS (5-10 MG TOTAL) BY MOUTH DAILY AS NEEDED FOR ANXIETY (#30 FOR 30 DAYS, TRY TO MAKE THIS LAST LONGER TO AVOID RISK OF TOLERANCE & DEPENDENCE). 01/20/21   Sunnie Nielsen, DO  meloxicam (MOBIC) 15 MG tablet 1 tab p.o. daily with a meal as needed for pain 02/13/21   Monica Becton, MD  pantoprazole (PROTONIX) 20 MG tablet Take 1 tablet (20 mg total) by mouth daily. 06/28/20   Sunnie Nielsen, DO    Family History Family History  Problem Relation Age of  Onset   Diabetes Mother    Hypertension Mother    Cancer Mother    Diabetes Father    Hypertension Father    Cancer Father     Social History Social History   Tobacco Use   Smoking status: Former   Smokeless tobacco: Never  Substance Use Topics   Alcohol use: No    Comment: 2-3 glasses of wine a day   Drug use: No     Allergies   Aspirin   Review of Systems Review of Systems See HPI  Physical Exam Triage Vital Signs ED Triage Vitals  Enc Vitals Group     BP 05/30/21 1715 105/73     Pulse Rate 05/30/21 1715 83     Resp 05/30/21 1715 14     Temp 05/30/21 1715 98 F (36.7 C)     Temp Source 05/30/21 1715 Oral     SpO2 05/30/21 1715 96 %     Weight --      Height --      Head Circumference --       Peak Flow --      Pain Score 05/30/21 1717 1     Pain Loc --      Pain Edu? --      Excl. in GC? --    No data found.  Updated Vital Signs BP 105/73 (BP Location: Left Arm)   Pulse 83   Temp 98 F (36.7 C) (Oral)   Resp 14   LMP 04/21/2014 (Approximate)   SpO2 96%   Visual Acuity Right Eye Distance: 20/50 Left Eye Distance: 20/70 Bilateral Distance: 20/40     Physical Exam Constitutional:      General: She is not in acute distress.    Appearance: She is well-developed.  HENT:     Head: Normocephalic and atraumatic.  Eyes:     General:        Right eye: Discharge present.     Pupils: Pupils are equal, round, and reactive to light.     Comments: Right conjunctive is moderately injected.  No foreign body.  No corneal abrasion.  No crusting.  Moderate tearing.  Mild photophobia  Cardiovascular:     Rate and Rhythm: Normal rate.  Pulmonary:     Effort: Pulmonary effort is normal. No respiratory distress.  Abdominal:     General: There is no distension.     Palpations: Abdomen is soft.  Musculoskeletal:        General: Normal range of motion.     Cervical back: Normal range of motion.  Skin:    General: Skin is warm and dry.  Neurological:     Mental Status: She is alert.     UC Treatments / Results  Labs (all labs ordered are listed, but only abnormal results are displayed) Labs Reviewed - No data to display  EKG   Radiology No results found.  Procedures Procedures (including critical care time)  Medications Ordered in UC Medications - No data to display  Initial Impression / Assessment and Plan / UC Course  I have reviewed the triage vital signs and the nursing notes.  Pertinent labs & imaging results that were available during my care of the patient were reviewed by me and considered in my medical decision making (see chart for details).     Reviewed types of conjunctivitis.  I feel this is likely a viral conjunctivitis.  We will treat  accordingly Final Clinical Impressions(s) / UC Diagnoses  Final diagnoses:  Acute viral conjunctivitis of right eye     Discharge Instructions      Use the olopatadine eyedrops twice a day.  This is an antihistamine to reduce the itching and irritation Use a lubricant eyedrop like refresh or Systane as often as needed.  May place these drops in every couple of hours while awake Expect improvement over time See your eye doctor if worse instead of better   ED Prescriptions     Medication Sig Dispense Auth. Provider   olopatadine (PATANOL) 0.1 % ophthalmic solution Place 1 drop into both eyes 2 (two) times daily. 5 mL Eustace Moore, MD   carboxymethylcellulose 1 % ophthalmic solution Apply 1 drop to eye 3 (three) times daily. 30 mL Eustace Moore, MD      PDMP not reviewed this encounter.   Eustace Moore, MD 05/30/21 [phone]

## 2021-05-30 NOTE — Discharge Instructions (Addendum)
Use the olopatadine eyedrops twice a day.  This is an antihistamine to reduce the itching and irritation Use a lubricant eyedrop like refresh or Systane as often as needed.  May place these drops in every couple of hours while awake Expect improvement over time See your eye doctor if worse instead of better

## 2021-05-30 NOTE — ED Triage Notes (Signed)
Pt presents with rt eye redness and tongue swelling that began this morning. Pt states vision is blurry pr denies any difficulty swallowing

## 2021-06-05 ENCOUNTER — Ambulatory Visit (INDEPENDENT_AMBULATORY_CARE_PROVIDER_SITE_OTHER): Payer: BC Managed Care – PPO | Admitting: Professional

## 2021-06-05 DIAGNOSIS — F4310 Post-traumatic stress disorder, unspecified: Secondary | ICD-10-CM | POA: Diagnosis not present

## 2021-06-12 ENCOUNTER — Ambulatory Visit (INDEPENDENT_AMBULATORY_CARE_PROVIDER_SITE_OTHER): Payer: BC Managed Care – PPO | Admitting: Professional

## 2021-06-12 DIAGNOSIS — F4310 Post-traumatic stress disorder, unspecified: Secondary | ICD-10-CM | POA: Diagnosis not present

## 2021-06-19 ENCOUNTER — Ambulatory Visit (INDEPENDENT_AMBULATORY_CARE_PROVIDER_SITE_OTHER): Payer: BC Managed Care – PPO | Admitting: Professional

## 2021-06-19 DIAGNOSIS — F4310 Post-traumatic stress disorder, unspecified: Secondary | ICD-10-CM

## 2021-06-26 ENCOUNTER — Ambulatory Visit (INDEPENDENT_AMBULATORY_CARE_PROVIDER_SITE_OTHER): Payer: BC Managed Care – PPO | Admitting: Professional

## 2021-06-26 DIAGNOSIS — F4310 Post-traumatic stress disorder, unspecified: Secondary | ICD-10-CM | POA: Diagnosis not present

## 2021-06-28 ENCOUNTER — Encounter: Payer: BC Managed Care – PPO | Admitting: Medical-Surgical

## 2021-06-28 NOTE — Progress Notes (Signed)
This encounter was created in error - please disregard.

## 2021-07-03 ENCOUNTER — Ambulatory Visit (INDEPENDENT_AMBULATORY_CARE_PROVIDER_SITE_OTHER): Payer: BC Managed Care – PPO | Admitting: Professional

## 2021-07-03 DIAGNOSIS — F4310 Post-traumatic stress disorder, unspecified: Secondary | ICD-10-CM | POA: Diagnosis not present

## 2021-07-05 ENCOUNTER — Encounter: Payer: Self-pay | Admitting: Family Medicine

## 2021-07-05 ENCOUNTER — Telehealth (INDEPENDENT_AMBULATORY_CARE_PROVIDER_SITE_OTHER): Payer: BC Managed Care – PPO | Admitting: Family Medicine

## 2021-07-05 DIAGNOSIS — J209 Acute bronchitis, unspecified: Secondary | ICD-10-CM | POA: Insufficient documentation

## 2021-07-05 DIAGNOSIS — J208 Acute bronchitis due to other specified organisms: Secondary | ICD-10-CM | POA: Diagnosis not present

## 2021-07-05 MED ORDER — PREDNISONE 50 MG PO TABS: ORAL_TABLET | ORAL | 0 refills | Status: DC

## 2021-07-05 MED ORDER — DOXYCYCLINE HYCLATE 100 MG PO TABS: 100.0000 mg | ORAL_TABLET | Freq: Two times a day (BID) | ORAL | 0 refills | Status: AC

## 2021-07-05 MED ORDER — HYDROCODONE BIT-HOMATROP MBR 5-1.5 MG/5ML PO SOLN: 5.0000 mL | Freq: Three times a day (TID) | ORAL | 0 refills | Status: DC | PRN

## 2021-07-05 NOTE — Assessment & Plan Note (Addendum)
Start a course of doxycycline along with 5 days of prednisone 50 mg daily.  Adding Hycodan syrup as needed.  Recommend increase fluids in addition to Mucinex.  Instructed to contact clinic if symptoms are not improving or if worsening.  We also discussed taking COVID test if symptoms worsen.

## 2021-07-05 NOTE — Progress Notes (Signed)
Symptoms started Thursday.   Currently: SOB, nasal congestion, dry cough  Medications: allergy relief, excedrin

## 2021-07-05 NOTE — Progress Notes (Signed)
Renee Norris - 55 y.o. female MRN [id number]  Date of birth: 05-17-1966   This visit type was conducted due to national recommendations for restrictions regarding the COVID-19 Pandemic (e.g. social distancing).  This format is felt to be most appropriate for this patient at this time.  All issues noted in this document were discussed and addressed.  No physical exam was performed (except for noted visual exam findings with Video Visits).  I discussed the limitations of evaluation and management by telemedicine and the availability of in person appointments. The patient expressed understanding and agreed to proceed.  I connected withNAME@ on 07/05/21 at  1:10 PM EST by a video enabled telemedicine application and verified that I am speaking with the correct person using two identifiers.  Present at visit: Everrett Coombe, DO [name]   Patient Location: Home [address] [place] [address]   Provider location:   [facility]  Chief Complaint  Patient presents with   URI    HPI  Renee Norris is a 55 y.o. female who presents via audio/video conferencing for a telehealth visit today.  She has complaint of nasal, dry cough, dyspnea and fatigue.  She did have fever at initial onset of symptoms which was approximately 5 to 6 days ago.  Fever has resolved at this point however she continues to have other symptoms.  She has noted some wheezing at times.  She has not taken a home COVID test.  She denies any GI symptoms   ROS:  A comprehensive ROS was completed and negative except as noted per HPI  Past Medical History:  Diagnosis Date   ADD (attention deficit disorder)    Chronic back pain 01/30/2016   folowing with Ortho for Tramadol, we are managing benzo therapy, started TCA as well, refer to PT, refer to [facility] for CBT    Depression 01/30/2016   TCA to help this as well as pain/sleep issues, refer for CBT    Insomnia 01/30/2016   TCA, do not take with benzos     Postmenopausal 01/30/2016   HRT from OBGYN     Past Surgical History:  Procedure Laterality Date   ABDOMINAL SURGERY     AUGMENTATION MAMMAPLASTY     BACK SURGERY     BREAST SURGERY     GYN surgery     TONSILLECTOMY      Family History  Problem Relation Age of Onset   Diabetes Mother    Hypertension Mother    Cancer Mother    Diabetes Father    Hypertension Father    Cancer Father     Social History   Socioeconomic History   Marital status: Married    Spouse name: Not on file   Number of children: Not on file   Years of education: Not on file   Highest education level: Not on file  Occupational History   Not on file  Tobacco Use   Smoking status: Former   Smokeless tobacco: Never  Substance and Sexual Activity   Alcohol use: No    Comment: 2-3 glasses of wine a day   Drug use: No   Sexual activity: Not on file  Other Topics Concern   Not on file  Social History Narrative   Not on file   Social Determinants of Health   Financial Resource Strain: Not on file  Food Insecurity: Not on file  Transportation Needs: Not on file  Physical Activity: Not on file  Stress: Not on file  Social Connections: Not on file  Intimate Partner Violence: Not on file     Current Outpatient Medications:    doxycycline (VIBRA-TABS) 100 MG tablet, Take 1 tablet (100 mg total) by mouth 2 (two) times daily for 7 days., Disp: 14 tablet, Rfl: 0   HYDROcodone bit-homatropine (HYCODAN) 5-1.5 MG/5ML syrup, Take 5 mLs by mouth every 8 (eight) hours as needed for cough., Disp: 120 mL, Rfl: 0   predniSONE (DELTASONE) 50 MG tablet, Take 1 tab po daily x5 days, Disp: 5 tablet, Rfl: 0   amphetamine-dextroamphetamine (ADDERALL) 20 MG tablet, Take 20 mg by mouth daily., Disp: , Rfl:    buPROPion (WELLBUTRIN XL) 300 MG 24 hr tablet, Take 1 tablet (300 mg total) by mouth daily., Disp: 90 tablet, Rfl: 1   diazepam (VALIUM) 10 MG tablet, TAKE 0.5-1 TABLETS (5-10 MG TOTAL) BY MOUTH DAILY AS NEEDED  FOR ANXIETY (#30 FOR 30 DAYS, TRY TO MAKE THIS LAST LONGER TO AVOID RISK OF TOLERANCE & DEPENDENCE)., Disp: 30 tablet, Rfl: 5   meloxicam (MOBIC) 15 MG tablet, 1 tab p.o. daily with a meal as needed for pain, Disp: 90 tablet, Rfl: 3  EXAM:  VITALS per patient if applicable: Ht 5\' 5"  (1.651 m)   Wt 160 lb (72.6 kg)   LMP 04/21/2014 (Approximate)   BMI 26.63 kg/m   GENERAL: alert, oriented, appears well and in no acute distress  HEENT: atraumatic, conjunttiva clear, no obvious abnormalities on inspection of external nose and ears  NECK: normal movements of the head and neck  LUNGS: on inspection no signs of respiratory distress, breathing rate appears normal, no obvious gross SOB, gasping or wheezing  CV: no obvious cyanosis  MS: moves all visible extremities without noticeable abnormality  PSYCH/NEURO: pleasant and cooperative, no obvious depression or anxiety, speech and thought processing grossly intact  ASSESSMENT AND PLAN:  Discussed the following assessment and plan:  Acute bronchitis Start a course of doxycycline along with 5 days of prednisone 50 mg daily.  Adding Hycodan syrup as needed.  Recommend increase fluids in addition to Mucinex.  Instructed to contact clinic if symptoms are not improving or if worsening.  We also discussed taking COVID test if symptoms worsen.     I discussed the assessment and treatment plan with the patient. The patient was provided an opportunity to ask questions and all were answered. The patient agreed with the plan and demonstrated an understanding of the instructions.   The patient was advised to call back or seek an in-person evaluation if the symptoms worsen or if the condition fails to improve as anticipated.    06/21/2014, DO

## 2021-07-06 DIAGNOSIS — Z20822 Contact with and (suspected) exposure to covid-19: Secondary | ICD-10-CM | POA: Diagnosis not present

## 2021-07-10 ENCOUNTER — Ambulatory Visit (INDEPENDENT_AMBULATORY_CARE_PROVIDER_SITE_OTHER): Payer: BC Managed Care – PPO | Admitting: Professional

## 2021-07-10 DIAGNOSIS — F4310 Post-traumatic stress disorder, unspecified: Secondary | ICD-10-CM

## 2021-07-17 ENCOUNTER — Ambulatory Visit (INDEPENDENT_AMBULATORY_CARE_PROVIDER_SITE_OTHER): Payer: BC Managed Care – PPO | Admitting: Professional

## 2021-07-17 DIAGNOSIS — F4310 Post-traumatic stress disorder, unspecified: Secondary | ICD-10-CM | POA: Diagnosis not present

## 2021-07-24 ENCOUNTER — Ambulatory Visit: Payer: BC Managed Care – PPO | Admitting: Rehabilitative and Restorative Service Providers"

## 2021-07-24 ENCOUNTER — Ambulatory Visit (INDEPENDENT_AMBULATORY_CARE_PROVIDER_SITE_OTHER): Payer: BC Managed Care – PPO | Admitting: Professional

## 2021-07-24 ENCOUNTER — Encounter: Payer: Self-pay | Admitting: Psychology

## 2021-07-24 ENCOUNTER — Encounter: Payer: Self-pay | Admitting: Professional

## 2021-07-24 DIAGNOSIS — F331 Major depressive disorder, recurrent, moderate: Secondary | ICD-10-CM

## 2021-07-24 DIAGNOSIS — F4310 Post-traumatic stress disorder, unspecified: Secondary | ICD-10-CM

## 2021-07-24 DIAGNOSIS — F411 Generalized anxiety disorder: Secondary | ICD-10-CM

## 2021-07-24 NOTE — Progress Notes (Signed)
This encounter was created in error - please disregard.

## 2021-07-24 NOTE — Progress Notes (Signed)
[facility] Counselor/Therapist Progress Note  Patient ID: NELI FOFANA, MRN: [id number],    Date: 07/24/2021  Time Spent:  44 minutes  Treatment Type: Individual Therapy  Reported Symptoms: pleasant, easily engaged, no evidence of anxiety or depressed mood present  Mental Status Exam: Appearance:  Casual     Behavior: Sharing  Motor: Normal  Speech/Language:  Clear and Coherent and Normal Rate  Affect: Full Range  Mood: normal  Thought process: goal directed  Thought content:   WNL  Sensory/Perceptual disturbances:   WNL  Orientation: oriented to person, place, time/date, and situation  Attention: Good  Concentration: Good  Memory: WNL  Fund of knowledge:  Good  Insight:   Good  Judgment:  Good  Impulse Control: Good   Risk Assessment: Danger to Self:  No Self-injurious Behavior: No Danger to Others: No Duty to Warn:no Physical Aggression / Violence:No  Access to Firearms a concern: No  Gang Involvement:No   Subjective:  The patient arrived on time for her webex session appearing casually groomed and easily engaged. She continues to be sick since the 10th of November.  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  Plan: -work on showing (non-sexual) affection to Loraine Leriche -be more assertive with marketing the business -self-care  Salley Scarlet, [facility]

## 2021-07-25 DIAGNOSIS — F333 Major depressive disorder, recurrent, severe with psychotic symptoms: Secondary | ICD-10-CM | POA: Diagnosis not present

## 2021-07-26 ENCOUNTER — Ambulatory Visit (INDEPENDENT_AMBULATORY_CARE_PROVIDER_SITE_OTHER): Payer: BC Managed Care – PPO | Admitting: Rehabilitative and Restorative Service Providers"

## 2021-07-26 ENCOUNTER — Encounter: Payer: Self-pay | Admitting: Rehabilitative and Restorative Service Providers"

## 2021-07-26 ENCOUNTER — Other Ambulatory Visit: Payer: Self-pay

## 2021-07-26 DIAGNOSIS — R29898 Other symptoms and signs involving the musculoskeletal system: Secondary | ICD-10-CM | POA: Diagnosis not present

## 2021-07-26 DIAGNOSIS — M6281 Muscle weakness (generalized): Secondary | ICD-10-CM

## 2021-07-26 DIAGNOSIS — G8929 Other chronic pain: Secondary | ICD-10-CM | POA: Diagnosis not present

## 2021-07-26 DIAGNOSIS — M545 Low back pain, unspecified: Secondary | ICD-10-CM

## 2021-07-26 NOTE — Patient Instructions (Signed)
Trigger Point Dry Needling  What is Trigger Point Dry Needling (DN)? DN is a physical therapy technique used to treat muscle pain and dysfunction. Specifically, DN helps deactivate muscle trigger points (muscle knots).  A thin filiform needle is used to penetrate the skin and stimulate the underlying trigger point. The goal is for a local twitch response (LTR) to occur and for the trigger point to relax. No medication of any kind is injected during the procedure.   What Does Trigger Point Dry Needling Feel Like?  The procedure feels different for each individual patient. Some patients report that they do not actually feel the needle enter the skin and overall the process is not painful. Very mild bleeding may occur. However, many patients feel a deep cramping in the muscle in which the needle was inserted. This is the local twitch response.   How Will I feel after the treatment? Soreness is normal, and the onset of soreness may not occur for a few hours. Typically this soreness does not last longer than two days.  Bruising is uncommon, however; ice can be used to decrease any possible bruising.  In rare cases feeling tired or nauseous after the treatment is normal. In addition, your symptoms may get worse before they get better, this period will typically not last longer than 24 hours.   What Can I do After My Treatment? Increase your hydration by drinking more water for the next 24 hours. You may place ice or heat on the areas treated that have become sore, however, do not use heat on inflamed or bruised areas. Heat often brings more relief post needling. You can continue your regular activities, but vigorous activity is not recommended initially after the treatment for 24 hours. DN is best combined with other physical therapy such as strengthening, stretching, and other therapies.   Aquatic Therapy: What to Expect!  Where:  [facility] [address] [place], [place] [address] [phone]           How to Prepare:  If you require assistance with dressing, with transportation (ie: wheel chair), or toileting, a caregiver must attend the entire session with you (unless your primary therapists feels this is not necessary).   If there is thunder during your appointment, you will be asked to leave pool area. You have the option to finish your session in the physical therapy area near the gym. Masks in the pool area are optional. Your face will remain dry during your session, so you are welcome to keep your mask on, if desired. You will be spaced at least 6 feet from other aquatic patients.  Please bring your own swim towel to dry off with.   There are Men's and Women's locker rooms with showers, as well as gender neutral bathrooms in the pool area.  Please arrive IN YOUR SUIT and a few minutes prior to your appointment - this helps to avoid delays in starting your session. Head to the pool and await your appointment on the bench on the pool deck. Please make sure to attend to any toileting needs prior to entering the pool. Once on the pool deck your therapist will ask you to sign the Patient  Consent and Assignment of Benefits form. Your therapist may take your blood pressure prior to, during and after your session if indicated. We usually try and create a home exercise program based on activities we do in the pool. Some patients do not want to or do  not have the ability to participate in an aquatic home program - this is not a barrier in any way to you participating in aquatic therapy as part of your current therapy plan!  Appointments:  All sessions are 45 minutes  About the pool: Entering the pool: Your therapist will assist you if needed; there are two ways to enter the pool - stairs or a mechanical lift. Your therapist will determine the most appropriate way for you. Water temperature is usually around 91-95.  There is a lap pool with a  temperature around 84 There may be other therapists and patients in the pool at the same time.   Contact Info:            To cancel appointment, please call [facility] at [phone] If you are running late, please call [facility] at [phone]                    Access Code: [email or web address] URL: [email or web address] Date: 07/26/2021 Prepared by: Corlis Leak  Exercises Supine Transversus Abdominis Bracing with Pelvic Floor Contraction - 2 x daily - 7 x weekly - 1 sets - 10 reps - 10sec hold Hip Flexor Stretch at Edge of Bed - 2 x daily - 7 x weekly - 1 sets - 3 reps - 30 sec hold Beginner Bridge - 2 x daily - 7 x weekly - 1 sets - 10 reps - 5-10 sec hold

## 2021-07-26 NOTE — Therapy (Signed)
[facility] [address] [place], [place], [address] Phone: [phone]   Fax:  [phone]  Physical Therapy Evaluation  Patient Details  Name: BRITTANYANN WITTNER MRN: [id number] Date of Birth: 06-Sep-1965 Referring Provider (PT): Clovis Riley PA-C   Encounter Date: 07/26/2021   PT End of Session - 07/26/21 1556     Visit Number 1    Number of Visits 12    Date for PT Re-Evaluation 09/06/21    PT Start Time 1433    PT Stop Time 1518    PT Time Calculation (min) 45 min    Activity Tolerance Patient tolerated treatment well;Patient limited by pain             Past Medical History:  Diagnosis Date   ADD (attention deficit disorder)    Chronic back pain 01/30/2016   folowing with Ortho for Tramadol, we are managing benzo therapy, started TCA as well, refer to PT, refer to [facility] for [facility]BT    Depression 01/30/2016   TCA to help this as well as pain/sleep issues, refer for CBT    Insomnia 01/30/2016   TCA, do not take with benzos    Postmenopausal 01/30/2016   HRT from OBGYN     Past Surgical History:  Procedure Laterality Date   ABDOMINAL SURGERY     AUGMENTATION MAMMAPLASTY     BACK SURGERY     BREAST SURGERY     GYN surgery     TONSILLECTOMY      There were no vitals filed for this visit.    Subjective Assessment - 07/26/21 1436     Subjective Patient reports that she has had LBP for ~ 10 years. She had a spinal stimulator remover 10/22 after 5 months with no improvement in pain. She does not have any known injury. Symptoms occurred gradually with increased intensity. She had RAF x 4 levels ~ 2-3 yrs ago with no improvement. No improvement with two trials of PT 3 visits in 2017 and 7 visits in 2018 with no improvement.    Pertinent History lumabr ablasions multiple leves; spial cord stimulator inplant and reemoval; depression    Patient Stated Goals wants to be able to get rid of pain at least  50% to be able to function for ADL's    Currently in Pain? Yes    Pain Score 6     Pain Location Back    Pain Orientation Lower;Left;Right    Pain Descriptors / Indicators Aching;Stabbing    Pain Type Chronic pain    Pain Radiating Towards to Rt hip posteriorly and into the lateral hip    Pain Onset More than a month ago    Pain Frequency Intermittent    Aggravating Factors  sitting > 5 min    Pain Relieving Factors bending forward; TENS unit                [facility] PT Assessment - 07/26/21 0001       Assessment   Medical Diagnosis Low back pain; Rt posterior hip pain    Referring Provider (PT) Clovis Riley PA-C    Onset Date/Surgical Date 08/21/15   low back pain 15 years   Hand Dominance Right    Next MD Visit as needed    Prior Therapy here 2017 and 2018      Precautions   Precautions None      Restrictions   Weight Bearing Restrictions No  Balance Screen   Has the patient fallen in the past 6 months No    Has the patient had a decrease in activity level because of a fear of falling?  No    Is the patient reluctant to leave their home because of a fear of falling?  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    Leisure household chores; sedentary; walks dogw 2x/day for ~ 15 min on a trail behind house      Observation/Other Assessments   Focus on Therapeutic Outcomes (FOTO)  43      Sensation   Additional Comments WFL's per pt report      Posture/Postural Control   Posture Comments flexed forward; sits propped forward elbows on knees; standing upper body slightly  shifted to Lt      AROM   Lumbar Flexion 100%    Lumbar Extension 70%    Lumbar - Right Side Bend 75% sacrum to Lt posterior shoulder girdle    Lumbar - Left Side Bend 80% sharp pain in sacrum and to Rt posterior hip    Lumbar - Right Rotation 30% tight    Lumbar - Left Rotation 40% tight      Strength   Right Hip Flexion 4/5    Right Hip Extension 4+/5    Right Hip ABduction 4/5    Left Hip Flexion 4/5    Left Hip Extension 4+/5    Left Hip ABduction 4+/5      Flexibility   Hamstrings WFL's    Quadriceps WFL's    ITB tight Rt > Lt    Piriformis right Rt      Palpation   Spinal mobility hypomobile lumbar spine    SI assessment  painful with spring testing each side of sacrum    Palpation comment muscular tightness Rt > Lt hip flexors; Rt posterior hip through the gluts/piriformis      Ambulation/Gait   Gait Comments flexed forward posture and alignment      Balance   Balance Assessed --   SLS Lt LE x 10 sec; * sec on Rt no UE support                       Objective measurements completed on examination: See above findings.       [facility] Adult PT Treatment/Exercise - 07/26/21 0001       Lumbar Exercises: Stretches   Hip Flexor Stretch Right;Left;1 rep;30 seconds    Hip Flexor Stretch Limitations modified thomas position    Press Ups Limitations increased LBP    Piriformis Stretch Limitations trial of travell stretch increased hip flexor pain    Figure 4 Stretch Limitations patient does this stretch at home      Lumbar Exercises: Supine   AB Set Limitations 3 part core 10 sec x 10 reps    Bridge 5 reps;5 seconds                     PT Education - 07/26/21 1518  Education Details HEP POC DN aquatic therapy    Person(s) Educated Patient    Methods Explanation;Demonstration;Tactile cues;Verbal cues;Handout    Comprehension Verbalized understanding;Returned demonstration;Verbal cues required;Tactile cues required                  PT Long Term Goals - 07/26/21 1705       PT LONG TERM GOAL #1   Title Decrease pain in LB and Rt hip by 25-50% allowing patient to progress with exercise and core stabilization to improve functional activity tolerance    Time 6    Period Weeks    Status New    Target Date 09/06/21      PT LONG TERM GOAL #2   Title Improve LE strength to 4+/5 to 5/5    Time 6    Period Weeks    Status New    Target Date 09/06/21      PT LONG TERM GOAL #3   Title Improve core strength progressing with functional strengthening program and increasing sitting tolerance to >15 min    Time 6    Period Weeks    Status New    Target Date 09/06/21      PT LONG TERM GOAL #4   Title Independent in HEP including aquatic therapy program as indicated    Time 6    Period Weeks    Status New    Target Date 09/06/21      PT LONG TERM GOAL #5   Title Improve functional limitation score to 58    Time 6    Period Weeks    Status New    Target Date 09/06/21                    Plan - 07/26/21 1658     Clinical Impression Statement Patient presents with 20+ year history of LBP with Rt posterior hip pain. She has been seen for PT in 2017 x 3 visits and 2019 x 7 visits; received multilevel nerve ablation without improvement; and had nerve stimulator implanted Rt lumbar x 5 month trial (removed 10/22) which increased pain. Patient has poor posture and alignment; limited trunk and LE mobility/ROM; decreased core and bilat LE strength; antalgic gait; decreased functional activity level; pain on a daily basis limiting functional activities and ADL's. Patient will benefit from PT to address problems identified.    Stability/Clinical Decision Making Stable/Uncomplicated    Clinical Decision Making Low    Rehab Potential Fair    PT Frequency 2x / week    PT Duration 6 weeks    PT Treatment/Interventions ADLs/Self Care Home Management;Aquatic Therapy;Cryotherapy;Electrical  Stimulation;Iontophoresis 4mg /ml Dexamethasone;Moist Heat;Ultrasound;Therapeutic activities;Therapeutic exercise;Neuromuscular re-education;Patient/family education;Manual techniques;Dry needling;Taping    PT Next Visit Plan review and progress with HEP; further assessment of SI; trial of DN and/or manual work; modalities as indicated(has TENS unit at home which does help some); back care education; core strengthening and stabilization    PT Home Exercise Plan [id number]    Consulted and Agree with Plan of Care Patient             Patient will benefit from skilled therapeutic intervention in order to improve the following deficits and impairments:  Decreased range of motion, Decreased activity tolerance, Pain, Hypomobility, Impaired flexibility, Improper body mechanics, Other (comment), Decreased mobility, Decreased strength, Postural dysfunction  Visit Diagnosis: Chronic bilateral low back pain without sciatica  Muscle weakness (generalized)  Other symptoms and signs involving the musculoskeletal system  Problem List Patient Active Problem List   Diagnosis Date Noted   Acute bronchitis 07/05/2021   Heberden's nodes 03/02/2020   Right elbow pain 12/31/2019   Familial hypercholesterolemia 01/16/2019   Heartburn 01/16/2019   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   Spondylosis without myelopathy or radiculopathy, lumbar region 05/10/2016   Chronic midline low back pain without sciatica 04/11/2016   Sinus bradycardia, chronic 04/04/2016   Lumbar spinal stenosis 03/29/2016   H/O mammogram 02/14/2016   History of bradycardia 02/14/2016   Chronic back pain 01/30/2016   Depression 01/30/2016   Cephalalgia 01/30/2016   Other fatigue 01/30/2016   Postmenopausal 01/30/2016   Annual physical exam 01/05/2015    Akif Weldy Rober Minion, PT, MPH  07/26/2021, 5:13 PM  [facility] [address] [place], [place], [address] Phone:  [phone]   Fax:  [phone]  Name: JANSEN GOODPASTURE MRN: [id number] Date of Birth: 04/27/1966

## 2021-07-26 NOTE — Progress Notes (Signed)
This session was held via video teletherapy due to the coronavirus risk at this time. The patient consented to video teletherapy and was located at her home during this session. She is aware it is the responsibility of the patient to secure confidentiality on their end of the session. The provider was in a private home office for the duration of this session.

## 2021-07-27 ENCOUNTER — Telehealth: Payer: BC Managed Care – PPO | Admitting: Physician Assistant

## 2021-07-27 DIAGNOSIS — H109 Unspecified conjunctivitis: Secondary | ICD-10-CM | POA: Diagnosis not present

## 2021-07-27 MED ORDER — POLYMYXIN B-TRIMETHOPRIM 10000-0.1 UNIT/ML-% OP SOLN: 1.0000 [drp] | OPHTHALMIC | 0 refills | Status: DC

## 2021-07-27 NOTE — Progress Notes (Signed)
E-Visit for [employer]   We are sorry that you are not feeling well.  Here is how we plan to help!  Based on what you have shared with me it looks like you have conjunctivitis.  Conjunctivitis is a common inflammatory or infectious condition of the eye that is often referred to as "pink eye".  In most cases it is contagious (viral or bacterial). However, not all conjunctivitis requires antibiotics (ex. Allergic).  We have made appropriate suggestions for you based upon your presentation.  I have prescribed Polytrim Ophthalmic drops 1-2 drops 4 times a day times 5 days  Pink eye can be highly contagious.  It is typically spread through direct contact with secretions, or contaminated objects or surfaces that one may have touched.  Strict handwashing is suggested with soap and water is urged.  If not available, use alcohol based had sanitizer.  Avoid unnecessary touching of the eye.  If you wear contact lenses, you will need to refrain from wearing them until you see no white discharge from the eye for at least 24 hours after being on medication.  You should see symptom improvement in 1-2 days after starting the medication regimen.  Call your PCP or your eye doctor if symptoms are not improved in 1-2 days.  If you are having eye pain with movement of the eye, this could be a more serious infection and should be seen by your eye doctor ASAP.  Home Care: Wash your hands often! Do not wear your contacts until you complete your treatment plan. Avoid sharing towels, bed linen, personal items with a person who has pink eye. See attention for anyone in your home with similar symptoms.  Get Help Right Away If: Your symptoms do not improve. You develop blurred or loss of vision. Your symptoms worsen (increased discharge, pain or redness)   Thank you for choosing an e-visit.  Your e-visit answers were reviewed by a board certified advanced clinical practitioner to complete your personal care plan.  Depending upon the condition, your plan could have included both over the counter or prescription medications.  Please review your pharmacy choice. Make sure the pharmacy is open so you can pick up prescription now. If there is a problem, you may contact your provider through [employer] and have the prescription routed to another pharmacy.  Your safety is important to [place]. If you have drug allergies check your prescription carefully.   For the next 24 hours you can use MyChart to ask questions about today's visit, request a non-urgent call back, or ask for a work or school excuse. You will get an email in the next two days asking about your experience. I hope that your e-visit has been valuable and will speed your recovery.   I provided 5 minutes of non face-to-face time during this encounter for chart review and documentation.

## 2021-07-31 ENCOUNTER — Encounter: Payer: Self-pay | Admitting: Family Medicine

## 2021-07-31 MED ORDER — DIAZEPAM 10 MG PO TABS: 5.0000 mg | ORAL_TABLET | Freq: Every day | ORAL | 0 refills | Status: DC | PRN

## 2021-08-01 NOTE — Therapy (Addendum)
D/C from PT records

## 2021-08-02 ENCOUNTER — Ambulatory Visit (INDEPENDENT_AMBULATORY_CARE_PROVIDER_SITE_OTHER): Payer: BC Managed Care – PPO | Admitting: Professional

## 2021-08-02 ENCOUNTER — Encounter: Payer: Self-pay | Admitting: Professional

## 2021-08-02 ENCOUNTER — Encounter: Payer: BC Managed Care – PPO | Admitting: Physical Therapy

## 2021-08-02 DIAGNOSIS — F331 Major depressive disorder, recurrent, moderate: Secondary | ICD-10-CM

## 2021-08-02 DIAGNOSIS — F4310 Post-traumatic stress disorder, unspecified: Secondary | ICD-10-CM | POA: Diagnosis not present

## 2021-08-02 NOTE — Progress Notes (Signed)
[facility] Counselor/Therapist Progress Note  Patient ID: Renee Norris, MRN: [id number],    Date: 08/02/2021  Time Spent: 53 minutes 8-853 am  Treatment Type: Individual Therapy  Reported Symptoms: concerned, depressed, tearful  Mental Status Exam: Appearance:  Neat     Behavior: Sharing  Motor: Normal  Speech/Language:  Normal Rate  Affect: Full Range  Mood: depressed  Thought process: normal  Thought content:   WNL  Sensory/Perceptual disturbances:   WNL  Orientation: oriented to person, place, time/date, and situation  Attention: Good  Concentration: Good  Memory: WNL  Fund of knowledge:  Good  Insight:   Good  Judgment:  Good  Impulse Control: Good   Risk Assessment: Danger to Self:  No Self-injurious Behavior: No Danger to Others: No Duty to Warn:no Physical Aggression / Violence:No  Access to Firearms a concern: No  Gang Involvement:No   Subjective: This session was held via video teletherapy due to the coronavirus risk at this time. The patient consented to video teletherapy and was located at her home during this session. She is aware it is the responsibility of the patient to secure confidentiality on her end of the session. The provider was in a private home office for the duration of this session.   The patient arrived on time for her webex appointment appearing nicely groomed and easily engaged.     Issues addressed: 1-marital -pt concerned that she and spouse Loraine Leriche are not on the same page -he is condescending toward the pt -he threatens to leave -he takes over conversations when she is talking with a friend -she is not interested in sex despite describing as closeness, satisfaction   -pt admits that she is tired and has been sick a lot   -pt sees as work     -pt reports there are things to do around the house -pt feels like taking money out of her 18 [address] and putting in bank account   -she does not see herself being here in ten  years 2-she is so angry at people -feels lonely -"people don't understand depression, how I feel" -she reports her psychiatrist increased her meds from 200 mg to 300 mg   -pt has not increased and is drinking wine instead -pt does not feel she is fulfilling her purpose   -her purpose is "to make money"   -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  Plan:  -get feelings out regarding how you are feeling about your spouse -meet two times per week for the next several weeks to assist pt in getting through crisis -next appointment will be Friday, August 04, 2021 at 8 am  Teofilo Pod, [facility]

## 2021-08-04 ENCOUNTER — Ambulatory Visit (INDEPENDENT_AMBULATORY_CARE_PROVIDER_SITE_OTHER): Payer: BC Managed Care – PPO | Admitting: Professional

## 2021-08-04 ENCOUNTER — Encounter: Payer: BC Managed Care – PPO | Admitting: Rehabilitative and Restorative Service Providers"

## 2021-08-04 ENCOUNTER — Encounter: Payer: Self-pay | Admitting: Professional

## 2021-08-04 DIAGNOSIS — F331 Major depressive disorder, recurrent, moderate: Secondary | ICD-10-CM

## 2021-08-04 DIAGNOSIS — F4310 Post-traumatic stress disorder, unspecified: Secondary | ICD-10-CM | POA: Diagnosis not present

## 2021-08-04 DIAGNOSIS — F411 Generalized anxiety disorder: Secondary | ICD-10-CM

## 2021-08-04 NOTE — Progress Notes (Signed)
[facility] Counselor/Therapist Progress Note  Patient ID: Renee Norris, MRN: [id number],    Date: 08/04/2021  Time Spent: 56 minutes 1002-1058 minutes  Treatment Type: Individual Therapy  Reported Symptoms: feeling less depressed  Mental Status Exam: Appearance:  Fairly Groomed     Behavior: Appropriate and Sharing  Motor: Normal  Speech/Language:  Clear and Coherent and Normal Rate  Affect: Full Range  Mood: normal  Thought process: goal directed  Thought content:   WNL  Sensory/Perceptual disturbances:   WNL  Orientation: oriented to person, place, time/date, and situation  Attention: Good  Concentration: Good  Memory: WNL  Fund of knowledge:  Good  Insight:   Good  Judgment:  Good  Impulse Control: Good   Risk Assessment: Danger to Self:  No Self-injurious Behavior: No Danger to Others: No Duty to Warn:no Physical Aggression / Violence:No  Access to Firearms a concern: No  Gang Involvement:No   Subjective: This session was held via video teletherapy due to the coronavirus risk at this time. The patient consented to video teletherapy and was located at her home during this session. She is aware it is the responsibility of the patient to secure confidentiality on her end of the session. The provider was in a private home office for the duration of this session.   The patient arrived on time for her webex appointment appearing nicely groomed and easily engaged  Issues addressed: 1-mood -less depressed, more hopeful -pt admits taking medication as prescribed   -reports during the holidays she needs to take the higher dose     -Adderall and Wellbutrin 2-marital -begin meeting with her husband and talking daily 3-self-care -praying has been helpful -spent some time with her friend Boneta Lucks 4-finances -major concern -husband suggesting she sell her 2020 [employer] that she paid cash for   -pt reports for her that would be a last resort   -pt has  good spending habits and her husband does not   -pt feels resentful toward her spouse for asking her to sell her car -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   -she was told she would receive a call within an hour and they did not call   -they emailed her later to tell her they had hired another candidate 5-professional -pt feels stuck -she is used to rejection -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 -discussed preferred environment   -pt would not work with husband   -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  Teofilo Pod, [facility]

## 2021-08-07 ENCOUNTER — Encounter: Payer: Self-pay | Admitting: Professional

## 2021-08-07 ENCOUNTER — Ambulatory Visit (INDEPENDENT_AMBULATORY_CARE_PROVIDER_SITE_OTHER): Payer: BC Managed Care – PPO | Admitting: Professional

## 2021-08-07 DIAGNOSIS — F331 Major depressive disorder, recurrent, moderate: Secondary | ICD-10-CM

## 2021-08-07 DIAGNOSIS — F4310 Post-traumatic stress disorder, unspecified: Secondary | ICD-10-CM

## 2021-08-07 NOTE — Progress Notes (Addendum)
[facility] Counselor/Therapist Progress Note  Patient ID: Renee Norris, MRN: [id number],    Date: 08/07/2021  Time Spent: 48 minutes 803-851 am  Treatment Type: Individual Therapy  Reported Symptoms: bright, calm  Mental Status Exam: Appearance:  Neat     Behavior: Appropriate and Sharing  Motor: Normal  Speech/Language:  Clear and Coherent and Normal Rate  Affect: Full Range  Mood: normal  Thought process: goal directed  Thought content:   WNL  Sensory/Perceptual disturbances:   WNL  Orientation: oriented to person, place, time/date, and situation  Attention: Good  Concentration: Good  Memory: WNL  Fund of knowledge:  Good  Insight:   Good  Judgment:  Good  Impulse Control: Good   Risk Assessment: Danger to Self:  No Self-injurious Behavior: No Danger to Others: No Duty to Warn:no Physical Aggression / Violence:No  Access to Firearms a concern: No  Gang Involvement:No   Subjective: This session was held via video teletherapy due to the coronavirus risk at this time. The patient consented to video teletherapy and was located at her home during this session. She is aware it is the responsibility of the patient to secure confidentiality on her end of the session. The provider was in a private home office for the duration of this session.   The patient arrived on time for her webex appointment appearing nicely groomed and easily engaged  Issues addressed: 1-mood -feels better -"I think I'm tire of being tired" -"I've made an effort to communicate more with Loraine Leriche" -went to a Christmas party on Saturday and she didn't worry about how she looked or if she decided to be quiet   -two couples with whom they are friends   -she can relax and not meet any standards   -her friend group gets together about one time per month     -pt has been invited recently and opted out       -unsure why she has       -she overwhelms the patient due to the amount of talking  she does -friend Boneta Lucks   -their relationship is reciprocal   -"she's a quiet soul"   -they have a lot in common 2-positives -she wrapped Christmas presents and that felt good because it makes her happy -she enjoyed singing Have Yourself a Merry Little Christmas and it makes her think of her mom when she was young telling her she has a beautiful voice 3-relationship with brother Fayrene Fearing Montez Hageman) -brother did not respond in his normal time   -sick and that makes her nervous   -pt admits that when they were kids she was mean to him     -pt unsure how to approach     -would want to be just her and him     -she loves that he notices her feelings -she notices that she longs for contact with him around the holidays -relationship with sister-in-law (SIL) is loving but distant   -she is introverted as is the patient   -she has her own issues that have impacted the relationship   -pt invests in relationship with SIL     -she was giving free massages     -pt reports she feels inferior to her       -"maybe because it's because I feel inferior to her" 3-professional -she received an apology from the man that did   Problems Addressed  Childhood Trauma, Chronic Pain, Intimate Relationship Conflicts, Social AnxietyGoals 1. Acquire and utilize the  necessary pain management skills. 2. Develop an awareness of how childhood issues have affected and continue to affect one's family life. 3. Develop the essential social skills that will enhance the quality of relationship life. 4. Develop the necessary skills for effective, open communication, mutually satisfying sexual intimacy, and enjoyable time for companionship within the relationship. 5. Find an escape route from the pain. 6. Find relief from pain and build renewed contentment and joy in performing activities of everyday life. 7. Increase awareness of own role in the relationship conflicts. Objective Commit to the establishment of healthy, mutually  satisfying sexual attitudes and behavior that is not a reflection of destructive earlier experiences. Target Date: 2022-06-04 Frequency: Weekly  Progress: 0 Modality: individual  Objective Participate in an evaluation to identify or rule out sexual dysfunction and participate in appropriate treatment, if indicated. Target Date: 2022-06-04 Frequency: Weekly  Progress: 0 Modality: individual  Related Interventions Refer the client to a specialist for a diagnostic evaluation of sexual dysfunction (e.g., rule-out of medical or substance etiology), with recommendation for appropriate evidence-based treatment (e.g., medication, sex therapy, surgery). Objective Increase time spent in enjoyable contact with the partner. Target Date: 2022-06-04 Frequency: Weekly  Progress: 0 Modality: individual  Related Interventions Assist the couple in identifying and planning rewarding social/recreational activities that can be shared with the partner (or assign "Identify and Schedule Pleasant Activities" in the Adult Psychotherapy Homework Planner by Stephannie Li). Objective Implement a "time out" signal that either partner may give to stop interaction that may escalate into abuse. Target Date: 2022-06-04 Frequency: Weekly  Progress: 0 Modality: individual  Related Interventions Solicit a firm agreement from both partners that the "time out" signal will be responded to favorably without debate. Assist the partners in identifying a clear verbal or behavioral signal to be used by either partner to terminate interaction immediately if either fears impending abuse. Objective Increase the frequency of the direct expression of honest, respectful, and positive feelings and thoughts within the relationship. Target Date: 2022-06-04 Frequency: Weekly  Progress: 0 Modality: individual  Related Interventions Use behavioral techniques (education, modeling, role-playing, corrective feedback, and positive reinforcement) to teach  communication skills including assertive communication, offering positive feedback, active listening, making positive requests of others for behavior change, and giving negative feedback in an honest and respectful manner. Assist the couple in identifying conflicts that can be addressed using communication, conflict-resolution, and/or problem-solving skills (see "Behavioral Marital Therapy" by Loyce Dys). 8. Let go of blame and begin to forgive others for pain caused in childhood. Objective Describe what it was like to grow up in the home environment. Target Date: 2022-06-04 Frequency: Weekly  Progress: 0 Modality: individual  Related Interventions Actively build the level of trust with the client in individual sessions through consistent eye contact, active listening, unconditional positive regard, and warm acceptance to help increase his/her ability to identify and express feelings. Develop the client's family genogram and/or symptom line and help identify patterns of dysfunction within the family. Objective State the role substance abuse has in dealing with emotional pain of childhood. Target Date: 2022-06-04 Frequency: Weekly  Progress: 0 Modality: individual  Related Interventions Assess the client's substance abuse behavior that has developed, in part, as a means of coping with feelings of childhood trauma. If alcohol or drug abuse is found to be a problem, encourage treatment focused on this issue (see the Substance Use chapter in this Planner). Objective Decrease feelings of shame by being able to verbally affirm self as not responsible for abuse.  Target Date: 2022-06-04 Frequency: Weekly  Progress: 0 Modality: individual  Related Interventions Consistently reiterate that responsibility for the abuse falls on the abusive adults, not the surviving child (for deserving the abuse), and reinforce statements that accurately reflect placing blame on perpetrators and on  nonprotective, nonnurturant adults. Assign writing a letter to mother, father, or other abuser in which the client expresses his/her feelings regarding the abuse. Guide the client in an empty chair exercise with a key figure connected to the abuse (i.e., perpetrator, sibling, or parent); reinforce the client for placing responsibility for the abuse or neglect on the caretaker. Objective Identify the positive aspects for self of being able to forgive all those involved with the abuse. Target Date: 2022-06-04 Frequency: Weekly  Progress: 0 Modality: individual  Related Interventions Assign the client to write a forgiveness letter to the perpetrator of abuse (or assign "Feelings and Forgiveness Letter" in the Adult Psychotherapy Homework Planner by [facility]); process the letter. Teach the client the benefits (i.e., release of hurt and anger, putting issue in the past, opens door for trust of others, etc.) of beginning a process of forgiveness of (not necessarily forgetting or fraternizing with) abusive adults. Objective Decrease statements of being a victim while increasing statements that reflect personal empowerment. Target Date: 2022-06-04 Frequency: Weekly  Progress: 0 Modality: individual  Related Interventions Ask the client to complete an exercise that identifies the positives and negatives of being a victim and the positives and negatives of being a survivor; compare and process the lists. Encourage and reinforce the client's statements that reflect movement away from viewing self as a victim and toward personal empowerment as a survivor (or assign "Changing from Victim to Survivor" in the Adult Psychotherapy Homework Planner by Stephannie Li). Objective Increase level of trust of others as shown by more socialization and greater intimacy tolerance. Target Date: 2022-06-04 Frequency: Weekly  Progress: 0 Modality: individual  Related Interventions Teach the client the share-check method of  building trust in relationships (sharing a little information and checking as to the recipient's sensitivity in reacting to that information). Teach the client the advantages of treating people as trustworthy given a reasonable amount of time to assess their character. 9. Participate in social performance requirements without undue fear or anxiety. 10. Reach a personal balance between solitary time and interpersonal interaction with others. Objective Describe the history and nature of social fears and avoidance. Target Date: 2022-06-04 Frequency: Weekly  Progress: 0 Modality: individual  Related Interventions Assess the client's history of social anxiety and avoidance including frequency, intensity, and duration of anxiety symptoms, triggers, and the nature and extent of avoidance (e.g., The Anxiety Disorders Interview Schedule - Adult Version). Establish rapport with the client toward building a therapeutic alliance. Objective Verbalize an understanding of the rationale for cognitive-behavioral treatment of social anxiety. Target Date: 2022-06-04 Frequency: Weekly  Progress: 0 Modality: individual  Related Interventions Discuss how therapy based on cognitive-behavioral principles targets fear and avoidance to desensitize learned fear, build social skills, reality-test anxious thoughts, and increase confidence and social effectiveness. Objective Participate in gradual repeated exposure to feared social situations within and outside of therapy. Target Date: 2022-06-04 Frequency: Weekly  Progress: 0 Modality: individual  Related Interventions Direct and assist the client in construction of a hierarchy of anxiety-producing situations associated with the phobic response. Assign the client a homework exercise in which he/she does an exposure exercise and records responses (or assign "Gradually Reducing Your Phobic Fear" in the Adult Psychotherapy Homework Planner by [facility]; also see  The Shyness  and Social Anxiety Workbook by [employer]; review and reinforce success, providing corrective feedback toward improvement. Select initial in vivo or role-played exposures that have a high likelihood of being a successful experience for the client; do cognitive restructuring within and after the exposure, use behavioral strategies (e.g., modeling, rehearsal, social reinforcement) to facilitate progress through the hierarchy (see Cognitive-Behavioral Group Therapy for Social Phobia by Rosana Hoes; Managing Social Anxiety by [place], Laurena Bering, and [place]). Objective Learn and implement social skills to reduce anxiety and build confidence in social interactions. Target Date: 2022-06-04 Frequency: Weekly  Progress: 0 Modality: individual  Related Interventions Use instruction, modeling, and role-playing to build the client's general social and/or communication skills (Cognitive Behavioral Group Therapy for Social Phobia by Rosana Hoes; Managing Social Anxiety by [place], Laurena Bering, and [place]). Objective Identify important people in life, past and present, and describe the quality, good and poor, of those relationships. Target Date: 2022-06-04 Frequency: Weekly  Progress: 0 Modality: individual  Related Interventions Conduct Interpersonal Therapy (apply Comprehensive Guide to Interpersonal Psychotherapy by Dionicia Abler, and Edward Qualia) beginning with the assessment of the client's "interpersonal inventory" of important past and present relationships; develop a case formulation linking social anxiety grief, interpersonal role disputes, role transitions, and/or interpersonal deficits). Objective Identify, challenge, and replace biased, fearful self-talk with reality-based, positive self-talk. Target Date: 2022-06-04 Frequency: Weekly  Progress: 0 Modality: individual  Objective Learn and implement calming and coping strategies to manage anxiety symptoms during moments of social anxiety  and lead to a more relaxed state in general. Target Date: 2022-06-04 Frequency: Weekly  Progress: 0 Modality: individual  Related Interventions Teach and ask the client to practice relaxation and attentional focusing skills (e.g., staying focused externally and on behavioral goals, muscular relaxation, evenly paced diaphragmatic breathing, ride the wave of anxiety) for managing social anxiety symptoms and maintaining a more relaxed approach to life; review, reinforce successes; provide corrective feedback toward effective use. 11. Regulate pain in order to maximize daily functioning and return to productive employment. 12. Release the emotions associated with past childhood/family issues, resulting in less resentment and more serenity. 13. Resolve past childhood/family issues, leading to less anger and depression, greater self-esteem, security, and confidence.  Interventions: Cognitive Behavioral Therapy, Assertiveness/Communication, and Solution-Oriented/Positive Psychology  Diagnosis:Major depressive disorder, recurrent episode, moderate (HCC)  PTSD (post-traumatic stress disorder)  Plan:  -meet on Monday, August 28, 2021 at 8am  [place], [facility]

## 2021-08-08 DIAGNOSIS — H16223 Keratoconjunctivitis sicca, not specified as Sjogren's, bilateral: Secondary | ICD-10-CM | POA: Diagnosis not present

## 2021-08-09 ENCOUNTER — Encounter: Payer: BC Managed Care – PPO | Admitting: Rehabilitative and Restorative Service Providers"

## 2021-08-11 ENCOUNTER — Encounter: Payer: BC Managed Care – PPO | Admitting: Rehabilitative and Restorative Service Providers"

## 2021-08-15 ENCOUNTER — Encounter: Payer: BC Managed Care – PPO | Admitting: Physical Therapy

## 2021-08-18 ENCOUNTER — Encounter: Payer: BC Managed Care – PPO | Admitting: Physical Therapy

## 2021-08-25 ENCOUNTER — Telehealth: Payer: Self-pay

## 2021-08-25 NOTE — Telephone Encounter (Signed)
Renee Norris pharmacy requesting med refills for sertraline 50 mg. Rx not listed in active med list.

## 2021-08-27 ENCOUNTER — Other Ambulatory Visit: Payer: Self-pay | Admitting: Family Medicine

## 2021-08-27 MED ORDER — SERTRALINE HCL 50 MG PO TABS: 50.0000 mg | ORAL_TABLET | Freq: Every day | ORAL | 3 refills | Status: DC

## 2021-08-28 ENCOUNTER — Encounter: Payer: Self-pay | Admitting: Professional

## 2021-08-28 ENCOUNTER — Ambulatory Visit (INDEPENDENT_AMBULATORY_CARE_PROVIDER_SITE_OTHER): Payer: BC Managed Care – PPO | Admitting: Professional

## 2021-08-28 DIAGNOSIS — F411 Generalized anxiety disorder: Secondary | ICD-10-CM

## 2021-08-28 DIAGNOSIS — F331 Major depressive disorder, recurrent, moderate: Secondary | ICD-10-CM

## 2021-08-28 DIAGNOSIS — F4310 Post-traumatic stress disorder, unspecified: Secondary | ICD-10-CM
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
## 2021-09-04 ENCOUNTER — Ambulatory Visit: Payer: BC Managed Care – PPO | Admitting: Professional

## 2021-09-05 ENCOUNTER — Ambulatory Visit: Payer: BC Managed Care – PPO | Admitting: Medical-Surgical

## 2021-09-07 ENCOUNTER — Other Ambulatory Visit: Payer: Self-pay | Admitting: Osteopathic Medicine

## 2021-09-11 ENCOUNTER — Ambulatory Visit (INDEPENDENT_AMBULATORY_CARE_PROVIDER_SITE_OTHER): Payer: BC Managed Care – PPO | Admitting: Professional

## 2021-09-11 ENCOUNTER — Encounter: Payer: Self-pay | Admitting: Professional

## 2021-09-11 DIAGNOSIS — F411 Generalized anxiety disorder: Secondary | ICD-10-CM

## 2021-09-11 DIAGNOSIS — F900 Attention-deficit hyperactivity disorder, predominantly inattentive type: Secondary | ICD-10-CM

## 2021-09-11 DIAGNOSIS — F4310 Post-traumatic stress disorder, unspecified: Secondary | ICD-10-CM | POA: Diagnosis not present

## 2021-09-11 DIAGNOSIS — F331 Major depressive disorder, recurrent, moderate: Secondary | ICD-10-CM

## 2021-09-11 NOTE — Progress Notes (Signed)
[facility] Counselor/Therapist Progress Note  Patient ID: Renee Norris, MRN: [id number],    Date: 09/11/2021  Time Spent: 42 minutes [phone]  Treatment Type: Individual Therapy  Reported Symptoms: anxious  Mental Status Exam: Appearance:  Casual      Behavior: Sharing  Motor: Normal  Speech/Language:  Clear and Coherent and Normal Rate  Affect: Full Range  Mood: normal  Thought process: goal directed  Thought content:   WNL  Sensory/Perceptual disturbances:   WNL  Orientation: oriented to person, place, time/date, and situation  Attention: Good  Concentration: Good  Memory: WNL  Fund of knowledge:  Good  Insight:   Good  Judgment:  Good  Impulse Control: Good   Risk Assessment: Danger to Self:  No Self-injurious Behavior: No Danger to Others: No Duty to Warn:no Physical Aggression / Violence:No  Access to Firearms a concern: No  Gang Involvement:No   Subjective: This session was held via video teletherapy due to the coronavirus risk at this time. The patient consented to video teletherapy and was located at her home during this session. She is aware it is the responsibility of the patient to secure confidentiality on her end of the session. The provider was in a private home office for the duration of this session.   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   -she admits doing so personally 3-personal -admits it's hard for her to trust people -a casual friend asked patient to be an accountability partner   -patient did not respond and later texted that she does not want to -patient admits some jealousy  -patient admits she has made progress with herself but not in  relationships with others -patient states she is selfish   -challenged   -selfish sexually toward he spouse     -he wants to have sex every Sunday and once per week because it will help his prostate   -communication is not very good     -patient identified "everything" as the communication issue -patient admits to never having any discussions as a child related to sex and that her parents were not demonstrative   -mother did not begin talking to patient about sex until after she was married and in her 8230's   -patient admitted to being promiscuous and using drugs and alcohol in order to do so     -admits that even when with her spouse she needs something to help her perform -Clinician suggested gaining some understanding of her trauma history would help   -pt has not disclosed hx  Problems Addressed: Childhood Trauma, Chronic Pain, Intimate Relationship Conflicts, Social Anxiety Goals Acquire and utilize the necessary pain management skills. 2. Develop an awareness of how childhood issues have affected and continue to affect one's family life. 3. Develop the essential social skills that will enhance the quality of relationship life. 4. Develop the necessary skills for effective, open communication, mutually satisfying sexual intimacy, and enjoyable time for companionship within the relationship. 5. Find an escape route from the pain. 6. Find relief from pain and build renewed contentment and joy in performing activities of everyday life. 7. Increase awareness of own role in the relationship conflicts. Objective Commit to the establishment of healthy, mutually satisfying sexual attitudes and behavior that  is not a reflection of destructive earlier experiences. Target Date: 2022-06-04 Frequency: Weekly  Progress: 0 Modality: individual  Objective Participate in an evaluation to identify or rule out sexual dysfunction and participate in appropriate treatment, if indicated. Target Date:  2022-06-04 Frequency: Weekly  Progress: 0 Modality: individual  Related Interventions Refer the client to a specialist for a diagnostic evaluation of sexual dysfunction (e.g., rule-out of medical or substance etiology), with recommendation for appropriate evidence-based treatment (e.g., medication, sex therapy, surgery). Objective Increase time spent in enjoyable contact with the partner. Target Date: 2022-06-04 Frequency: Weekly  Progress: 0 Modality: individual  Related Interventions Assist the couple in identifying and planning rewarding social/recreational activities that can be shared with the partner (or assign "Identify and Schedule Pleasant Activities" in the Adult Psychotherapy Homework Planner by Stephannie Li). Objective Implement a "time out" signal that either partner may give to stop interaction that may escalate into abuse. Target Date: 2022-06-04 Frequency: Weekly  Progress: 0 Modality: individual  Related Interventions Solicit a firm agreement from both partners that the "time out" signal will be responded to favorably without debate. Assist the partners in identifying a clear verbal or behavioral signal to be used by either partner to terminate interaction immediately if either fears impending abuse. Objective Increase the frequency of the direct expression of honest, respectful, and positive feelings and thoughts within the relationship. Target Date: 2022-06-04 Frequency: Weekly  Progress: 0 Modality: individual  Related Interventions Use behavioral techniques (education, modeling, role-playing, corrective feedback, and positive reinforcement) to teach communication skills including assertive communication, offering positive feedback, active listening, making positive requests of others for behavior change, and giving negative feedback in an honest and respectful manner. Assist the couple in identifying conflicts that can be addressed using communication, conflict-resolution,  and/or problem-solving skills (see "Behavioral Marital Therapy" by Loyce Dys). 8. Let go of blame and begin to forgive others for pain caused in childhood. Objective Describe what it was like to grow up in the home environment. Target Date: 2022-06-04 Frequency: Weekly  Progress: 0 Modality: individual  Related Interventions Actively build the level of trust with the client in individual sessions through consistent eye contact, active listening, unconditional positive regard, and warm acceptance to help increase his/her ability to identify and express feelings. Develop the client's family genogram and/or symptom line and help identify patterns of dysfunction within the family. Objective State the role substance abuse has in dealing with emotional pain of childhood. Target Date: 2022-06-04 Frequency: Weekly  Progress: 0 Modality: individual  Related Interventions Assess the client's substance abuse behavior that has developed, in part, as a means of coping with feelings of childhood trauma. If alcohol or drug abuse is found to be a problem, encourage treatment focused on this issue (see the Substance Use chapter in this Planner). Objective Decrease feelings of shame by being able to verbally affirm self as not responsible for abuse. Target Date: 2022-06-04 Frequency: Weekly  Progress: 0 Modality: individual  Related Interventions Consistently reiterate that responsibility for the abuse falls on the abusive adults, not the surviving child (for deserving the abuse), and reinforce statements that accurately reflect placing blame on perpetrators and on nonprotective, nonnurturant adults. Assign writing a letter to mother, father, or other abuser in which the client expresses his/her feelings regarding the abuse. Guide the client in an empty chair exercise with a key figure connected to the abuse (i.e., perpetrator, sibling, or parent); reinforce the client for placing  responsibility for the abuse or neglect on the caretaker. Objective  Identify the positive aspects for self of being able to forgive all those involved with the abuse. Target Date: 2022-06-04 Frequency: Weekly  Progress: 0 Modality: individual  Related Interventions Assign the client to write a forgiveness letter to the perpetrator of abuse (or assign "Feelings and Forgiveness Letter" in the Adult Psychotherapy Homework Planner by [employer]); process the letter. Teach the client the benefits (i.e., release of hurt and anger, putting issue in the past, opens door for trust of others, etc.) of beginning a process of forgiveness of (not necessarily forgetting or fraternizing with) abusive adults. Objective Decrease statements of being a victim while increasing statements that reflect personal empowerment. Target Date: 2022-06-04 Frequency: Weekly  Progress: 0 Modality: individual  Related Interventions Ask the client to complete an exercise that identifies the positives and negatives of being a victim and the positives and negatives of being a survivor; compare and process the lists. Encourage and reinforce the client's statements that reflect movement away from viewing self as a victim and toward personal empowerment as a survivor (or assign "Changing from Victim to Survivor" in the Adult Psychotherapy Homework Planner by Stephannie LiJongsma). Objective Increase level of trust of others as shown by more socialization and greater intimacy tolerance. Target Date: 2022-06-04 Frequency: Weekly  Progress: 0 Modality: individual  Related Interventions Teach the client the share-check method of building trust in relationships (sharing a little information and checking as to the recipient's sensitivity in reacting to that information). Teach the client the advantages of treating people as trustworthy given a reasonable amount of time to assess their character. 9. Participate in social performance requirements without  undue fear or anxiety. 10. Reach a personal balance between solitary time and interpersonal interaction with others. Objective Describe the history and nature of social fears and avoidance. Target Date: 2022-06-04 Frequency: Weekly  Progress: 0 Modality: individual  Related Interventions Assess the client's history of social anxiety and avoidance including frequency, intensity, and duration of anxiety symptoms, triggers, and the nature and extent of avoidance (e.g., The Anxiety Disorders Interview Schedule - Adult Version). Establish rapport with the client toward building a therapeutic alliance. Objective Verbalize an understanding of the rationale for cognitive-behavioral treatment of social anxiety. Target Date: 2022-06-04 Frequency: Weekly  Progress: 0 Modality: individual  Related Interventions Discuss how therapy based on cognitive-behavioral principles targets fear and avoidance to desensitize learned fear, build social skills, reality-test anxious thoughts, and increase confidence and social effectiveness. Objective Participate in gradual repeated exposure to feared social situations within and outside of therapy. Target Date: 2022-06-04 Frequency: Weekly  Progress: 0 Modality: individual  Related Interventions Direct and assist the client in construction of a hierarchy of anxiety-producing situations associated with the phobic response. Assign the client a homework exercise in which he/she does an exposure exercise and records responses (or assign "Gradually Reducing Your Phobic Fear" in the Adult Psychotherapy Homework Planner by Stephannie LiJongsma; also see The Shyness and Social Anxiety Workbook by [employer] and [place]inson; review and reinforce success, providing corrective feedback toward improvement. Select initial in vivo or role-played exposures that have a high likelihood of being a successful experience for the client; do cognitive restructuring within and after the exposure, use behavioral  strategies (e.g., modeling, rehearsal, social reinforcement) to facilitate progress through the hierarchy (see Cognitive-Behavioral Group Therapy for Social Phobia by Rosana HoesHeimberg and Becker; Managing Social Anxiety by [place], Laurena BeringHeimberg, and [place]). Objective Learn and implement social skills to reduce anxiety and build confidence in social interactions. Target Date: 2022-06-04 Frequency: Weekly  Progress: 0 Modality:  individual  Related Interventions Use instruction, modeling, and role-playing to build the client's general social and/or communication skills (Cognitive Behavioral Group Therapy for Social Phobia by Rosana Hoes; Managing Social Anxiety by [place], Laurena Bering, and [place]). Objective Identify important people in life, past and present, and describe the quality, good and poor, of those relationships. Target Date: 2022-06-04 Frequency: Weekly  Progress: 0 Modality: individual  Related Interventions Conduct Interpersonal Therapy (apply Comprehensive Guide to Interpersonal Psychotherapy by Dionicia Abler, and Edward Qualia) beginning with the assessment of the client's "interpersonal inventory" of important past and present relationships; develop a case formulation linking social anxiety grief, interpersonal role disputes, role transitions, and/or interpersonal deficits). Objective Identify, challenge, and replace biased, fearful self-talk with reality-based, positive self-talk. Target Date: 2022-06-04 Frequency: Weekly  Progress: 0 Modality: individual  Objective Learn and implement calming and coping strategies to manage anxiety symptoms during moments of social anxiety and lead to a more relaxed state in general. Target Date: 2022-06-04 Frequency: Weekly  Progress: 0 Modality: individual  Related Interventions Teach and ask the client to practice relaxation and attentional focusing skills (e.g., staying focused externally and on behavioral goals, muscular relaxation, evenly paced  diaphragmatic breathing, ride the wave of anxiety) for managing social anxiety symptoms and maintaining a more relaxed approach to life; review, reinforce successes; provide corrective feedback toward effective use. 11. Regulate pain in order to maximize daily functioning and return to productive employment. 12. Release the emotions associated with past childhood/family issues, resulting in less resentment and more serenity. 13. Resolve past childhood/family issues, leading to less anger and depression, greater self-esteem, security, and confidence.  Diagnosis:Major depressive disorder, recurrent episode, moderate (HCC)  Generalized anxiety disorder  PTSD (post-traumatic stress disorder)  Attention deficit hyperactivity disorder (ADHD), predominantly inattentive type  Plan:  -consider how you would like to share trauma history with Clinician including verbal or in writing, and amount of detail -meet again on Monday, September 24, 2021 at 8am.  Teofilo Pod, [facility]                  [place], [facility]

## 2021-09-12 ENCOUNTER — Encounter: Payer: Self-pay | Admitting: Family Medicine

## 2021-09-12 ENCOUNTER — Other Ambulatory Visit: Payer: Self-pay | Admitting: Family Medicine

## 2021-09-12 NOTE — Telephone Encounter (Signed)
I have never seen her for this, only an acute issue.  Being this is a DEA controlled medication she would need an appointment.  Alternatively she can ask her psychiatrist to write since this is a psychotropic medication.    Thanks!

## 2021-09-18 ENCOUNTER — Ambulatory Visit: Payer: BC Managed Care – PPO | Admitting: Professional

## 2021-09-25 ENCOUNTER — Ambulatory Visit: Payer: BC Managed Care – PPO | Admitting: Professional

## 2021-10-09 ENCOUNTER — Ambulatory Visit: Payer: BC Managed Care – PPO | Admitting: Professional

## 2021-10-17 DIAGNOSIS — F333 Major depressive disorder, recurrent, severe with psychotic symptoms: Secondary | ICD-10-CM | POA: Diagnosis not present

## 2021-10-23 ENCOUNTER — Ambulatory Visit: Payer: BC Managed Care – PPO | Admitting: Professional

## 2021-10-24 ENCOUNTER — Ambulatory Visit: Payer: BC Managed Care – PPO | Admitting: Family Medicine

## 2021-10-24 ENCOUNTER — Other Ambulatory Visit: Payer: Self-pay

## 2021-10-24 ENCOUNTER — Encounter: Payer: Self-pay | Admitting: Family Medicine

## 2021-10-24 DIAGNOSIS — H6121 Impacted cerumen, right ear: Secondary | ICD-10-CM

## 2021-10-24 DIAGNOSIS — H6120 Impacted cerumen, unspecified ear: Secondary | ICD-10-CM | POA: Insufficient documentation

## 2021-10-24 NOTE — Assessment & Plan Note (Signed)
Lavage completed today.  Tolerated well, with improvement of symptoms.  Recommend adding Debrox drops on a regular basis to help keep wax soft. ?

## 2021-10-24 NOTE — Progress Notes (Signed)
?Renee Norris - 56 y.o. female MRN [id number]  Date of birth: February 23, 1966 ? ?Subjective ?Chief Complaint  ?Patient presents with  ? Acute Visit  ? Cerumen Impaction  ? ? ?HPI ?Renee Norris is a 56 year old female here today with complaint of possible cerumen impaction.  She has had decreased hearing and feeling of stopped up ears, worsened today.  Feels like she has water trapped behind the wax.  She denies any pain or drainage from the ear.  She has had this in the past.   ? ?ROS:  A comprehensive ROS was completed and negative except as noted per HPI ? ?Allergies  ?Allergen Reactions  ? Aspirin Other (See Comments)  ?  Dizziness/swelling  ? ? ?Past Medical History:  ?Diagnosis Date  ? ADD (attention deficit disorder)   ? Chronic back pain 01/30/2016  ? folowing with Ortho for Tramadol, we are managing benzo therapy, started TCA as well, refer to PT, refer to [facility] for [facility]   ? Depression 01/30/2016  ? TCA to help this as well as pain/sleep issues, refer for CBT   ? Insomnia 01/30/2016  ? TCA, do not take with benzos   ? Postmenopausal 01/30/2016  ? HRT from OBGYN   ? ? ?Past Surgical History:  ?Procedure Laterality Date  ? ABDOMINAL SURGERY    ? AUGMENTATION MAMMAPLASTY    ? BACK SURGERY    ? BREAST SURGERY    ? GYN surgery    ? TONSILLECTOMY    ? ? ?Social History  ? ?Socioeconomic History  ? Marital status: Married  ?  Spouse name: Not on file  ? Number of children: Not on file  ? Years of education: Not on file  ? Highest education level: Not on file  ?Occupational History  ? Not on file  ?Tobacco Use  ? Smoking status: Former  ? Smokeless tobacco: Never  ?Substance and Sexual Activity  ? Alcohol use: No  ?  Comment: 2-3 glasses of wine a day  ? Drug use: No  ? Sexual activity: Not on file  ?Other Topics Concern  ? Not on file  ?Social History Narrative  ? Not on file  ? ?Social Determinants of Health  ? ?Financial Resource Strain: Not on file  ?Food Insecurity: Not on file  ?Transportation Needs: Not on file   ?Physical Activity: Not on file  ?Stress: Not on file  ?Social Connections: Not on file  ? ? ?Family History  ?Problem Relation Age of Onset  ? Diabetes Mother   ? Hypertension Mother   ? Cancer Mother   ? Diabetes Father   ? Hypertension Father   ? Cancer Father   ? ? ?Health Maintenance  ?Topic Date Due  ? HIV Screening  Never done  ? Hepatitis C Screening  Never done  ? MAMMOGRAM  06/06/2019  ? Zoster Vaccines- Shingrix (2 of 2) 06/19/2019  ? COVID-19 Vaccine (4 - Booster) 07/22/2020  ? PAP SMEAR-Modifier  08/23/2021  ? COLONOSCOPY (Pts 45-84yrs Insurance coverage will need to be confirmed)  08/08/2023  ? TETANUS/TDAP  01/04/2025  ? INFLUENZA VACCINE  Completed  ? HPV VACCINES  Aged Out  ? ? ? ?----------------------------------------------------------------------------------------------------------------------------------------------------------------------------------------------------------------- ?Physical Exam ?BP 101/68 (BP Location: Left Arm, Patient Position: Sitting, Cuff Size: Normal)   Pulse 74   Temp 98.1 ?F (36.7 ?C)   Ht 5\' 5"  (1.651 m)   Wt 167 lb (75.8 kg)   LMP 04/21/2014 (Approximate)   SpO2 98%   BMI 27.79 kg/m?  ? ?  Physical Exam ?Constitutional:   ?   Appearance: Normal appearance.  ?HENT:  ?   Ears:  ?   Comments: Impacted cerumen noted in the right ear.  Removed with lavage with some improvement of symptoms. ?Musculoskeletal:  ?   Cervical back: Neck supple.  ?Neurological:  ?   Mental Status: She is alert.  ? ? ?------------------------------------------------------------------------------------------------------------------------------------------------------------------------------------------------------------------- ?Assessment and Plan ? ?Cerumen impaction ?Lavage completed today.  Tolerated well, with improvement of symptoms.  Recommend adding Debrox drops on a regular basis to help keep wax soft. ? ? ?No orders of the defined types were placed in this encounter. ? ? ?No  follow-ups on file. ? ? ? ?This visit occurred during the SARS-CoV-2 public health emergency.  Safety protocols were in place, including screening questions prior to the visit, additional usage of staff PPE, and extensive cleaning of exam room while observing appropriate contact time as indicated for disinfecting solutions.  ? ?

## 2021-10-24 NOTE — Patient Instructions (Addendum)
Try using debrox drops to keep wax soft.  ?Follow up if symptoms return.  ?

## 2021-11-06 ENCOUNTER — Ambulatory Visit: Payer: BC Managed Care – PPO | Admitting: Professional

## 2021-11-16 ENCOUNTER — Encounter: Payer: Self-pay | Admitting: Family Medicine

## 2021-11-20 ENCOUNTER — Ambulatory Visit: Payer: BC Managed Care – PPO | Admitting: Professional

## 2021-12-04 ENCOUNTER — Ambulatory Visit: Payer: BC Managed Care – PPO | Admitting: Professional

## 2021-12-18 ENCOUNTER — Ambulatory Visit: Payer: BC Managed Care – PPO | Admitting: Professional

## 2022-01-01 ENCOUNTER — Ambulatory Visit: Payer: BC Managed Care – PPO | Admitting: Professional

## 2022-01-09 DIAGNOSIS — F333 Major depressive disorder, recurrent, severe with psychotic symptoms: Secondary | ICD-10-CM | POA: Diagnosis not present

## 2022-01-29 ENCOUNTER — Ambulatory Visit: Payer: BC Managed Care – PPO | Admitting: Professional

## 2022-02-08 DIAGNOSIS — H16142 Punctate keratitis, left eye: Secondary | ICD-10-CM | POA: Diagnosis not present

## 2022-02-12 ENCOUNTER — Ambulatory Visit: Payer: BC Managed Care – PPO | Admitting: Professional

## 2022-02-26 ENCOUNTER — Ambulatory Visit: Payer: BC Managed Care – PPO | Admitting: Professional

## 2022-03-01 DIAGNOSIS — R6889 Other general symptoms and signs: Secondary | ICD-10-CM | POA: Diagnosis not present

## 2022-03-01 DIAGNOSIS — R5383 Other fatigue: Secondary | ICD-10-CM | POA: Diagnosis not present

## 2022-03-01 DIAGNOSIS — Z8639 Personal history of other endocrine, nutritional and metabolic disease: Secondary | ICD-10-CM | POA: Diagnosis not present

## 2022-03-01 DIAGNOSIS — M5136 Other intervertebral disc degeneration, lumbar region: Secondary | ICD-10-CM | POA: Diagnosis not present

## 2022-03-01 DIAGNOSIS — E538 Deficiency of other specified B group vitamins: Secondary | ICD-10-CM | POA: Diagnosis not present

## 2022-03-01 DIAGNOSIS — K5900 Constipation, unspecified: Secondary | ICD-10-CM | POA: Diagnosis not present

## 2022-03-01 DIAGNOSIS — E559 Vitamin D deficiency, unspecified: Secondary | ICD-10-CM | POA: Diagnosis not present

## 2022-03-12 ENCOUNTER — Ambulatory Visit: Payer: BC Managed Care – PPO | Admitting: Professional

## 2022-03-22 DIAGNOSIS — M5136 Other intervertebral disc degeneration, lumbar region: Secondary | ICD-10-CM | POA: Diagnosis not present

## 2022-03-22 DIAGNOSIS — R5383 Other fatigue: Secondary | ICD-10-CM | POA: Diagnosis not present

## 2022-03-22 DIAGNOSIS — E538 Deficiency of other specified B group vitamins: Secondary | ICD-10-CM | POA: Diagnosis not present

## 2022-03-22 DIAGNOSIS — R6889 Other general symptoms and signs: Secondary | ICD-10-CM | POA: Diagnosis not present

## 2022-04-03 DIAGNOSIS — F333 Major depressive disorder, recurrent, severe with psychotic symptoms: Secondary | ICD-10-CM | POA: Diagnosis not present

## 2022-06-26 DIAGNOSIS — F333 Major depressive disorder, recurrent, severe with psychotic symptoms: Secondary | ICD-10-CM | POA: Diagnosis not present

## 2022-07-03 ENCOUNTER — Ambulatory Visit: Payer: BC Managed Care – PPO | Admitting: Family Medicine

## 2022-07-03 ENCOUNTER — Encounter: Payer: Self-pay | Admitting: Family Medicine

## 2022-07-03 VITALS — BP 113/73 | HR 56 | Ht 65.0 in | Wt 160.0 lb

## 2022-07-03 DIAGNOSIS — R0781 Pleurodynia: Secondary | ICD-10-CM | POA: Diagnosis not present

## 2022-07-03 DIAGNOSIS — Z1231 Encounter for screening mammogram for malignant neoplasm of breast: Secondary | ICD-10-CM | POA: Diagnosis not present

## 2022-07-03 DIAGNOSIS — R1011 Right upper quadrant pain: Secondary | ICD-10-CM

## 2022-07-03 DIAGNOSIS — R6881 Early satiety: Secondary | ICD-10-CM | POA: Diagnosis not present

## 2022-07-03 NOTE — Assessment & Plan Note (Signed)
Xrays of chest with rib detail ordered.  Recommend updated mammogram as well.

## 2022-07-03 NOTE — Patient Instructions (Signed)
We'll be in touch with imaging and lab results.

## 2022-07-03 NOTE — Assessment & Plan Note (Signed)
She has RUQ ttp on exam.   Additionally she is having new early satiety.  Updating labs and RUQ [place] ordered.

## 2022-07-03 NOTE — Progress Notes (Signed)
Renee Norris - 56 y.o. female MRN [id number]  Date of birth: July 03, 1966  Subjective Chief Complaint  Patient presents with   Chest Pain   Fatigue    HPI Renee Norris is a 56 y.o. female here today with complaint of L sided rib pain.   She has had rib pain for about 6 months.  She has also noted some bruising over this area.  She does not recall any trauma over this area.  She has not had any breathing difficulty.  Area is tender to touch at times.  She does work as a Teacher, adult education and doesn't think she is applying pressure over this are when leaning over massage table.  She has not noticed any breast abnormalities.  She is overdue for mammogram.   She has also noticed early satiety over the past couple of weeks. Denies reflux symptoms.  No particular foods seem to make this worse.  Denies nausea.    ROS:  A comprehensive ROS was completed and negative except as noted per HPI  Allergies  Allergen Reactions   Aspirin Other (See Comments)    Dizziness/swelling    Past Medical History:  Diagnosis Date   ADD (attention deficit disorder)    Chronic back pain 01/30/2016   folowing with Ortho for Tramadol, we are managing benzo therapy, started TCA as well, refer to PT, refer to [facility] for CBT    Depression 01/30/2016   TCA to help this as well as pain/sleep issues, refer for CBT    Insomnia 01/30/2016   TCA, do not take with benzos    Postmenopausal 01/30/2016   HRT from OBGYN     Past Surgical History:  Procedure Laterality Date   ABDOMINAL SURGERY     AUGMENTATION MAMMAPLASTY     BACK SURGERY     BREAST SURGERY     GYN surgery     TONSILLECTOMY      Social History   Socioeconomic History   Marital status: Married    Spouse name: Not on file   Number of children: Not on file   Years of education: Not on file   Highest education level: Not on file  Occupational History   Not on file  Tobacco Use   Smoking status: Former   Smokeless tobacco: Never   Substance and Sexual Activity   Alcohol use: No    Comment: 2-3 glasses of wine a day   Drug use: No   Sexual activity: Not on file  Other Topics Concern   Not on file  Social History Narrative   Not on file   Social Determinants of Health   Financial Resource Strain: Not on file  Food Insecurity: Not on file  Transportation Needs: Not on file  Physical Activity: Not on file  Stress: Not on file  Social Connections: Not on file    Family History  Problem Relation Age of Onset   Diabetes Mother    Hypertension Mother    Cancer Mother    Diabetes Father    Hypertension Father    Cancer Father     Health Maintenance  Topic Date Due   PAP SMEAR-Modifier  09/20/2022 (Originally 08/23/2021)   COVID-19 Vaccine (4 - Mixed Product risk series) 09/20/2022 (Originally 07/22/2020)   Zoster Vaccines- Shingrix (2 of 2) 10/03/2022 (Originally 06/19/2019)   INFLUENZA VACCINE  11/18/2022 (Originally 03/20/2022)   MAMMOGRAM  07/04/2023 (Originally 06/06/2019)   Hepatitis C Screening  07/04/2023 (Originally 05/14/1982)   HIV  Screening  07/04/2023 (Originally 05/15/1979)   COLONOSCOPY (Pts 45-74yrs Insurance coverage will need to be confirmed)  08/08/2023   TETANUS/TDAP  01/04/2025   HPV VACCINES  Aged Out     ----------------------------------------------------------------------------------------------------------------------------------------------------------------------------------------------------------------- Physical Exam BP 113/73 (BP Location: Left Arm, Patient Position: Sitting, Cuff Size: Small)   Pulse (!) 56   Ht 5\' 5"  (1.651 m)   Wt 160 lb (72.6 kg)   LMP 04/21/2014 (Approximate)   SpO2 98%   BMI 26.63 kg/m   Physical Exam Constitutional:      Appearance: Normal appearance. She is well-developed.  Eyes:     General: No scleral icterus. Cardiovascular:     Rate and Rhythm: Normal rate and regular rhythm.  Pulmonary:     Effort: Pulmonary effort is normal.      Breath sounds: Normal breath sounds.  Musculoskeletal:     Cervical back: Neck supple.  Neurological:     Mental Status: She is alert.  Psychiatric:        Mood and Affect: Mood normal.        Behavior: Behavior normal.     ------------------------------------------------------------------------------------------------------------------------------------------------------------------------------------------------------------------- Assessment and Plan  RUQ pain She has RUQ ttp on exam.   Additionally she is having new early satiety.  Updating labs and RUQ 06/21/2014 ordered.   Rib pain on left side Xrays of chest with rib detail ordered.  Recommend updated mammogram as well.    No orders of the defined types were placed in this encounter.   No follow-ups on file.    This visit occurred during the SARS-CoV-2 public health emergency.  Safety protocols were in place, including screening questions prior to the visit, additional usage of staff PPE, and extensive cleaning of exam room while observing appropriate contact time as indicated for disinfecting solutions.

## 2022-07-04 LAB — COMPLETE METABOLIC PANEL WITH GFR
AG Ratio: 1.7 (calc) (ref 1.0–2.5)
ALT: 13 U/L (ref 6–29)
AST: 17 U/L (ref 10–35)
Albumin: 4.5 g/dL (ref 3.6–5.1)
Alkaline phosphatase (APISO): 58 U/L (ref 37–153)
BUN: 15 mg/dL (ref 7–25)
CO2: 28 mmol/L (ref 20–32)
Calcium: 9.7 mg/dL (ref 8.6–10.4)
Chloride: 104 mmol/L (ref 98–110)
Creat: 0.86 mg/dL (ref 0.50–1.03)
Globulin: 2.6 g/dL (calc) (ref 1.9–3.7)
Glucose, Bld: 86 mg/dL (ref 65–99)
Potassium: 4.3 mmol/L (ref 3.5–5.3)
Sodium: 140 mmol/L (ref 135–146)
Total Bilirubin: 0.4 mg/dL (ref 0.2–1.2)
Total Protein: 7.1 g/dL (ref 6.1–8.1)
eGFR: 78 mL/min/{1.73_m2} (ref >=60)

## 2022-07-04 LAB — CBC WITH DIFFERENTIAL/PLATELET
Absolute Monocytes: 248 cells/uL (ref 200–950)
Basophils Absolute: 22 cells/uL (ref 0–200)
Basophils Relative: 0.6 %
Eosinophils Absolute: 61 cells/uL (ref 15–500)
Eosinophils Relative: 1.7 %
HCT: 42.7 % (ref 35.0–45.0)
Hemoglobin: 14.3 g/dL (ref 11.7–15.5)
Lymphs Abs: 1800 cells/uL (ref 850–3900)
MCH: 31 pg (ref 27.0–33.0)
MCHC: 33.5 g/dL (ref 32.0–36.0)
MCV: 92.6 fL (ref 80.0–100.0)
MPV: 9.6 fL (ref 7.5–12.5)
Monocytes Relative: 6.9 %
Neutro Abs: 1469 cells/uL — ABNORMAL LOW (ref 1500–7800)
Neutrophils Relative %: 40.8 %
Platelets: 320 10*3/uL (ref 140–400)
RBC: 4.61 10*6/uL (ref 3.80–5.10)
RDW: 12.4 % (ref 11.0–15.0)
Total Lymphocyte: 50 %
WBC: 3.6 10*3/uL — ABNORMAL LOW (ref 3.8–10.8)

## 2022-07-05 ENCOUNTER — Ambulatory Visit (INDEPENDENT_AMBULATORY_CARE_PROVIDER_SITE_OTHER): Payer: BC Managed Care – PPO

## 2022-07-05 ENCOUNTER — Ambulatory Visit: Payer: BC Managed Care – PPO

## 2022-07-05 DIAGNOSIS — R1011 Right upper quadrant pain: Secondary | ICD-10-CM

## 2022-07-05 DIAGNOSIS — Z1231 Encounter for screening mammogram for malignant neoplasm of breast: Secondary | ICD-10-CM | POA: Diagnosis not present

## 2022-07-05 DIAGNOSIS — R6881 Early satiety: Secondary | ICD-10-CM | POA: Diagnosis not present

## 2022-07-24 ENCOUNTER — Other Ambulatory Visit: Payer: Self-pay | Admitting: Family Medicine

## 2022-07-24 DIAGNOSIS — N941 Unspecified dyspareunia: Secondary | ICD-10-CM

## 2022-07-25 ENCOUNTER — Encounter: Payer: Self-pay | Admitting: Family Medicine

## 2022-09-06 ENCOUNTER — Telehealth: Payer: Self-pay

## 2022-09-06 NOTE — Telephone Encounter (Signed)
Pt lvm stating her right ear is clogged. Asking if a referral to ENT is the next course of action. Please advise.

## 2022-09-07 NOTE — Telephone Encounter (Signed)
We can check her and see if it needs to be cleaned out or if she wants to proceed with ENT referral I can go ahead and place this.   CM

## 2022-09-12 NOTE — Telephone Encounter (Signed)
LVM for patient callback concerning ear cleaning or referral to ENT. Also sent MyChart message.

## 2022-09-14 ENCOUNTER — Ambulatory Visit (INDEPENDENT_AMBULATORY_CARE_PROVIDER_SITE_OTHER): Payer: BC Managed Care – PPO | Admitting: Medical-Surgical

## 2022-09-14 DIAGNOSIS — H6123 Impacted cerumen, bilateral: Secondary | ICD-10-CM | POA: Diagnosis not present

## 2022-09-14 NOTE — Progress Notes (Signed)
Agree with documentation as below.  ___________________________________________ Shanda Cadotte L. Din Bookwalter, DNP, APRN, FNP-BC Primary Care and Sports Medicine [facility] [place] [facility]  

## 2022-09-14 NOTE — Progress Notes (Signed)
Pt here to have her ears checked for possible cerumen impaction and removal.   After checking her ears, I noticed that she did have bilateral cerumen impacted bilaterally and proceeded with irrigation.   She tolerated the irrigation well.

## 2022-09-18 DIAGNOSIS — F333 Major depressive disorder, recurrent, severe with psychotic symptoms: Secondary | ICD-10-CM | POA: Diagnosis not present

## 2022-09-20 DIAGNOSIS — N9412 Deep dyspareunia: Secondary | ICD-10-CM | POA: Diagnosis not present

## 2022-09-20 DIAGNOSIS — N952 Postmenopausal atrophic vaginitis: Secondary | ICD-10-CM | POA: Diagnosis not present

## 2022-09-20 DIAGNOSIS — M9905 Segmental and somatic dysfunction of pelvic region: Secondary | ICD-10-CM | POA: Diagnosis not present

## 2022-12-11 DIAGNOSIS — F333 Major depressive disorder, recurrent, severe with psychotic symptoms: Secondary | ICD-10-CM | POA: Diagnosis not present

## 2022-12-27 DIAGNOSIS — Z6828 Body mass index (BMI) 28.0-28.9, adult: Secondary | ICD-10-CM | POA: Diagnosis not present

## 2022-12-27 DIAGNOSIS — Z01419 Encounter for gynecological examination (general) (routine) without abnormal findings: Secondary | ICD-10-CM | POA: Diagnosis not present

## 2022-12-27 DIAGNOSIS — N952 Postmenopausal atrophic vaginitis: Secondary | ICD-10-CM | POA: Diagnosis not present

## 2022-12-27 DIAGNOSIS — N9412 Deep dyspareunia: Secondary | ICD-10-CM | POA: Diagnosis not present

## 2022-12-27 DIAGNOSIS — Z01411 Encounter for gynecological examination (general) (routine) with abnormal findings: Secondary | ICD-10-CM | POA: Diagnosis not present

## 2022-12-27 DIAGNOSIS — R87810 Cervical high risk human papillomavirus (HPV) DNA test positive: Secondary | ICD-10-CM | POA: Diagnosis not present

## 2023-03-05 DIAGNOSIS — F333 Major depressive disorder, recurrent, severe with psychotic symptoms: Secondary | ICD-10-CM | POA: Diagnosis not present

## 2023-03-12 ENCOUNTER — Encounter: Payer: Self-pay | Admitting: Family Medicine

## 2023-03-12 ENCOUNTER — Ambulatory Visit: Payer: BC Managed Care – PPO | Admitting: Family Medicine

## 2023-03-12 VITALS — BP 125/80 | HR 58 | Ht 65.0 in | Wt 167.0 lb

## 2023-03-12 DIAGNOSIS — R8290 Unspecified abnormal findings in urine: Secondary | ICD-10-CM | POA: Diagnosis not present

## 2023-03-12 DIAGNOSIS — R109 Unspecified abdominal pain: Secondary | ICD-10-CM | POA: Diagnosis not present

## 2023-03-12 DIAGNOSIS — R1032 Left lower quadrant pain: Secondary | ICD-10-CM | POA: Diagnosis not present

## 2023-03-12 LAB — POCT URINALYSIS DIP (CLINITEK)
Bilirubin, UA: NEGATIVE
Blood, UA: NEGATIVE
Glucose, UA: NEGATIVE mg/dL
Ketones, POC UA: NEGATIVE mg/dL
Leukocytes, UA: NEGATIVE
Nitrite, UA: NEGATIVE
POC PROTEIN,UA: NEGATIVE
Spec Grav, UA: 1.01 (ref 1.010–1.025)
Urobilinogen, UA: 0.2 E.U./dL
pH, UA: 7 (ref 5.0–8.0)

## 2023-03-12 NOTE — Progress Notes (Signed)
Renee Norris - 57 y.o. female MRN [id number]  Date of birth is a [birth year] Renee Norris 1966/01/04  Subjective Chief Complaint  Patient presents with   Urinary Tract Infection   Shortness of Breath   Back Pain    HPI Renee Norris is a 57 y.o. female here today with complaint of flank and abdominal pain.  Symptoms started about 4 to 5 days ago.  Pain is located more so on the right side.  Some pain along scar where she had removal of spinal cord stimulator.  She is not having dysuria, frequency, urgency.  She does report that her urine has had a "yeasty smell".  Denies vaginal itching or discharge.  Her bowels are moving normally.  She has tried to massage the area out and does have some improvement today compared to a few days ago.  ROS:  A comprehensive ROS was completed and negative except as noted per HPI  Allergies  Allergen Reactions   Aspirin Other (See Comments)    Dizziness/swelling    Past Medical History:  Diagnosis Date   ADD (attention deficit disorder)    Chronic back pain 01/30/2016   folowing with Ortho for Tramadol, we are managing benzo therapy, started TCA as well, refer to PT, refer to [facility] for CBT    Depression 01/30/2016   TCA to help this as well as pain/sleep issues, refer for CBT    Insomnia 01/30/2016   TCA, do not take with benzos    Postmenopausal 01/30/2016   HRT from OBGYN     Past Surgical History:  Procedure Laterality Date   ABDOMINAL SURGERY     AUGMENTATION MAMMAPLASTY     BACK SURGERY     BREAST SURGERY     GYN surgery     TONSILLECTOMY      Social History   Socioeconomic History   Marital status: Married    Spouse name: Not on file   Number of children: Not on file   Years of education: Not on file   Highest education level: Not on file  Occupational History   Not on file  Tobacco Use   Smoking status: Former   Smokeless tobacco: Never  Substance and Sexual Activity   Alcohol use: No    Comment: 2-3 glasses of wine a day    Drug use: No   Sexual activity: Not on file  Other Topics Concern   Not on file  Social History Narrative   Not on file   Social Determinants of Health   Financial Resource Strain: Not on file  Food Insecurity: Not on file  Transportation Needs: Not on file  Physical Activity: Not on file  Stress: Not on file  Social Connections: Unknown (12/27/2021)   Received from [facility], [facility]   Social Network    Social Network: Not on file    Family History  Problem Relation Age of Onset   Diabetes Mother    Hypertension Mother    Cancer Mother    Diabetes Father    Hypertension Father    Cancer Father     Health Maintenance  Topic Date Due   Zoster Vaccines- Shingrix (2 of 2) 06/19/2019   PAP SMEAR-Modifier  08/23/2021   COVID-19 Vaccine (4 - 2023-24 season) 04/20/2022   Hepatitis C Screening  07/04/2023 (Originally 05/14/1982)   HIV Screening  07/04/2023 (Originally 05/15/1979)   INFLUENZA VACCINE  03/21/2023   Colonoscopy  08/08/2023   MAMMOGRAM  07/05/2024  DTaP/Tdap/Td (2 - Td or Tdap) 01/04/2025   HPV VACCINES  Aged Out     ----------------------------------------------------------------------------------------------------------------------------------------------------------------------------------------------------------------- Physical Exam BP 125/80 (BP Location: Left Arm, Patient Position: Sitting, Cuff Size: Normal)   Pulse (!) 58   Ht 5\' 5"  (1.651 m)   Wt 167 lb (75.8 kg)   LMP 04/21/2014 (Approximate)   SpO2 98%   BMI 27.79 kg/m   Physical Exam Constitutional:      Appearance: She is well-developed.  HENT:     Head: Normocephalic and atraumatic.  Eyes:     General: No scleral icterus. Cardiovascular:     Rate and Rhythm: Normal rate and regular rhythm.  Pulmonary:     Effort: Pulmonary effort is normal.     Breath sounds: Normal breath sounds.  Abdominal:     General: There is no distension.     Tenderness: There is no abdominal  tenderness. There is no right CVA tenderness or left CVA tenderness.     Comments: Tenderness palpation along there right side lower ribs.  Musculoskeletal:     Cervical back: Neck supple.  Neurological:     Mental Status: She is alert.  Psychiatric:        Mood and Affect: Mood normal.        Behavior: Behavior normal.     ------------------------------------------------------------------------------------------------------------------------------------------------------------------------------------------------------------------- Assessment and Plan  Right flank pain Normal urinalysis.  Checking renal function and CBC.  Seems to be more musculoskeletal in nature as she is having some improvement with stretching and massage.  She will let me know if symptoms worsen.   No orders of the defined types were placed in this encounter.   No follow-ups on file.    This visit occurred during the SARS-CoV-2 public health emergency.  Safety protocols were in place, including screening questions prior to the visit, additional usage of staff PPE, and extensive cleaning of exam room while observing appropriate contact time as indicated for disinfecting solutions.

## 2023-03-12 NOTE — Assessment & Plan Note (Signed)
Normal urinalysis.  Checking renal function and CBC.  Seems to be more musculoskeletal in nature as she is having some improvement with stretching and massage.  She will let me know if symptoms worsen.

## 2023-03-13 DIAGNOSIS — R5383 Other fatigue: Secondary | ICD-10-CM | POA: Diagnosis not present

## 2023-03-13 DIAGNOSIS — N951 Menopausal and female climacteric states: Secondary | ICD-10-CM | POA: Diagnosis not present

## 2023-03-13 DIAGNOSIS — E039 Hypothyroidism, unspecified: Secondary | ICD-10-CM | POA: Diagnosis not present

## 2023-03-13 DIAGNOSIS — E063 Autoimmune thyroiditis: Secondary | ICD-10-CM | POA: Diagnosis not present

## 2023-03-13 DIAGNOSIS — E559 Vitamin D deficiency, unspecified: Secondary | ICD-10-CM | POA: Diagnosis not present

## 2023-03-13 DIAGNOSIS — E785 Hyperlipidemia, unspecified: Secondary | ICD-10-CM | POA: Diagnosis not present

## 2023-03-13 DIAGNOSIS — E538 Deficiency of other specified B group vitamins: Secondary | ICD-10-CM | POA: Diagnosis not present

## 2023-03-13 LAB — CBC WITH DIFFERENTIAL/PLATELET
Basophils Absolute: 0 10*3/uL (ref 0.0–0.2)
Basos: 1 %
EOS (ABSOLUTE): 0.1 10*3/uL (ref 0.0–0.4)
Eos: 1 %
Hematocrit: 41.5 % (ref 34.0–46.6)
Hemoglobin: 13.5 g/dL (ref 11.1–15.9)
Immature Grans (Abs): 0 10*3/uL (ref 0.0–0.1)
Immature Granulocytes: 0 %
Lymphocytes Absolute: 2.4 10*3/uL (ref 0.7–3.1)
Lymphs: 50 %
MCH: 30.1 pg (ref 26.6–33.0)
MCHC: 32.5 g/dL (ref 31.5–35.7)
MCV: 93 fL (ref 79–97)
Monocytes Absolute: 0.3 10*3/uL (ref 0.1–0.9)
Monocytes: 7 %
Neutrophils Absolute: 1.9 10*3/uL (ref 1.4–7.0)
Neutrophils: 41 %
Platelets: 273 10*3/uL (ref 150–450)
RBC: 4.48 x10E6/uL (ref 3.77–5.28)
RDW: 12.5 % (ref 11.7–15.4)
WBC: 4.7 10*3/uL (ref 3.4–10.8)

## 2023-03-13 LAB — CMP14+EGFR
ALT: 11 IU/L (ref 0–32)
AST: 19 IU/L (ref 0–40)
Albumin: 4.6 g/dL (ref 3.8–4.9)
Alkaline Phosphatase: 64 IU/L (ref 44–121)
BUN/Creatinine Ratio: 16 (ref 9–23)
BUN: 13 mg/dL (ref 6–24)
Bilirubin Total: 0.2 mg/dL (ref 0.0–1.2)
CO2: 23 mmol/L (ref 20–29)
Calcium: 9.6 mg/dL (ref 8.7–10.2)
Chloride: 99 mmol/L (ref 96–106)
Creatinine, Ser: 0.81 mg/dL (ref 0.57–1.00)
Globulin, Total: 2.1 g/dL (ref 1.5–4.5)
Glucose: 87 mg/dL (ref 70–99)
Potassium: 4.2 mmol/L (ref 3.5–5.2)
Sodium: 137 mmol/L (ref 134–144)
Total Protein: 6.7 g/dL (ref 6.0–8.5)
eGFR: 84 mL/min/{1.73_m2} (ref >59)

## 2023-03-27 DIAGNOSIS — M5136 Other intervertebral disc degeneration, lumbar region: Secondary | ICD-10-CM | POA: Diagnosis not present

## 2023-03-27 DIAGNOSIS — E559 Vitamin D deficiency, unspecified: Secondary | ICD-10-CM | POA: Diagnosis not present

## 2023-03-27 DIAGNOSIS — R5383 Other fatigue: Secondary | ICD-10-CM | POA: Diagnosis not present

## 2023-03-27 DIAGNOSIS — E538 Deficiency of other specified B group vitamins: Secondary | ICD-10-CM | POA: Diagnosis not present

## 2023-03-29 ENCOUNTER — Ambulatory Visit
Admission: EM | Admit: 2023-03-29 | Discharge: 2023-03-29 | Disposition: A | Discharge status: Home / Self Care | Payer: BC Managed Care – PPO | Attending: Family Medicine | Admitting: Family Medicine

## 2023-03-29 DIAGNOSIS — L03116 Cellulitis of left lower limb: Secondary | ICD-10-CM

## 2023-03-29 DIAGNOSIS — T63441A Toxic effect of venom of bees, accidental (unintentional), initial encounter: Secondary | ICD-10-CM | POA: Diagnosis not present

## 2023-03-29 MED ORDER — DOXYCYCLINE HYCLATE 100 MG PO CAPS: 100.0000 mg | ORAL_CAPSULE | Freq: Two times a day (BID) | ORAL | 0 refills | Status: AC

## 2023-03-29 MED ORDER — PREDNISONE 10 MG (21) PO TBPK: ORAL_TABLET | Freq: Every day | ORAL | 0 refills | Status: AC

## 2023-03-29 NOTE — Discharge Instructions (Addendum)
Advised patient to take medications as directed with food to completion.  Advised patient to take prednisone with first dose of doxycycline for the next 10 days.  Encouraged increase daily water intake to 64 ounces per day while taking these medications.  Advised if symptoms worsen and/or unresolved please follow-up with PCP or here for further evaluation.

## 2023-03-29 NOTE — ED Provider Notes (Signed)
[name]    CSN: [id number] Arrival date & time: 03/29/23  1633      History   Chief Complaint Chief Complaint  Patient presents with   Insect Bite   Foot Swelling    HPI Renee Norris is a 57 y.o. female.   HPI 57 year old female presents with left foot swelling since 1 PM.  Patient believes that she may have been stung or bitten by a snake.  Reports redness and tender to touch.  Patient denies any other complaints or concerns today.  PMH significant for chronic back pain and ADD.  Past Medical History:  Diagnosis Date   ADD (attention deficit disorder)    Chronic back pain 01/30/2016   folowing with Ortho for Tramadol, we are managing benzo therapy, started TCA as well, refer to PT, refer to [facility] for CBT    Depression 01/30/2016   TCA to help this as well as pain/sleep issues, refer for CBT    Insomnia 01/30/2016   TCA, do not take with benzos    Postmenopausal 01/30/2016   HRT from OBGYN     Patient Active Problem List   Diagnosis Date Noted   Right flank pain 03/12/2023   RUQ pain 07/03/2022   Cerumen impaction 10/24/2021   Attention deficit hyperactivity disorder (ADHD), predominantly inattentive type 09/11/2021   Major depressive disorder, recurrent episode, moderate (HCC) 08/04/2021   Generalized anxiety disorder 08/04/2021   PTSD (post-traumatic stress disorder) 08/04/2021   Heberden's nodes 03/02/2020   Familial hypercholesterolemia 01/16/2019   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   Spondylosis without myelopathy or radiculopathy, lumbar region 05/10/2016   Lumbar spinal stenosis 03/29/2016   H/O mammogram 02/14/2016   History of bradycardia 02/14/2016   Chronic back pain 01/30/2016   Depression 01/30/2016   Other fatigue 01/30/2016   Postmenopausal 01/30/2016   Annual physical exam 01/05/2015    Past Surgical History:  Procedure Laterality Date   ABDOMINAL SURGERY     AUGMENTATION MAMMAPLASTY     BACK SURGERY      BREAST SURGERY     GYN surgery     TONSILLECTOMY      OB History   No obstetric history on file.      Home Medications    Prior to Admission medications   Medication Sig Start Date End Date Taking? Authorizing Provider  doxycycline (VIBRAMYCIN) 100 MG capsule Take 1 capsule (100 mg total) by mouth 2 (two) times daily for 10 days. 03/29/23 04/08/23 Yes Trevor Iha, FNP  predniSONE (STERAPRED UNI-PAK 21 TAB) 10 MG (21) TBPK tablet Take by mouth daily. Take 6 tabs by mouth daily  for 2 days, then 5 tabs for 2 days, then 4 tabs for 2 days, then 3 tabs for 2 days, 2 tabs for 2 days, then 1 tab by mouth daily for 2 days 03/29/23  Yes Trevor Iha, FNP  amphetamine-dextroamphetamine (ADDERALL XR) 30 MG 24 hr capsule Take 1 capsule by mouth every morning.    [provider]  Estradiol 10 MCG TABS vaginal tablet Place 1 tablet vaginally 2 (two) times a week. 01/04/23   [provider]    Family History Family History  Problem Relation Age of Onset   Diabetes Mother    Hypertension Mother    Cancer Mother    Diabetes Father    Hypertension Father    Cancer Father     Social History Social History   Tobacco Use   Smoking status: Former   Smokeless  tobacco: Never  Substance Use Topics   Alcohol use: No    Comment: 2-3 glasses of wine a day   Drug use: No     Allergies   Aspirin   Review of Systems Review of Systems  Skin:  Positive for rash.     Physical Exam Triage Vital Signs ED Triage Vitals  Encounter Vitals Group     BP 03/29/23 1646 121/82     Systolic BP Percentile --      Diastolic BP Percentile --      Pulse Rate 03/29/23 1646 74     Resp 03/29/23 1646 17     Temp 03/29/23 1646 98.3 F (36.8 C)     Temp Source 03/29/23 1646 Oral     SpO2 03/29/23 1646 99 %     Weight --      Height --      Head Circumference --      Peak Flow --      Pain Score 03/29/23 1645 3     Pain Loc --      Pain Education --      Exclude from Growth Chart  --    No data found.  Updated Vital Signs BP 121/82 (BP Location: Right Arm)   Pulse 74   Temp 98.3 F (36.8 C) (Oral)   Resp 17   LMP 04/21/2014 (Approximate)   SpO2 99%      Physical Exam Vitals and nursing note reviewed.  Constitutional:      Appearance: Normal appearance. She is normal weight.  HENT:     Head: Normocephalic and atraumatic.     Mouth/Throat:     Mouth: Mucous membranes are moist.     Pharynx: Oropharynx is clear.  Eyes:     Extraocular Movements: Extraocular movements intact.     Conjunctiva/sclera: Conjunctivae normal.     Pupils: Pupils are equal, round, and reactive to light.  Cardiovascular:     Rate and Rhythm: Normal rate and regular rhythm.     Pulses: Normal pulses.     Heart sounds: Normal heart sounds.  Pulmonary:     Effort: Pulmonary effort is normal.     Breath sounds: Normal breath sounds. No wheezing, rhonchi or rales.  Musculoskeletal:        General: Normal range of motion.     Cervical back: Normal range of motion and neck supple.  Skin:    General: Skin is warm and dry.     Comments: Left foot (dorsum including toes): Erythematous with moderate soft tissue swelling noted-please see image below  Neurological:     General: No focal deficit present.     Mental Status: She is alert and oriented to person, place, and time. Mental status is at baseline.  Psychiatric:        Mood and Affect: Mood normal.        Behavior: Behavior normal.        Thought Content: Thought content normal.      UC Treatments / Results  Labs (all labs ordered are listed, but only abnormal results are displayed) Labs Reviewed - No data to display  EKG   Radiology No results found.  Procedures Procedures (including critical care time)  Medications Ordered in UC Medications - No data to display  Initial Impression / Assessment and Plan / UC Course  I have reviewed the triage vital signs and the nursing notes.  Pertinent labs & imaging  results that were available during  my care of the patient were reviewed by me and considered in my medical decision making (see chart for details).     MDM: 1.  Cellulitis of left foot-Rx'd Doxycycline 100 mg capsule twice daily x 10 days; 2.  Bee sting, accidental, or unintentional, initial encounter-Rx'd Sterapred Unipak (tapering from 60 mg to 10 mg over 10 days). Advised patient to take medications as directed with food to completion.  Advised patient to take prednisone with first dose of doxycycline for the next 10 days.  Encouraged increase daily water intake to 64 ounces per day while taking these medications.  Advised if symptoms worsen and/or unresolved please follow-up with PCP or here for further evaluation. Final Clinical Impressions(s) / UC Diagnoses   Final diagnoses:  Cellulitis of left foot  Bee sting, accidental or unintentional, initial encounter     Discharge Instructions      Advised patient to take medications as directed with food to completion.  Advised patient to take prednisone with first dose of doxycycline for the next 10 days.  Encouraged increase daily water intake to 64 ounces per day while taking these medications.  Advised if symptoms worsen and/or unresolved please follow-up with PCP or here for further evaluation.     ED Prescriptions     Medication Sig Dispense Auth. Provider   predniSONE (STERAPRED UNI-PAK 21 TAB) 10 MG (21) TBPK tablet Take by mouth daily. Take 6 tabs by mouth daily  for 2 days, then 5 tabs for 2 days, then 4 tabs for 2 days, then 3 tabs for 2 days, 2 tabs for 2 days, then 1 tab by mouth daily for 2 days 42 tablet Trevor Iha, FNP   doxycycline (VIBRAMYCIN) 100 MG capsule Take 1 capsule (100 mg total) by mouth 2 (two) times daily for 10 days. 20 capsule Trevor Iha, FNP      PDMP not reviewed this encounter.   Trevor Iha, FNP 03/29/23 1726

## 2023-03-29 NOTE — ED Triage Notes (Signed)
Pt presents with c/o lt foot swelling since 1300. Pt states she believes a bee might have stung her or a snake might have bitten her. Reports redness and pain to the touch. Denies bruising, states it hurts to touch or walk on.

## 2023-04-01 DIAGNOSIS — M47816 Spondylosis without myelopathy or radiculopathy, lumbar region: Secondary | ICD-10-CM | POA: Diagnosis not present

## 2023-04-01 DIAGNOSIS — M4316 Spondylolisthesis, lumbar region: Secondary | ICD-10-CM | POA: Diagnosis not present

## 2023-04-01 DIAGNOSIS — M545 Low back pain, unspecified: Secondary | ICD-10-CM | POA: Diagnosis not present

## 2023-04-08 DIAGNOSIS — M461 Sacroiliitis, not elsewhere classified: Secondary | ICD-10-CM | POA: Diagnosis not present

## 2023-04-08 DIAGNOSIS — M545 Low back pain, unspecified: Secondary | ICD-10-CM | POA: Diagnosis not present

## 2023-04-15 DIAGNOSIS — M545 Low back pain, unspecified: Secondary | ICD-10-CM | POA: Diagnosis not present

## 2023-04-15 DIAGNOSIS — M461 Sacroiliitis, not elsewhere classified: Secondary | ICD-10-CM | POA: Diagnosis not present

## 2023-04-25 ENCOUNTER — Other Ambulatory Visit: Payer: Self-pay | Admitting: Oncology

## 2023-04-25 DIAGNOSIS — Z006 Encounter for examination for normal comparison and control in clinical research program: Secondary | ICD-10-CM

## 2023-05-05 DIAGNOSIS — M4726 Other spondylosis with radiculopathy, lumbar region: Secondary | ICD-10-CM | POA: Diagnosis not present

## 2023-05-06 DIAGNOSIS — M545 Low back pain, unspecified: Secondary | ICD-10-CM | POA: Diagnosis not present

## 2023-05-06 DIAGNOSIS — M461 Sacroiliitis, not elsewhere classified: Secondary | ICD-10-CM | POA: Diagnosis not present

## 2023-05-10 DIAGNOSIS — M4316 Spondylolisthesis, lumbar region: Secondary | ICD-10-CM | POA: Diagnosis not present

## 2023-05-10 DIAGNOSIS — M545 Low back pain, unspecified: Secondary | ICD-10-CM | POA: Diagnosis not present

## 2023-05-10 DIAGNOSIS — M5116 Intervertebral disc disorders with radiculopathy, lumbar region: Secondary | ICD-10-CM | POA: Diagnosis not present

## 2023-05-10 DIAGNOSIS — M461 Sacroiliitis, not elsewhere classified: Secondary | ICD-10-CM | POA: Diagnosis not present

## 2023-05-28 DIAGNOSIS — M5116 Intervertebral disc disorders with radiculopathy, lumbar region: Secondary | ICD-10-CM | POA: Diagnosis not present

## 2023-06-03 DIAGNOSIS — F333 Major depressive disorder, recurrent, severe with psychotic symptoms: Secondary | ICD-10-CM | POA: Diagnosis not present

## 2023-06-13 ENCOUNTER — Encounter: Payer: Self-pay | Admitting: Family Medicine

## 2023-08-27 DIAGNOSIS — F333 Major depressive disorder, recurrent, severe with psychotic symptoms: Secondary | ICD-10-CM | POA: Diagnosis not present

## 2023-08-28 DIAGNOSIS — K635 Polyp of colon: Secondary | ICD-10-CM | POA: Diagnosis not present

## 2023-08-28 DIAGNOSIS — Z860101 Personal history of adenomatous and serrated colon polyps: Secondary | ICD-10-CM | POA: Diagnosis not present

## 2023-08-28 DIAGNOSIS — Z1211 Encounter for screening for malignant neoplasm of colon: Secondary | ICD-10-CM | POA: Diagnosis not present

## 2023-08-28 DIAGNOSIS — Z09 Encounter for follow-up examination after completed treatment for conditions other than malignant neoplasm: Secondary | ICD-10-CM | POA: Diagnosis not present

## 2023-08-28 DIAGNOSIS — E7800 Pure hypercholesterolemia, unspecified: Secondary | ICD-10-CM | POA: Diagnosis not present

## 2023-09-04 DIAGNOSIS — R1013 Epigastric pain: Secondary | ICD-10-CM | POA: Diagnosis not present

## 2023-09-04 DIAGNOSIS — K5730 Diverticulosis of large intestine without perforation or abscess without bleeding: Secondary | ICD-10-CM | POA: Diagnosis not present

## 2023-09-04 DIAGNOSIS — R109 Unspecified abdominal pain: Secondary | ICD-10-CM | POA: Diagnosis not present

## 2023-09-13 ENCOUNTER — Encounter: Payer: Self-pay | Admitting: Family Medicine

## 2023-10-14 DIAGNOSIS — M79641 Pain in right hand: Secondary | ICD-10-CM | POA: Diagnosis not present

## 2023-10-22 DIAGNOSIS — N951 Menopausal and female climacteric states: Secondary | ICD-10-CM | POA: Diagnosis not present

## 2023-10-22 DIAGNOSIS — R5383 Other fatigue: Secondary | ICD-10-CM | POA: Diagnosis not present

## 2023-10-30 DIAGNOSIS — C44619 Basal cell carcinoma of skin of left upper limb, including shoulder: Secondary | ICD-10-CM | POA: Diagnosis not present

## 2023-11-11 DIAGNOSIS — B078 Other viral warts: Secondary | ICD-10-CM | POA: Diagnosis not present

## 2023-11-11 DIAGNOSIS — C44619 Basal cell carcinoma of skin of left upper limb, including shoulder: Secondary | ICD-10-CM | POA: Diagnosis not present

## 2023-11-19 DIAGNOSIS — F333 Major depressive disorder, recurrent, severe with psychotic symptoms: Secondary | ICD-10-CM | POA: Diagnosis not present

## 2024-01-02 DIAGNOSIS — Z01419 Encounter for gynecological examination (general) (routine) without abnormal findings: Secondary | ICD-10-CM | POA: Diagnosis not present

## 2024-01-02 DIAGNOSIS — Z6829 Body mass index (BMI) 29.0-29.9, adult: Secondary | ICD-10-CM | POA: Diagnosis not present

## 2024-01-02 DIAGNOSIS — R87810 Cervical high risk human papillomavirus (HPV) DNA test positive: Secondary | ICD-10-CM | POA: Diagnosis not present

## 2024-01-02 DIAGNOSIS — F329 Major depressive disorder, single episode, unspecified: Secondary | ICD-10-CM | POA: Diagnosis not present

## 2024-01-15 ENCOUNTER — Encounter: Payer: Self-pay | Admitting: Family Medicine

## 2024-01-16 DIAGNOSIS — M5459 Other low back pain: Secondary | ICD-10-CM | POA: Diagnosis not present

## 2024-01-28 DIAGNOSIS — L820 Inflamed seborrheic keratosis: Secondary | ICD-10-CM | POA: Diagnosis not present

## 2024-03-23 ENCOUNTER — Encounter: Payer: Self-pay | Admitting: Family Medicine

## 2024-03-23 NOTE — Telephone Encounter (Signed)
 Skippy can you look into this ?

## 2024-04-07 ENCOUNTER — Telehealth: Payer: Self-pay | Admitting: Family Medicine

## 2024-04-07 NOTE — Telephone Encounter (Signed)
 Copied from CRM [id number]. Topic: Appointments - Transfer of Care >> Apr 07, 2024  3:03 PM Carrielelia G wrote: Pt is requesting to transfer FROM: Dr Velma Ku Pt is requesting to transfer TO: Benton Gave  Reason for requested transfer: prefers a female provider

## 2024-04-07 NOTE — Telephone Encounter (Signed)
 Okay with me. Thanks

## 2024-04-08 NOTE — Telephone Encounter (Signed)
 Called patient left message to call back and schedule an appt with [facility]

## 2024-04-16 DIAGNOSIS — F333 Major depressive disorder, recurrent, severe with psychotic symptoms: Secondary | ICD-10-CM | POA: Diagnosis not present

## 2024-04-17 DIAGNOSIS — D485 Neoplasm of uncertain behavior of skin: Secondary | ICD-10-CM | POA: Diagnosis not present

## 2024-04-17 DIAGNOSIS — L608 Other nail disorders: Secondary | ICD-10-CM | POA: Diagnosis not present

## 2024-04-17 DIAGNOSIS — L821 Other seborrheic keratosis: Secondary | ICD-10-CM | POA: Diagnosis not present

## 2024-04-17 DIAGNOSIS — Z1283 Encounter for screening for malignant neoplasm of skin: Secondary | ICD-10-CM | POA: Diagnosis not present

## 2024-05-07 DIAGNOSIS — K080 Exfoliation of teeth due to systemic causes: Secondary | ICD-10-CM | POA: Diagnosis not present

## 2024-05-19 ENCOUNTER — Other Ambulatory Visit: Payer: Self-pay | Admitting: Family Medicine

## 2024-05-19 DIAGNOSIS — G8929 Other chronic pain: Secondary | ICD-10-CM

## 2024-05-20 ENCOUNTER — Encounter: Payer: Self-pay | Admitting: Family Medicine

## 2024-05-25 ENCOUNTER — Other Ambulatory Visit

## 2024-05-26 ENCOUNTER — Ambulatory Visit: Admitting: Urgent Care

## 2024-06-03 DIAGNOSIS — L821 Other seborrheic keratosis: Secondary | ICD-10-CM | POA: Diagnosis not present

## 2024-06-03 DIAGNOSIS — L719 Rosacea, unspecified: Secondary | ICD-10-CM | POA: Diagnosis not present

## 2024-06-03 DIAGNOSIS — D485 Neoplasm of uncertain behavior of skin: Secondary | ICD-10-CM | POA: Diagnosis not present

## 2024-06-08 DIAGNOSIS — S76011A Strain of muscle, fascia and tendon of right hip, initial encounter: Secondary | ICD-10-CM | POA: Diagnosis not present

## 2024-06-17 ENCOUNTER — Other Ambulatory Visit: Payer: Self-pay | Admitting: Medical Genetics

## 2024-06-17 DIAGNOSIS — Z006 Encounter for examination for normal comparison and control in clinical research program: Secondary | ICD-10-CM

## 2024-06-22 DIAGNOSIS — C44712 Basal cell carcinoma of skin of right lower limb, including hip: Secondary | ICD-10-CM | POA: Diagnosis not present

## 2024-06-22 DIAGNOSIS — D485 Neoplasm of uncertain behavior of skin: Secondary | ICD-10-CM | POA: Diagnosis not present

## 2024-06-25 DIAGNOSIS — G5701 Lesion of sciatic nerve, right lower limb: Secondary | ICD-10-CM | POA: Diagnosis not present

## 2024-06-25 DIAGNOSIS — M76891 Other specified enthesopathies of right lower limb, excluding foot: Secondary | ICD-10-CM | POA: Diagnosis not present

## 2024-07-09 DIAGNOSIS — S0501XA Injury of conjunctiva and corneal abrasion without foreign body, right eye, initial encounter: Secondary | ICD-10-CM | POA: Diagnosis not present

## 2024-07-14 DIAGNOSIS — C44712 Basal cell carcinoma of skin of right lower limb, including hip: Secondary | ICD-10-CM | POA: Diagnosis not present

## 2024-07-14 DIAGNOSIS — L03115 Cellulitis of right lower limb: Secondary | ICD-10-CM | POA: Diagnosis not present

## 2024-07-23 DIAGNOSIS — F333 Major depressive disorder, recurrent, severe with psychotic symptoms: Secondary | ICD-10-CM | POA: Diagnosis not present

## 2024-08-10 ENCOUNTER — Other Ambulatory Visit (HOSPITAL_BASED_OUTPATIENT_CLINIC_OR_DEPARTMENT_OTHER): Payer: Self-pay | Admitting: Obstetrics & Gynecology

## 2024-08-10 ENCOUNTER — Ambulatory Visit (HOSPITAL_BASED_OUTPATIENT_CLINIC_OR_DEPARTMENT_OTHER)
Admission: RE | Admit: 2024-08-10 | Discharge: 2024-08-10 | Disposition: A | Discharge status: Home / Self Care | Source: Ambulatory Visit

## 2024-08-10 ENCOUNTER — Encounter (HOSPITAL_BASED_OUTPATIENT_CLINIC_OR_DEPARTMENT_OTHER): Payer: Self-pay | Admitting: Radiology

## 2024-08-10 DIAGNOSIS — Z1231 Encounter for screening mammogram for malignant neoplasm of breast: Secondary | ICD-10-CM | POA: Diagnosis present
